# Patient Record
Sex: Female | Born: 1940 | Race: White | Hispanic: No | Smoking: Never smoker
Health system: Southern US, Community
[De-identification: ages and names within clinical notes are randomized; demographics above are authoritative.]

## PROBLEM LIST (undated history)

## (undated) DIAGNOSIS — I10 Essential (primary) hypertension: Secondary | ICD-10-CM

## (undated) DIAGNOSIS — G4730 Sleep apnea, unspecified: Secondary | ICD-10-CM

## (undated) DIAGNOSIS — F329 Major depressive disorder, single episode, unspecified: Secondary | ICD-10-CM

## (undated) DIAGNOSIS — E039 Hypothyroidism, unspecified: Secondary | ICD-10-CM

## (undated) DIAGNOSIS — I509 Heart failure, unspecified: Secondary | ICD-10-CM

## (undated) DIAGNOSIS — M1990 Unspecified osteoarthritis, unspecified site: Secondary | ICD-10-CM

## (undated) DIAGNOSIS — Z9981 Dependence on supplemental oxygen: Secondary | ICD-10-CM

## (undated) DIAGNOSIS — F419 Anxiety disorder, unspecified: Secondary | ICD-10-CM

## (undated) DIAGNOSIS — D649 Anemia, unspecified: Secondary | ICD-10-CM

## (undated) DIAGNOSIS — E119 Type 2 diabetes mellitus without complications: Secondary | ICD-10-CM

## (undated) DIAGNOSIS — M109 Gout, unspecified: Secondary | ICD-10-CM

## (undated) DIAGNOSIS — M3500 Sicca syndrome, unspecified: Secondary | ICD-10-CM

## (undated) DIAGNOSIS — G2581 Restless legs syndrome: Secondary | ICD-10-CM

## (undated) DIAGNOSIS — F32A Depression, unspecified: Secondary | ICD-10-CM

## (undated) DIAGNOSIS — G629 Polyneuropathy, unspecified: Secondary | ICD-10-CM

## (undated) DIAGNOSIS — N189 Chronic kidney disease, unspecified: Secondary | ICD-10-CM

## (undated) HISTORY — PX: CHOLECYSTECTOMY: SHX55

## (undated) HISTORY — PX: FOOT SURGERY: SHX648

## (undated) HISTORY — PX: BACK SURGERY: SHX140

---

## 2006-10-03 ENCOUNTER — Other Ambulatory Visit: Payer: Self-pay

## 2006-10-03 ENCOUNTER — Inpatient Hospital Stay: Payer: Self-pay | Admitting: Internal Medicine

## 2007-03-02 ENCOUNTER — Ambulatory Visit: Payer: Self-pay | Admitting: Oncology

## 2007-03-08 LAB — CBC WITH DIFFERENTIAL (CANCER CENTER ONLY)
BASO%: 0.3 % (ref 0.0–2.0)
LYMPH#: 1.1 10*3/uL (ref 0.9–3.3)
LYMPH%: 16.8 % (ref 14.0–48.0)
MONO#: 0.3 10*3/uL (ref 0.1–0.9)
Platelets: 353 10*3/uL (ref 145–400)
RDW: 13 % (ref 10.5–14.6)
WBC: 6.3 10*3/uL (ref 3.9–10.0)

## 2007-03-10 LAB — COMPREHENSIVE METABOLIC PANEL
ALT: 14 U/L (ref 0–35)
Albumin: 3.7 g/dL (ref 3.5–5.2)
CO2: 27 mEq/L (ref 19–32)
Calcium: 9.9 mg/dL (ref 8.4–10.5)
Chloride: 105 mEq/L (ref 96–112)
Creatinine, Ser: 1.58 mg/dL — ABNORMAL HIGH (ref 0.40–1.20)
Potassium: 5.1 mEq/L (ref 3.5–5.3)

## 2007-03-10 LAB — BETA 2 MICROGLOBULIN, SERUM: Beta-2 Microglobulin: 3.96 mg/L — ABNORMAL HIGH (ref 1.01–1.73)

## 2007-03-10 LAB — RETICULOCYTES (CHCC)
ABS Retic: 21.7 10*3/uL (ref 19.0–186.0)
RBC.: 3.62 MIL/uL — ABNORMAL LOW (ref 3.87–5.11)

## 2007-03-10 LAB — CEA: CEA: 3.4 ng/mL (ref 0.0–5.0)

## 2007-03-10 LAB — PROTEIN ELECTROPHORESIS, SERUM
Albumin ELP: 54.8 % — ABNORMAL LOW (ref 55.8–66.1)
Alpha-1-Globulin: 4 % (ref 2.9–4.9)
Gamma Globulin: 18 % (ref 11.1–18.8)

## 2007-03-10 LAB — IRON AND TIBC: %SAT: 14 % — ABNORMAL LOW (ref 20–55)

## 2007-03-10 LAB — FOLATE: Folate: >20 ng/mL

## 2007-03-10 LAB — LACTATE DEHYDROGENASE: LDH: 168 U/L (ref 94–250)

## 2007-03-10 LAB — SEDIMENTATION RATE: Sed Rate: 38 mm/hr — ABNORMAL HIGH (ref 0–22)

## 2007-03-10 LAB — FERRITIN: Ferritin: 58 ng/mL (ref 10–291)

## 2007-03-10 LAB — VITAMIN B12: Vitamin B-12: 332 pg/mL (ref 211–911)

## 2007-03-17 ENCOUNTER — Ambulatory Visit: Payer: Self-pay | Admitting: Oncology

## 2007-03-22 ENCOUNTER — Ambulatory Visit: Payer: Self-pay | Admitting: Pain Medicine

## 2007-03-24 ENCOUNTER — Ambulatory Visit: Payer: Self-pay | Admitting: Nephrology

## 2007-03-24 ENCOUNTER — Ambulatory Visit: Payer: Self-pay | Admitting: Ophthalmology

## 2007-03-29 ENCOUNTER — Encounter (HOSPITAL_COMMUNITY): Admission: RE | Admit: 2007-03-29 | Discharge: 2007-06-13 | Payer: Self-pay | Admitting: Oncology

## 2007-03-29 ENCOUNTER — Ambulatory Visit: Payer: Self-pay | Admitting: Pain Medicine

## 2007-03-29 LAB — CBC WITH DIFFERENTIAL (CANCER CENTER ONLY)
BASO#: 0 10*3/uL (ref 0.0–0.2)
EOS%: 2.7 % (ref 0.0–7.0)
HGB: 10.1 g/dL — ABNORMAL LOW (ref 11.6–15.9)
MCH: 29.4 pg (ref 26.0–34.0)
MCHC: 33.8 g/dL (ref 32.0–36.0)
MONO%: 6.6 % (ref 0.0–13.0)
NEUT#: 3.2 10*3/uL (ref 1.5–6.5)
Platelets: 426 10*3/uL — ABNORMAL HIGH (ref 145–400)

## 2007-03-29 LAB — BASIC METABOLIC PANEL - CANCER CENTER ONLY
CO2: 33 mEq/L (ref 18–33)
Calcium: 10.3 mg/dL (ref 8.0–10.3)
Creat: 1.9 mg/dl — ABNORMAL HIGH (ref 0.6–1.2)
Glucose, Bld: 91 mg/dL (ref 73–118)
Sodium: 138 mEq/L (ref 128–145)

## 2007-04-04 LAB — TYPE & CROSSMATCH - CHCC SATELLITE

## 2007-04-10 ENCOUNTER — Ambulatory Visit: Payer: Self-pay | Admitting: Pain Medicine

## 2007-04-18 ENCOUNTER — Ambulatory Visit: Payer: Self-pay | Admitting: Oncology

## 2007-04-19 LAB — CBC WITH DIFFERENTIAL (CANCER CENTER ONLY)
BASO%: 0.5 % (ref 0.0–2.0)
EOS%: 3.2 % (ref 0.0–7.0)
Eosinophils Absolute: 0.2 10*3/uL (ref 0.0–0.5)
MCH: 29 pg (ref 26.0–34.0)
MONO%: 6.3 % (ref 0.0–13.0)
NEUT#: 3.4 10*3/uL (ref 1.5–6.5)
Platelets: 341 10*3/uL (ref 145–400)
RBC: 4.24 10*6/uL (ref 3.70–5.32)
RDW: 13.3 % (ref 10.5–14.6)

## 2007-04-19 LAB — BASIC METABOLIC PANEL - CANCER CENTER ONLY
BUN, Bld: 21 mg/dL (ref 7–22)
Calcium: 9.8 mg/dL (ref 8.0–10.3)
Chloride: 102 mEq/L (ref 98–108)
Creat: 1.6 mg/dl — ABNORMAL HIGH (ref 0.6–1.2)

## 2007-04-27 LAB — CBC WITH DIFFERENTIAL (CANCER CENTER ONLY)
BASO%: 0.7 % (ref 0.0–2.0)
Eosinophils Absolute: 0.1 10*3/uL (ref 0.0–0.5)
LYMPH#: 2 10*3/uL (ref 0.9–3.3)
MCV: 88 fL (ref 81–101)
MONO#: 0.3 10*3/uL (ref 0.1–0.9)
NEUT#: 3.1 10*3/uL (ref 1.5–6.5)
Platelets: 393 10*3/uL (ref 145–400)
RBC: 4.41 10*6/uL (ref 3.70–5.32)
WBC: 5.5 10*3/uL (ref 3.9–10.0)

## 2007-05-01 ENCOUNTER — Other Ambulatory Visit: Payer: Self-pay

## 2007-05-01 ENCOUNTER — Ambulatory Visit: Payer: Self-pay | Admitting: Pain Medicine

## 2007-05-18 LAB — CBC WITH DIFFERENTIAL (CANCER CENTER ONLY)
Eosinophils Absolute: 0.2 10*3/uL (ref 0.0–0.5)
HCT: 32.3 % — ABNORMAL LOW (ref 34.8–46.6)
HGB: 10.7 g/dL — ABNORMAL LOW (ref 11.6–15.9)
LYMPH#: 2 10*3/uL (ref 0.9–3.3)
LYMPH%: 37.1 % (ref 14.0–48.0)
MCV: 87 fL (ref 81–101)
MONO#: 0.4 10*3/uL (ref 0.1–0.9)
NEUT%: 50.7 % (ref 39.6–80.0)
RBC: 3.7 10*6/uL (ref 3.70–5.32)
WBC: 5.5 10*3/uL (ref 3.9–10.0)

## 2007-05-25 ENCOUNTER — Ambulatory Visit: Payer: Self-pay | Admitting: Pain Medicine

## 2007-06-07 ENCOUNTER — Ambulatory Visit: Payer: Self-pay | Admitting: Oncology

## 2007-06-14 ENCOUNTER — Ambulatory Visit: Payer: Self-pay | Admitting: Physician Assistant

## 2007-06-29 ENCOUNTER — Ambulatory Visit: Payer: Self-pay | Admitting: Physician Assistant

## 2007-07-02 ENCOUNTER — Emergency Department: Payer: Self-pay | Admitting: Emergency Medicine

## 2007-07-12 LAB — COMPREHENSIVE METABOLIC PANEL
ALT: <8 U/L (ref 0–35)
AST: 10 U/L (ref 0–37)
Albumin: 4.1 g/dL (ref 3.5–5.2)
Alkaline Phosphatase: 64 U/L (ref 39–117)
Calcium: 9 mg/dL (ref 8.4–10.5)
Chloride: 102 mEq/L (ref 96–112)
Potassium: 4.4 mEq/L (ref 3.5–5.3)
Sodium: 140 mEq/L (ref 135–145)
Total Protein: 7 g/dL (ref 6.0–8.3)

## 2007-07-12 LAB — CBC WITH DIFFERENTIAL (CANCER CENTER ONLY)
BASO%: 0.3 % (ref 0.0–2.0)
HCT: 30.8 % — ABNORMAL LOW (ref 34.8–46.6)
HGB: 10.4 g/dL — ABNORMAL LOW (ref 11.6–15.9)
LYMPH#: 1.9 10*3/uL (ref 0.9–3.3)
MONO#: 0.4 10*3/uL (ref 0.1–0.9)
NEUT%: 69 % (ref 39.6–80.0)
RDW: 14.5 % (ref 10.5–14.6)
WBC: 8.1 10*3/uL (ref 3.9–10.0)

## 2007-07-26 ENCOUNTER — Ambulatory Visit: Payer: Self-pay | Admitting: Physician Assistant

## 2007-08-01 ENCOUNTER — Ambulatory Visit: Payer: Self-pay | Admitting: Oncology

## 2007-08-02 LAB — CBC WITH DIFFERENTIAL (CANCER CENTER ONLY)
BASO#: 0 10*3/uL (ref 0.0–0.2)
EOS%: 3.3 % (ref 0.0–7.0)
HCT: 34.4 % — ABNORMAL LOW (ref 34.8–46.6)
HGB: 11.2 g/dL — ABNORMAL LOW (ref 11.6–15.9)
MCH: 28.7 pg (ref 26.0–34.0)
MCHC: 32.5 g/dL (ref 32.0–36.0)
MONO%: 6.2 % (ref 0.0–13.0)
NEUT%: 46.6 % (ref 39.6–80.0)

## 2007-08-23 LAB — CBC WITH DIFFERENTIAL (CANCER CENTER ONLY)
BASO#: 0 10*3/uL (ref 0.0–0.2)
BASO%: 0.5 % (ref 0.0–2.0)
EOS%: 3.9 % (ref 0.0–7.0)
LYMPH#: 1.6 10*3/uL (ref 0.9–3.3)
MCH: 28.4 pg (ref 26.0–34.0)
MCHC: 32 g/dL (ref 32.0–36.0)
MONO%: 8 % (ref 0.0–13.0)
NEUT#: 2.8 10*3/uL (ref 1.5–6.5)
Platelets: 290 10*3/uL (ref 145–400)
RDW: 14.5 % (ref 10.5–14.6)

## 2007-08-28 ENCOUNTER — Ambulatory Visit: Payer: Self-pay | Admitting: Physician Assistant

## 2007-09-09 ENCOUNTER — Ambulatory Visit: Payer: Self-pay | Admitting: Pain Medicine

## 2007-09-13 LAB — CBC WITH DIFFERENTIAL (CANCER CENTER ONLY)
BASO#: 0 10*3/uL (ref 0.0–0.2)
Eosinophils Absolute: 0.1 10*3/uL (ref 0.0–0.5)
HGB: 11.5 g/dL — ABNORMAL LOW (ref 11.6–15.9)
LYMPH#: 2.3 10*3/uL (ref 0.9–3.3)
MCH: 28.8 pg (ref 26.0–34.0)
MONO%: 6.8 % (ref 0.0–13.0)
NEUT#: 3.9 10*3/uL (ref 1.5–6.5)
Platelets: 329 10*3/uL (ref 145–400)
RBC: 3.99 10*6/uL (ref 3.70–5.32)
WBC: 6.9 10*3/uL (ref 3.9–10.0)

## 2007-09-22 ENCOUNTER — Ambulatory Visit: Payer: Self-pay | Admitting: Physician Assistant

## 2007-10-03 ENCOUNTER — Ambulatory Visit: Payer: Self-pay | Admitting: Oncology

## 2007-10-04 LAB — CBC WITH DIFFERENTIAL (CANCER CENTER ONLY)
BASO#: 0 10*3/uL (ref 0.0–0.2)
Eosinophils Absolute: 0.1 10*3/uL (ref 0.0–0.5)
HGB: 12.2 g/dL (ref 11.6–15.9)
MCV: 88 fL (ref 81–101)
MONO#: 0.4 10*3/uL (ref 0.1–0.9)
NEUT#: 3.3 10*3/uL (ref 1.5–6.5)
Platelets: 280 10*3/uL (ref 145–400)
RBC: 4.23 10*6/uL (ref 3.70–5.32)
WBC: 5 10*3/uL (ref 3.9–10.0)

## 2007-10-09 ENCOUNTER — Encounter: Payer: Self-pay | Admitting: Physician Assistant

## 2007-10-09 ENCOUNTER — Ambulatory Visit: Payer: Self-pay | Admitting: Pain Medicine

## 2007-10-10 ENCOUNTER — Ambulatory Visit: Payer: Self-pay | Admitting: Pain Medicine

## 2007-10-24 LAB — CBC WITH DIFFERENTIAL (CANCER CENTER ONLY)
BASO#: 0 10*3/uL (ref 0.0–0.2)
Eosinophils Absolute: 0.1 10*3/uL (ref 0.0–0.5)
HCT: 35.9 % (ref 34.8–46.6)
LYMPH%: 29.7 % (ref 14.0–48.0)
MCH: 28.7 pg (ref 26.0–34.0)
MCV: 87 fL (ref 81–101)
MONO#: 0.2 10*3/uL (ref 0.1–0.9)
NEUT%: 65.2 % (ref 39.6–80.0)
RBC: 4.12 10*6/uL (ref 3.70–5.32)
WBC: 5.6 10*3/uL (ref 3.9–10.0)

## 2007-10-24 LAB — IRON AND TIBC: TIBC: 256 ug/dL (ref 250–470)

## 2007-10-25 ENCOUNTER — Ambulatory Visit: Payer: Self-pay | Admitting: Physician Assistant

## 2007-11-16 LAB — CBC WITH DIFFERENTIAL (CANCER CENTER ONLY)
BASO#: 0 10*3/uL (ref 0.0–0.2)
BASO%: 0.5 % (ref 0.0–2.0)
HCT: 34.5 % — ABNORMAL LOW (ref 34.8–46.6)
HGB: 11.4 g/dL — ABNORMAL LOW (ref 11.6–15.9)
LYMPH#: 1.9 10*3/uL (ref 0.9–3.3)
LYMPH%: 31.1 % (ref 14.0–48.0)
MCHC: 33.1 g/dL (ref 32.0–36.0)
MCV: 87 fL (ref 81–101)
MONO#: 0.5 10*3/uL (ref 0.1–0.9)
NEUT%: 59.3 % (ref 39.6–80.0)
RDW: 14.1 % (ref 10.5–14.6)

## 2007-11-21 ENCOUNTER — Ambulatory Visit: Payer: Self-pay | Admitting: Pain Medicine

## 2007-12-01 ENCOUNTER — Ambulatory Visit: Payer: Self-pay | Admitting: Oncology

## 2007-12-05 ENCOUNTER — Ambulatory Visit: Payer: Self-pay | Admitting: Pain Medicine

## 2007-12-06 LAB — CBC WITH DIFFERENTIAL (CANCER CENTER ONLY)
BASO#: 0 10*3/uL (ref 0.0–0.2)
BASO%: 0.2 % (ref 0.0–2.0)
EOS%: 0.7 % (ref 0.0–7.0)
HCT: 33.5 % — ABNORMAL LOW (ref 34.8–46.6)
HGB: 11 g/dL — ABNORMAL LOW (ref 11.6–15.9)
LYMPH#: 1 10*3/uL (ref 0.9–3.3)
LYMPH%: 14.5 % (ref 14.0–48.0)
MCH: 29.3 pg (ref 26.0–34.0)
MCHC: 32.9 g/dL (ref 32.0–36.0)
MONO%: 5.1 % (ref 0.0–13.0)
NEUT%: 79.5 % (ref 39.6–80.0)
RDW: 16.2 % — ABNORMAL HIGH (ref 10.5–14.6)

## 2007-12-19 ENCOUNTER — Ambulatory Visit: Payer: Self-pay | Admitting: Gastroenterology

## 2007-12-20 ENCOUNTER — Ambulatory Visit: Payer: Self-pay | Admitting: Physician Assistant

## 2007-12-28 LAB — CBC WITH DIFFERENTIAL (CANCER CENTER ONLY)
BASO%: 0.5 % (ref 0.0–2.0)
EOS%: 1 % (ref 0.0–7.0)
LYMPH%: 33.3 % (ref 14.0–48.0)
MCH: 30 pg (ref 26.0–34.0)
MCV: 90 fL (ref 81–101)
MONO%: 5.4 % (ref 0.0–13.0)
Platelets: 369 10*3/uL (ref 145–400)
RDW: 15 % — ABNORMAL HIGH (ref 10.5–14.6)

## 2008-01-12 ENCOUNTER — Ambulatory Visit: Payer: Self-pay | Admitting: Oncology

## 2008-01-17 LAB — CBC WITH DIFFERENTIAL (CANCER CENTER ONLY)
BASO#: 0 10*3/uL (ref 0.0–0.2)
Eosinophils Absolute: 0.1 10*3/uL (ref 0.0–0.5)
HCT: 30.4 % — ABNORMAL LOW (ref 34.8–46.6)
HGB: 10.1 g/dL — ABNORMAL LOW (ref 11.6–15.9)
MCH: 29.6 pg (ref 26.0–34.0)
MCV: 90 fL (ref 81–101)
MONO%: 6 % (ref 0.0–13.0)
NEUT#: 6.2 10*3/uL (ref 1.5–6.5)
NEUT%: 71.5 % (ref 39.6–80.0)
RBC: 3.39 10*6/uL — ABNORMAL LOW (ref 3.70–5.32)

## 2008-01-25 ENCOUNTER — Ambulatory Visit: Payer: Self-pay | Admitting: Physician Assistant

## 2008-02-01 ENCOUNTER — Ambulatory Visit: Payer: Self-pay | Admitting: Pain Medicine

## 2008-02-07 LAB — CBC WITH DIFFERENTIAL (CANCER CENTER ONLY)
BASO%: 0.5 % (ref 0.0–2.0)
EOS%: 1.3 % (ref 0.0–7.0)
HCT: 38 % (ref 34.8–46.6)
LYMPH#: 1.4 10*3/uL (ref 0.9–3.3)
LYMPH%: 14.9 % (ref 14.0–48.0)
MCHC: 33 g/dL (ref 32.0–36.0)
MCV: 90 fL (ref 81–101)
NEUT%: 79 % (ref 39.6–80.0)
Platelets: 340 10*3/uL (ref 145–400)
RDW: 14 % (ref 10.5–14.6)

## 2008-02-15 ENCOUNTER — Ambulatory Visit: Payer: Self-pay | Admitting: Physician Assistant

## 2008-02-26 ENCOUNTER — Ambulatory Visit: Payer: Self-pay | Admitting: Oncology

## 2008-02-27 LAB — CBC WITH DIFFERENTIAL (CANCER CENTER ONLY)
BASO#: 0.1 10*3/uL (ref 0.0–0.2)
Eosinophils Absolute: 0.1 10*3/uL (ref 0.0–0.5)
HGB: 10.6 g/dL — ABNORMAL LOW (ref 11.6–15.9)
LYMPH%: 9 % — ABNORMAL LOW (ref 14.0–48.0)
MCH: 29.3 pg (ref 26.0–34.0)
MCHC: 33.4 g/dL (ref 32.0–36.0)
MCV: 88 fL (ref 81–101)
MONO%: 3.5 % (ref 0.0–13.0)
Platelets: 292 10*3/uL (ref 145–400)
RBC: 3.6 10*6/uL — ABNORMAL LOW (ref 3.70–5.32)

## 2008-02-28 ENCOUNTER — Inpatient Hospital Stay: Payer: Self-pay | Admitting: Internal Medicine

## 2008-02-28 ENCOUNTER — Other Ambulatory Visit: Payer: Self-pay

## 2008-03-28 ENCOUNTER — Ambulatory Visit: Payer: Self-pay | Admitting: Pain Medicine

## 2008-04-02 ENCOUNTER — Ambulatory Visit: Payer: Self-pay | Admitting: Internal Medicine

## 2008-04-10 LAB — CBC WITH DIFFERENTIAL (CANCER CENTER ONLY)
BASO#: 0 10*3/uL (ref 0.0–0.2)
Eosinophils Absolute: 0.1 10*3/uL (ref 0.0–0.5)
HGB: 10.4 g/dL — ABNORMAL LOW (ref 11.6–15.9)
MCH: 29.1 pg (ref 26.0–34.0)
MONO%: 7 % (ref 0.0–13.0)
NEUT#: 4.4 10*3/uL (ref 1.5–6.5)
RBC: 3.58 10*6/uL — ABNORMAL LOW (ref 3.70–5.32)

## 2008-04-29 ENCOUNTER — Ambulatory Visit: Payer: Self-pay | Admitting: Oncology

## 2008-04-30 ENCOUNTER — Ambulatory Visit: Payer: Self-pay | Admitting: Physician Assistant

## 2008-05-01 LAB — BASIC METABOLIC PANEL
BUN: 32 mg/dL — ABNORMAL HIGH (ref 6–23)
Calcium: 9.3 mg/dL (ref 8.4–10.5)
Glucose, Bld: 58 mg/dL — ABNORMAL LOW (ref 70–99)
Potassium: 3.9 mEq/L (ref 3.5–5.3)

## 2008-05-01 LAB — CBC WITH DIFFERENTIAL (CANCER CENTER ONLY)
BASO#: 0 10*3/uL (ref 0.0–0.2)
EOS%: 1.4 % (ref 0.0–7.0)
Eosinophils Absolute: 0.1 10*3/uL (ref 0.0–0.5)
HGB: 10.8 g/dL — ABNORMAL LOW (ref 11.6–15.9)
LYMPH#: 0.9 10*3/uL (ref 0.9–3.3)
MCHC: 33.5 g/dL (ref 32.0–36.0)
NEUT#: 3.5 10*3/uL (ref 1.5–6.5)
RBC: 3.65 10*6/uL — ABNORMAL LOW (ref 3.70–5.32)
WBC: 4.8 10*3/uL (ref 3.9–10.0)

## 2008-05-01 LAB — IRON AND TIBC: Iron: 28 ug/dL — ABNORMAL LOW (ref 42–145)

## 2008-05-01 LAB — FERRITIN: Ferritin: 205 ng/mL (ref 10–291)

## 2008-05-22 LAB — CBC WITH DIFFERENTIAL (CANCER CENTER ONLY)
BASO#: 0 10*3/uL (ref 0.0–0.2)
BASO%: 0.6 % (ref 0.0–2.0)
Eosinophils Absolute: 0.1 10*3/uL (ref 0.0–0.5)
HCT: 34.3 % — ABNORMAL LOW (ref 34.8–46.6)
HGB: 11.1 g/dL — ABNORMAL LOW (ref 11.6–15.9)
LYMPH%: 33.2 % (ref 14.0–48.0)
MCH: 28.7 pg (ref 26.0–34.0)
MCV: 89 fL (ref 81–101)
MONO%: 10 % (ref 0.0–13.0)
NEUT%: 54.8 % (ref 39.6–80.0)
Platelets: 303 10*3/uL (ref 145–400)
RBC: 3.86 10*6/uL (ref 3.70–5.32)

## 2008-05-30 ENCOUNTER — Ambulatory Visit: Payer: Self-pay | Admitting: Physician Assistant

## 2008-06-04 ENCOUNTER — Ambulatory Visit: Payer: Self-pay | Admitting: Pain Medicine

## 2008-06-06 ENCOUNTER — Ambulatory Visit: Payer: Self-pay | Admitting: Pain Medicine

## 2008-06-07 ENCOUNTER — Ambulatory Visit: Payer: Self-pay | Admitting: Pain Medicine

## 2008-06-12 LAB — CBC WITH DIFFERENTIAL (CANCER CENTER ONLY)
BASO%: 0.4 % (ref 0.0–2.0)
Eosinophils Absolute: 0.1 10*3/uL (ref 0.0–0.5)
HCT: 35.9 % (ref 34.8–46.6)
HGB: 11.7 g/dL (ref 11.6–15.9)
LYMPH#: 1.8 10*3/uL (ref 0.9–3.3)
MONO#: 0.3 10*3/uL (ref 0.1–0.9)
NEUT%: 67.8 % (ref 39.6–80.0)
RBC: 4.09 10*6/uL (ref 3.70–5.32)
RDW: 14.4 % (ref 10.5–14.6)
WBC: 7.1 10*3/uL (ref 3.9–10.0)

## 2008-06-19 ENCOUNTER — Ambulatory Visit: Payer: Self-pay | Admitting: Physician Assistant

## 2008-07-02 ENCOUNTER — Ambulatory Visit: Payer: Self-pay | Admitting: Oncology

## 2008-07-04 LAB — CBC WITH DIFFERENTIAL (CANCER CENTER ONLY)
BASO#: 0 10*3/uL (ref 0.0–0.2)
EOS%: 1.6 % (ref 0.0–7.0)
Eosinophils Absolute: 0.1 10*3/uL (ref 0.0–0.5)
HCT: 34.8 % (ref 34.8–46.6)
HGB: 11.2 g/dL — ABNORMAL LOW (ref 11.6–15.9)
LYMPH#: 1.4 10*3/uL (ref 0.9–3.3)
MONO#: 0.3 10*3/uL (ref 0.1–0.9)
NEUT#: 3.4 10*3/uL (ref 1.5–6.5)
NEUT%: 65.4 % (ref 39.6–80.0)
RBC: 3.99 10*6/uL (ref 3.70–5.32)
WBC: 5.2 10*3/uL (ref 3.9–10.0)

## 2008-07-24 LAB — CBC WITH DIFFERENTIAL (CANCER CENTER ONLY)
BASO#: 0 10*3/uL (ref 0.0–0.2)
EOS%: 1.8 % (ref 0.0–7.0)
HCT: 35.5 % (ref 34.8–46.6)
HGB: 11.6 g/dL (ref 11.6–15.9)
LYMPH#: 1.6 10*3/uL (ref 0.9–3.3)
MCHC: 32.8 g/dL (ref 32.0–36.0)
MCV: 84 fL (ref 81–101)
MONO#: 0.4 10*3/uL (ref 0.1–0.9)
NEUT%: 63.8 % (ref 39.6–80.0)

## 2008-07-31 ENCOUNTER — Ambulatory Visit: Payer: Self-pay | Admitting: Physician Assistant

## 2008-08-14 LAB — CBC WITH DIFFERENTIAL/PLATELET
BASO%: 0.6 % (ref 0.0–2.0)
Basophils Absolute: 0.1 10*3/uL (ref 0.0–0.1)
EOS%: 0 % (ref 0.0–7.0)
Eosinophils Absolute: 0 10*3/uL (ref 0.0–0.5)
HCT: 37.2 % (ref 34.8–46.6)
HGB: 11.7 g/dL (ref 11.6–15.9)
LYMPH%: 26.2 % (ref 14.0–48.0)
MCH: 27.1 pg (ref 26.0–34.0)
MCHC: 31.5 g/dL — ABNORMAL LOW (ref 32.0–36.0)
MCV: 86.1 fL (ref 81.0–101.0)
MONO#: 0.8 10*3/uL (ref 0.1–0.9)
MONO%: 8.6 % (ref 0.0–13.0)
NEUT#: 6.1 10*3/uL (ref 1.5–6.5)
NEUT%: 64.6 % (ref 39.6–76.8)
Platelets: 320 10*3/uL (ref 145–400)
RBC: 4.32 10*6/uL (ref 3.70–5.32)
RDW: 18.4 % — ABNORMAL HIGH (ref 11.3–14.5)
WBC: 9.4 10*3/uL (ref 3.9–10.0)
lymph#: 2.5 10*3/uL (ref 0.9–3.3)

## 2008-09-03 ENCOUNTER — Ambulatory Visit: Payer: Self-pay | Admitting: Oncology

## 2008-09-04 LAB — CBC WITH DIFFERENTIAL (CANCER CENTER ONLY)
BASO%: 0.5 % (ref 0.0–2.0)
EOS%: 2.8 % (ref 0.0–7.0)
LYMPH#: 1.6 10*3/uL (ref 0.9–3.3)
MCHC: 32.6 g/dL (ref 32.0–36.0)
NEUT#: 2.9 10*3/uL (ref 1.5–6.5)
RDW: 15.4 % — ABNORMAL HIGH (ref 10.5–14.6)

## 2008-09-25 LAB — CBC WITH DIFFERENTIAL (CANCER CENTER ONLY)
BASO%: 0.6 % (ref 0.0–2.0)
Eosinophils Absolute: 0.1 10*3/uL (ref 0.0–0.5)
LYMPH#: 1.9 10*3/uL (ref 0.9–3.3)
LYMPH%: 27.4 % (ref 14.0–48.0)
MONO#: 0.5 10*3/uL (ref 0.1–0.9)
NEUT#: 4.4 10*3/uL (ref 1.5–6.5)
Platelets: 283 10*3/uL (ref 145–400)
RBC: 4.02 10*6/uL (ref 3.70–5.32)
RDW: 17.1 % — ABNORMAL HIGH (ref 10.5–14.6)
WBC: 6.9 10*3/uL (ref 3.9–10.0)

## 2008-10-01 ENCOUNTER — Ambulatory Visit: Payer: Self-pay | Admitting: Physician Assistant

## 2008-10-16 LAB — CBC WITH DIFFERENTIAL (CANCER CENTER ONLY)
BASO#: 0 10*3/uL (ref 0.0–0.2)
EOS%: 1.8 % (ref 0.0–7.0)
Eosinophils Absolute: 0.1 10*3/uL (ref 0.0–0.5)
LYMPH%: 31.1 % (ref 14.0–48.0)
MCH: 26.8 pg (ref 26.0–34.0)
MCHC: 32 g/dL (ref 32.0–36.0)
MCV: 84 fL (ref 81–101)
MONO%: 8.5 % (ref 0.0–13.0)
NEUT#: 3.2 10*3/uL (ref 1.5–6.5)
Platelets: 260 10*3/uL (ref 145–400)
RBC: 4.46 10*6/uL (ref 3.70–5.32)

## 2008-11-05 ENCOUNTER — Ambulatory Visit: Payer: Self-pay | Admitting: Oncology

## 2008-11-15 LAB — CBC WITH DIFFERENTIAL (CANCER CENTER ONLY)
BASO#: 0 10*3/uL (ref 0.0–0.2)
Eosinophils Absolute: 0.2 10*3/uL (ref 0.0–0.5)
HCT: 33.6 % — ABNORMAL LOW (ref 34.8–46.6)
HGB: 10.9 g/dL — ABNORMAL LOW (ref 11.6–15.9)
LYMPH#: 1.8 10*3/uL (ref 0.9–3.3)
MCHC: 32.4 g/dL (ref 32.0–36.0)
MONO#: 0.4 10*3/uL (ref 0.1–0.9)
NEUT#: 3.4 10*3/uL (ref 1.5–6.5)
NEUT%: 58.3 % (ref 39.6–80.0)
RBC: 4.03 10*6/uL (ref 3.70–5.32)
WBC: 5.9 10*3/uL (ref 3.9–10.0)

## 2008-11-15 LAB — BASIC METABOLIC PANEL - CANCER CENTER ONLY
CO2: 31 mEq/L (ref 18–33)
Calcium: 9.9 mg/dL (ref 8.0–10.3)
Creat: 2.1 mg/dl — ABNORMAL HIGH (ref 0.6–1.2)
Glucose, Bld: 97 mg/dL (ref 73–118)

## 2008-11-28 ENCOUNTER — Ambulatory Visit: Payer: Self-pay | Admitting: Gastroenterology

## 2008-12-05 LAB — CBC WITH DIFFERENTIAL (CANCER CENTER ONLY)
BASO%: 0.6 % (ref 0.0–2.0)
LYMPH%: 39.4 % (ref 14.0–48.0)
MCH: 26.6 pg (ref 26.0–34.0)
MCV: 84 fL (ref 81–101)
MONO%: 7.1 % (ref 0.0–13.0)
NEUT#: 2.3 10*3/uL (ref 1.5–6.5)
Platelets: 421 10*3/uL — ABNORMAL HIGH (ref 145–400)
RBC: 4.07 10*6/uL (ref 3.70–5.32)
RDW: 14.2 % (ref 10.5–14.6)
WBC: 4.6 10*3/uL (ref 3.9–10.0)

## 2008-12-10 ENCOUNTER — Ambulatory Visit: Payer: Self-pay | Admitting: Physician Assistant

## 2008-12-25 ENCOUNTER — Ambulatory Visit: Payer: Self-pay | Admitting: Oncology

## 2008-12-26 LAB — CBC WITH DIFFERENTIAL (CANCER CENTER ONLY)
BASO#: 0 10*3/uL (ref 0.0–0.2)
EOS%: 4.1 % (ref 0.0–7.0)
Eosinophils Absolute: 0.3 10*3/uL (ref 0.0–0.5)
HCT: 36.7 % (ref 34.8–46.6)
HGB: 11.8 g/dL (ref 11.6–15.9)
MCH: 26.7 pg (ref 26.0–34.0)
MCHC: 32.1 g/dL (ref 32.0–36.0)
MCV: 83 fL (ref 81–101)
MONO%: 6.2 % (ref 0.0–13.0)
NEUT%: 66 % (ref 39.6–80.0)
RBC: 4.4 10*6/uL (ref 3.70–5.32)

## 2009-01-16 LAB — CBC WITH DIFFERENTIAL (CANCER CENTER ONLY)
BASO%: 0.6 % (ref 0.0–2.0)
HCT: 37.2 % (ref 34.8–46.6)
HGB: 11.8 g/dL (ref 11.6–15.9)
LYMPH#: 2.1 10*3/uL (ref 0.9–3.3)
MONO#: 0.4 10*3/uL (ref 0.1–0.9)
NEUT#: 2.3 10*3/uL (ref 1.5–6.5)
NEUT%: 46.3 % (ref 39.6–80.0)
RDW: 14.2 % (ref 10.5–14.6)
WBC: 5 10*3/uL (ref 3.9–10.0)

## 2009-02-06 LAB — CBC WITH DIFFERENTIAL (CANCER CENTER ONLY)
BASO%: 0.6 % (ref 0.0–2.0)
LYMPH%: 29.9 % (ref 14.0–48.0)
MCH: 26 pg (ref 26.0–34.0)
MCHC: 32.5 g/dL (ref 32.0–36.0)
MCV: 80 fL — ABNORMAL LOW (ref 81–101)
MONO%: 6 % (ref 0.0–13.0)
Platelets: 248 10*3/uL (ref 145–400)
RDW: 15.5 % — ABNORMAL HIGH (ref 10.5–14.6)

## 2009-02-24 ENCOUNTER — Ambulatory Visit: Payer: Self-pay | Admitting: Oncology

## 2009-02-27 LAB — CBC WITH DIFFERENTIAL (CANCER CENTER ONLY)
BASO%: 0.3 % (ref 0.0–2.0)
EOS%: 1.1 % (ref 0.0–7.0)
LYMPH#: 1.4 10*3/uL (ref 0.9–3.3)
LYMPH%: 25.5 % (ref 14.0–48.0)
MONO#: 0.4 10*3/uL (ref 0.1–0.9)
Platelets: 316 10*3/uL (ref 145–400)
RDW: 17 % — ABNORMAL HIGH (ref 10.5–14.6)
WBC: 5.6 10*3/uL (ref 3.9–10.0)

## 2009-03-03 ENCOUNTER — Ambulatory Visit: Payer: Self-pay | Admitting: Physician Assistant

## 2009-03-24 LAB — CBC WITH DIFFERENTIAL (CANCER CENTER ONLY)
BASO#: 0.1 10*3/uL (ref 0.0–0.2)
Eosinophils Absolute: 0.2 10*3/uL (ref 0.0–0.5)
HGB: 11.8 g/dL (ref 11.6–15.9)
LYMPH%: 32.4 % (ref 14.0–48.0)
MCH: 26.5 pg (ref 26.0–34.0)
MCV: 81 fL (ref 81–101)
MONO%: 4.5 % (ref 0.0–13.0)
NEUT%: 60.2 % (ref 39.6–80.0)
Platelets: 280 10*3/uL (ref 145–400)
RBC: 4.46 10*6/uL (ref 3.70–5.32)

## 2009-03-24 LAB — BASIC METABOLIC PANEL - CANCER CENTER ONLY
BUN, Bld: 34 mg/dL — ABNORMAL HIGH (ref 7–22)
Creat: 1.5 mg/dl — ABNORMAL HIGH (ref 0.6–1.2)
Potassium: 4.2 mEq/L (ref 3.3–4.7)

## 2009-03-24 LAB — FERRITIN: Ferritin: 28 ng/mL (ref 10–291)

## 2009-03-24 LAB — IRON AND TIBC
Iron: 43 ug/dL (ref 42–145)
UIBC: 243 ug/dL

## 2009-04-03 ENCOUNTER — Ambulatory Visit: Payer: Self-pay | Admitting: Physician Assistant

## 2009-04-08 ENCOUNTER — Ambulatory Visit: Payer: Self-pay | Admitting: Oncology

## 2009-04-14 LAB — CBC WITH DIFFERENTIAL (CANCER CENTER ONLY)
BASO%: 0.5 % (ref 0.0–2.0)
EOS%: 3.6 % (ref 0.0–7.0)
HCT: 28.7 % — ABNORMAL LOW (ref 34.8–46.6)
LYMPH%: 39.7 % (ref 14.0–48.0)
MCHC: 33.5 g/dL (ref 32.0–36.0)
MCV: 80 fL — ABNORMAL LOW (ref 81–101)
MONO%: 10.6 % (ref 0.0–13.0)
NEUT%: 45.6 % (ref 39.6–80.0)
Platelets: 396 10*3/uL (ref 145–400)
RDW: 18.5 % — ABNORMAL HIGH (ref 10.5–14.6)

## 2009-05-07 ENCOUNTER — Ambulatory Visit: Payer: Self-pay | Admitting: Oncology

## 2009-05-13 LAB — CBC WITH DIFFERENTIAL (CANCER CENTER ONLY)
Eosinophils Absolute: 0.2 10*3/uL (ref 0.0–0.5)
HCT: 33.4 % — ABNORMAL LOW (ref 34.8–46.6)
LYMPH%: 33 % (ref 14.0–48.0)
MCH: 27.7 pg (ref 26.0–34.0)
MCV: 83 fL (ref 81–101)
MONO#: 0.4 10*3/uL (ref 0.1–0.9)
MONO%: 7.8 % (ref 0.0–13.0)
NEUT%: 54.2 % (ref 39.6–80.0)
RBC: 4.04 10*6/uL (ref 3.70–5.32)
RDW: 17.2 % — ABNORMAL HIGH (ref 10.5–14.6)
WBC: 4.8 10*3/uL (ref 3.9–10.0)

## 2009-06-05 ENCOUNTER — Ambulatory Visit: Payer: Self-pay | Admitting: Oncology

## 2009-06-09 LAB — CBC WITH DIFFERENTIAL (CANCER CENTER ONLY)
BASO#: 0 10*3/uL (ref 0.0–0.2)
Eosinophils Absolute: 0.2 10*3/uL (ref 0.0–0.5)
HGB: 12 g/dL (ref 11.6–15.9)
LYMPH%: 35.3 % (ref 14.0–48.0)
MCH: 27.5 pg (ref 26.0–34.0)
MCV: 83 fL (ref 81–101)
MONO#: 0.3 10*3/uL (ref 0.1–0.9)
MONO%: 4.7 % (ref 0.0–13.0)
NEUT%: 55.1 % (ref 39.6–80.0)
Platelets: 293 10*3/uL (ref 145–400)
RBC: 4.37 10*6/uL (ref 3.70–5.32)
RDW: 16.5 % — ABNORMAL HIGH (ref 10.5–14.6)
WBC: 5.3 10*3/uL (ref 3.9–10.0)

## 2009-07-01 ENCOUNTER — Ambulatory Visit: Payer: Self-pay | Admitting: Oncology

## 2009-07-09 ENCOUNTER — Ambulatory Visit: Payer: Self-pay | Admitting: Physician Assistant

## 2009-07-22 ENCOUNTER — Ambulatory Visit: Payer: Self-pay | Admitting: Pain Medicine

## 2009-07-24 ENCOUNTER — Ambulatory Visit: Payer: Self-pay | Admitting: Oncology

## 2009-08-28 ENCOUNTER — Ambulatory Visit: Payer: Self-pay | Admitting: Oncology

## 2009-09-24 ENCOUNTER — Ambulatory Visit: Payer: Self-pay | Admitting: Oncology

## 2009-11-21 ENCOUNTER — Ambulatory Visit: Payer: Self-pay | Admitting: Ophthalmology

## 2009-12-02 ENCOUNTER — Ambulatory Visit: Payer: Self-pay | Admitting: Ophthalmology

## 2010-02-17 ENCOUNTER — Ambulatory Visit: Payer: Self-pay | Admitting: Ophthalmology

## 2010-05-07 ENCOUNTER — Emergency Department: Payer: Self-pay | Admitting: Emergency Medicine

## 2010-05-19 ENCOUNTER — Ambulatory Visit: Payer: Self-pay

## 2010-11-25 ENCOUNTER — Ambulatory Visit: Payer: Self-pay | Admitting: Pain Medicine

## 2010-12-31 ENCOUNTER — Ambulatory Visit: Payer: Self-pay | Admitting: Pain Medicine

## 2011-01-18 ENCOUNTER — Ambulatory Visit: Payer: Self-pay | Admitting: Pain Medicine

## 2011-02-02 ENCOUNTER — Ambulatory Visit: Payer: Self-pay | Admitting: Pain Medicine

## 2011-02-15 ENCOUNTER — Ambulatory Visit: Payer: Self-pay | Admitting: Pain Medicine

## 2012-12-11 LAB — CBC WITH DIFFERENTIAL/PLATELET
Basophil #: 0 10*3/uL (ref 0.0–0.1)
Basophil %: 0.1 %
Eosinophil %: 0.1 %
HCT: 29.9 % — ABNORMAL LOW (ref 35.0–47.0)
HGB: 9.6 g/dL — ABNORMAL LOW (ref 12.0–16.0)
Lymphocyte %: 6.3 %
MCH: 30.1 pg (ref 26.0–34.0)
MCHC: 32 g/dL (ref 32.0–36.0)
MCV: 94 fL (ref 80–100)
Monocyte %: 7.8 %
Neutrophil #: 19.8 10*3/uL — ABNORMAL HIGH (ref 1.4–6.5)
Neutrophil %: 85.7 %
Platelet: 287 10*3/uL (ref 150–440)
RBC: 3.17 10*6/uL — ABNORMAL LOW (ref 3.80–5.20)
RDW: 14.4 % (ref 11.5–14.5)

## 2012-12-11 LAB — COMPREHENSIVE METABOLIC PANEL
Albumin: 3.4 g/dL (ref 3.4–5.0)
Alkaline Phosphatase: 117 U/L (ref 50–136)
Anion Gap: 10 (ref 7–16)
BUN: 53 mg/dL — ABNORMAL HIGH (ref 7–18)
Bilirubin,Total: 0.5 mg/dL (ref 0.2–1.0)
Chloride: 102 mmol/L (ref 98–107)
EGFR (African American): 19 — ABNORMAL LOW
Glucose: 111 mg/dL — ABNORMAL HIGH (ref 65–99)
Potassium: 4.4 mmol/L (ref 3.5–5.1)
SGOT(AST): 20 U/L (ref 15–37)

## 2012-12-12 ENCOUNTER — Inpatient Hospital Stay: Payer: Self-pay | Admitting: Specialist

## 2012-12-12 LAB — URINALYSIS, COMPLETE
Bilirubin,UR: NEGATIVE
Blood: NEGATIVE
Glucose,UR: NEGATIVE mg/dL (ref 0–75)
Ketone: NEGATIVE
Nitrite: NEGATIVE
Ph: 5 (ref 4.5–8.0)
Specific Gravity: 1.02 (ref 1.003–1.030)
Squamous Epithelial: <1

## 2012-12-12 LAB — FOLATE: Folic Acid: 14.3 ng/mL (ref 3.1–100.0)

## 2012-12-12 LAB — FERRITIN: Ferritin (ARMC): 1172 ng/mL — ABNORMAL HIGH (ref 8–388)

## 2012-12-12 LAB — IRON AND TIBC
Iron Bind.Cap.(Total): 127 ug/dL — ABNORMAL LOW (ref 250–450)
Unbound Iron-Bind.Cap.: 112 ug/dL

## 2012-12-12 LAB — RAPID INFLUENZA A&B ANTIGENS

## 2012-12-12 LAB — RETICULOCYTES: Reticulocyte: 0.9 % (ref 0.5–2.2)

## 2012-12-13 LAB — CBC WITH DIFFERENTIAL/PLATELET
Basophil %: 0.3 %
Eosinophil %: 0 %
HCT: 29.2 % — ABNORMAL LOW (ref 35.0–47.0)
HGB: 9.3 g/dL — ABNORMAL LOW (ref 12.0–16.0)
Lymphocyte #: 1.3 10*3/uL (ref 1.0–3.6)
Neutrophil #: 12.4 10*3/uL — ABNORMAL HIGH (ref 1.4–6.5)
Neutrophil %: 80.2 %
Platelet: 319 10*3/uL (ref 150–440)
RDW: 14.4 % (ref 11.5–14.5)
WBC: 15.5 10*3/uL — ABNORMAL HIGH (ref 3.6–11.0)

## 2012-12-13 LAB — URINE CULTURE

## 2012-12-13 LAB — BASIC METABOLIC PANEL
Anion Gap: 14 (ref 7–16)
BUN: 26 mg/dL — ABNORMAL HIGH (ref 7–18)
Calcium, Total: 9.2 mg/dL (ref 8.5–10.1)
Co2: 18 mmol/L — ABNORMAL LOW (ref 21–32)
Creatinine: 1.42 mg/dL — ABNORMAL HIGH (ref 0.60–1.30)
EGFR (African American): 43 — ABNORMAL LOW
EGFR (Non-African Amer.): 37 — ABNORMAL LOW
Glucose: 61 mg/dL — ABNORMAL LOW (ref 65–99)
Osmolality: 280 (ref 275–301)
Potassium: 3.8 mmol/L (ref 3.5–5.1)
Sodium: 139 mmol/L (ref 136–145)

## 2012-12-14 LAB — BASIC METABOLIC PANEL
Anion Gap: 10 (ref 7–16)
Creatinine: 1.18 mg/dL (ref 0.60–1.30)
EGFR (Non-African Amer.): 46 — ABNORMAL LOW
Glucose: 85 mg/dL (ref 65–99)
Osmolality: 278 (ref 275–301)
Sodium: 139 mmol/L (ref 136–145)

## 2012-12-14 LAB — CBC WITH DIFFERENTIAL/PLATELET
Basophil #: 0.1 10*3/uL (ref 0.0–0.1)
Eosinophil %: 0.3 %
HGB: 9 g/dL — ABNORMAL LOW (ref 12.0–16.0)
Lymphocyte #: 1.5 10*3/uL (ref 1.0–3.6)
Lymphocyte %: 10.6 %
MCH: 30.2 pg (ref 26.0–34.0)
Monocyte #: 1.7 x10 3/mm — ABNORMAL HIGH (ref 0.2–0.9)
Monocyte %: 12.6 %
RBC: 2.99 10*6/uL — ABNORMAL LOW (ref 3.80–5.20)
RDW: 14.1 % (ref 11.5–14.5)
WBC: 13.8 10*3/uL — ABNORMAL HIGH (ref 3.6–11.0)

## 2012-12-17 LAB — CULTURE, BLOOD (SINGLE)

## 2013-03-21 ENCOUNTER — Ambulatory Visit: Payer: Self-pay | Admitting: Pain Medicine

## 2013-03-29 ENCOUNTER — Ambulatory Visit: Payer: Self-pay | Admitting: Pain Medicine

## 2013-05-02 ENCOUNTER — Ambulatory Visit: Payer: Self-pay | Admitting: Pain Medicine

## 2014-05-07 ENCOUNTER — Ambulatory Visit: Payer: Self-pay | Admitting: Family

## 2015-02-05 ENCOUNTER — Ambulatory Visit: Payer: Self-pay | Admitting: Orthopedic Surgery

## 2015-02-28 NOTE — H&P (Signed)
PATIENT NAME:  Chloe Sanchez, KONTZ MR#:  [id number] DATE OF BIRTH:  Jul 19, 1941  DATE OF ADMISSION:  12/12/2012  REFERRING PHYSICIAN:    Rebecka Apley, MD  PRIMARY CARE PHYSICIAN:   She used to see Dr. Ellsworth Lennox, but over the last 2 years, she has been seeing Dr. Lavetta Nielsen.   CHIEF COMPLAINT:  Altered mental status and fever.   HISTORY OF PRESENT ILLNESS:  This is a 74 year old female who lives in assisted living facility with known past medical history of hypertension, hyperlipidemia, diabetes, thyroid disease, gout, arthritis, with chronic pain syndrome on methadone, who presents with altered mental status. The patient was confused.  When paramedics presented to see her roommate, who had an episode of syncope, they noticed her to be confused, so they brought her to ED as well.   In the ED, the patient was found to have fever of 100.5; as well, was found to have significant leukocytosis of 23,000. The patient's chest x-ray did not show any opacity or infiltrate; as well, her urinalysis was negative. The patient did not have any meningeal signs, and  her mentation improved after she was hydrated in the ED. The patient has been reporting cough with a productive sputum over the last 2 days. Reports it is green in color; as well, reports some mild shortness of breath. The patient was hypoxic upon presentation with saturation of 91 on room air in the ED. The patient was started on Levaquin for acute bronchitis; as well, the patient was found to have a creatinine of 2.7. Unfortunately, there is no old value to compare.  The only old record was from [facility] in 2008, where her creatinine was 1.5 in 2008. She does not recall any history of kidney disease, but she reports she has not been feeling well over the last 2 days. She does not have a good appetite. She has not been drinking or eating well. The patient had a bladder scan, which did show around 100cc only in her bladder prior to voiding.  Hospitalist service was requested to admit the patient for further management and workup.   PAST MEDICAL HISTORY: 1.  Hypertension.  2.  Hyperlipidemia.  3.  Diabetes.  4.  Gout.  5.  Arthritis.  6.  Thyroid disease.  7.  Chronic pain syndrome.   PAST SURGICAL HISTORY:  1.  Leg surgery.  2.  Foot surgery.  3.  Back surgery.  4.  Shoulder surgery, bilateral.  5.  Gallbladder removed.   ALLERGIES: No known drug allergies.   SOCIAL HISTORY:  The patient is widowed. No history of tobacco or alcohol use or illicit drug use.   FAMILY HISTORY: Significant for diabetes in her mother.   HOME MEDICATIONS: The patient does not recall her home medication. We tried to obtain by pharmacy technician earlier during the day with no success. Unfortunately, so far, she does not recall any of her medicines.  She knows she is on methadone. She does not recall the dose.   REVIEW OF SYSTEMS: GENERAL:  Complains of fever, chills, fatigue, weakness.  EYES:  Denies blurry vision, double vision, pain, inflammation or glaucoma.  ENT:  Denies tinnitus, ear pain, hearing loss, epistaxis or discharge.  RESPIRATORY:  Complains of cough, productive sputum, green in color. Denies any wheezing, hemoptysis or COPD.  CARDIOVASCULAR:  Denies chest pain. No edema. No arrhythmia. No palpitations. No syncope.  GASTROINTESTINAL: Denies nausea, vomiting, diarrhea, abdominal pain, hematemesis, rectal bleed, hemorrhoids, change in bowel habits.  GENITOURINARY:  Denies dysuria, hematuria, renal colic.  ENDOCRINE:  Denies polyuria or polydipsia, heat or cold intolerance.  HEMATOLOGY:  Denies anemia, easy bruising, bleeding diathesis.  INTEGUMENTARY:  Denies acne, rash or skin lesions.  MUSCULOSKELETAL:  Complains of lower back pain, chronic. Denies any cramps or gout or swelling.  NEUROLOGIC:  Denies CVA, TIA, seizures, memory loss, headache, vertigo.  PSYCHIATRIC:  Denies anxiety, insomnia, substance or alcohol abuse or  schizophrenia.   PHYSICAL EXAMINATION: VITAL SIGNS: Temperature 98.9, T-max 100.5, pulse 86, respiratory rate 18, blood pressure 110/60, saturating 97% on 2 liters nasal cannula.  GENERAL:  Frail, elderly female, looks comfortable in bed, in no apparent distress.  HEENT: Head atraumatic, normocephalic. Pupils equal, reactive to light. Pink conjunctivae. Anicteric sclerae. Moist oral mucosa.  NECK:  Supple. No thyromegaly. No JVD.  CHEST:  Good air entry bilaterally with mild rales at the bases.  CARDIOVASCULAR:  S1, S2 heard. No rubs, murmur or gallops.  ABDOMEN:  Soft, nontender, nondistended. Bowel sounds present.  EXTREMITIES:  No edema, no clubbing, no cyanosis. PSYCHIATRIC:  Awake, alert, oriented x 2 to 3, mildly confused. Initially thought the president was Danae OrleansBush, but when asked again and given options, she chose Obama.  SKIN:  Normal skin turgor. Warm and dry. No rash.  NEUROLOGIC:  Cranial nerves grossly intact. Motor 5/5. No focal deficits.   PERTINENT LABORATORY DATA:  Glucose 111, BUN 53, creatinine 2.73, sodium 133, potassium 4.4, chloride 102, CO2 of 21. White blood cells 23.1, hemoglobin 9.6, hematocrit 29.9, platelets 287. Urinalysis showed negative nitrite, leukocyte esterase +1, and 1 white blood cell with trace bacteria. Chest x-ray does not show any significant opacity or infiltrate.     ASSESSMENT AND PLAN: 1.  Altered mental status. This is currently improved. The patient is back to baseline. This is most likely related to dehydration, renal failure and sepsis.  2.  Sepsis. The patient presents with fever of 100.5 with significant leukocytosis. This is most likely related to acute bronchitis. We will follow on the rest of her septic workup.  3.  Acute bronchitis. Continue with Levaquin.  4.  Diabetes mellitus. We will continue with insulin sliding scale.  5.  Renal failure, unclear if old or new.  We will check renal ultrasound.  6.  Anemia. We will check anemia panel.  Unclear what is her baseline.  7.  Hypertension, diabetes, gout, thyroid disease, chronic pain syndrome. We will have morning team check the patient's home medications, if they can get it from her PCP or from her pharmacy, and we will have them resume her back on her home medication. Given the fact that the patient is on methadone, unclear of the dose, we will have her on 5 mg 3 times a day until her dose is known to avoid any withdrawals. The  last dose was notated at 20 mg 3 times a day by pain management consult.  8.  CODE STATUS: FULL CODE.  9.  Deep vein thrombosis prophylaxis. Subcutaneous heparin.  10.  Gastrointestinal prophylaxis on Protonix.   TOTAL TIME SPENT ON ADMISSION AND PATIENT CARE: 60 minutes.    ____________________________ Starleen Armsawood S. Manny Vitolo, MD dse:dm D: 12/12/2012 03:06:00 ET T: 12/12/2012 10:43:27 ET JOB#: [id number]  cc: Starleen Armsawood S. Breyson Kelm, MD, <Dictator> Jamareon Shimel Teena IraniS Rishikesh Khachatryan MD ELECTRONICALLY SIGNED 12/15/2012 5:12

## 2015-02-28 NOTE — Discharge Summary (Signed)
PATIENT NAME:  Chloe Sanchez, Chloe Sanchez MR#:  [id number] DATE OF BIRTH:  05-Oct-1941  DATE OF ADMISSION:  12/12/2012 DATE OF DISCHARGE:  12/14/2012  For a detailed note, please take a look at the history and physical done on admission by Dr. Randol KernElgergawy.   DIAGNOSES AT DISCHARGE: 1.  Altered mental status, likely secondary to fever and bronchitis, much improved.  2.  Acute bronchitis, now much improved.  3.  Chronic pain syndrome. 4.  Acute on chronic renal failure. 5.  Hypertension. 6.  Restless leg syndrome.  7.  Hyperlipidemia.   DIET:  The patient was discharged on a low sodium, low fat diet.   ACTIVITY:  As tolerated.   FOLLOW-UP:  Is [facility]   DISCHARGE MEDICATIONS:  Colace 100 mg 3 caps daily, colchicine 0.6 mg daily, Tylenol Arthritis 2 tabs twice daily, Procrit every two weeks depending on the hemoglobin, iron sulfate 325 mg daily, methadone 30 mg in the morning, 20 mg at noon, 40 mg in the evening, Neurontin 600 mg twice daily, allopurinol 100 mg daily, Synthroid 88 mcg daily, Caduet 5/10 1 tab at bedtime, Requip 1 mg at bedtime, Nexium 40 mg daily, amitriptyline 25 mg at bedtime, Singulair 10 mg daily, Flonase one spray to each nostril twice daily, Allegra 60 mg at bedtime, aspirin 81 mg daily, fish oil 1000 mg daily, Drisdol 50,000 international units monthly, Benadryl 25 mg 2 caps q. 6 hours as needed, Vistaril 25 mg q. 8 hours, clobetasol 0.05% topically to be applied twice daily as needed, hydroxyzine 25 mg q. 6 hours as needed, Robitussin q. 4 hours as needed, Levaquin 250 mg x 5 days.   PERTINENT STUDIES DONE DURING THE HOSPITAL COURSE:  Are chest x-ray done on admission showing mild bibasilar reticular opacities secondary to atelectasis, left greater than the right.  An ultrasound of the kidneys showing no hydronephrosis.   Blood cultures and urine cultures essentially showing no growth.   BRIEF HOSPITAL COURSE:  This is a 74 year old female who  presented to the hospital with fever and altered mental status.   1.  Altered mental status/fever.  The source of the fever is unclear, but likely suspected to be secondary to acute bronchitis.  The patient's urinalysis was negative on admission.  Chest x-ray was negative on admission.  The patient had no further high fevers while in the hospital.  She was empirically treated with Levaquin for a possible bronchitis and her fever has since then resolved.  Her white cell count was 20,000 when she came in and it has come down to 13 at discharge and her mental status is now back down to baseline.  2.  Acute on chronic renal failure.  The patient's baseline creatinine is around 1.5 to 1.8.  After IV fluid hydration her creatinine has now come down to baseline.  This can further be followed as an outpatient.  3.  Chronic pain syndrome.  The patient is on methadone.  She will continue that.  4.  Diabetes.  The patient was maintained on a sliding scale insulin.  She will resume her home meds upon discharge.  5.  Hyperlipidemia.  The patient was maintained on atorvastatin.  She will resume that.  6.  Restless leg syndrome.  She will continue her Requip.  7.  Hypertension.  She remained hemodynamically stable.  She will continue her Norvasc.  8.  Hypothyroidism.  The patient was maintained on some Synthroid.  She will resume that.  9.  GERD.  The patient was maintained on her Nexium and she will also resume that upon discharge.   CODE STATUS:  PATIENT IS A FULL CODE.   TIME SPENT ON DISCHARGE:  Is 40 minutes.     ____________________________ Rolly Pancake. Cherlynn Kaiser, MD vjs:ea D: 12/14/2012 15:55:26 ET T: 12/14/2012 23:17:55 ET JOB#: [id number]  cc: Rolly Pancake. Cherlynn Kaiser, MD, <Dictator> Kristopher Oppenheim. Shawn Stall, MD Houston Siren MD ELECTRONICALLY SIGNED 12/20/2012 13:27

## 2015-06-09 ENCOUNTER — Other Ambulatory Visit: Payer: Self-pay | Admitting: Orthopedic Surgery

## 2015-06-09 DIAGNOSIS — M1711 Unilateral primary osteoarthritis, right knee: Secondary | ICD-10-CM

## 2015-06-12 ENCOUNTER — Ambulatory Visit
Admission: RE | Admit: 2015-06-12 | Discharge: 2015-06-12 | Disposition: A | Discharge status: Home / Self Care | Payer: Medicare (Managed Care) | Source: Ambulatory Visit | Attending: Orthopedic Surgery | Admitting: Orthopedic Surgery

## 2015-06-12 DIAGNOSIS — M1711 Unilateral primary osteoarthritis, right knee: Secondary | ICD-10-CM | POA: Diagnosis not present

## 2015-07-09 ENCOUNTER — Encounter
Admission: RE | Admit: 2015-07-09 | Discharge: 2015-07-09 | Disposition: A | Discharge status: Home / Self Care | Payer: Medicare (Managed Care) | Source: Ambulatory Visit | Attending: Orthopedic Surgery | Admitting: Orthopedic Surgery

## 2015-07-09 DIAGNOSIS — Z01812 Encounter for preprocedural laboratory examination: Secondary | ICD-10-CM | POA: Insufficient documentation

## 2015-07-09 HISTORY — DX: Depression, unspecified: F32.A

## 2015-07-09 HISTORY — DX: Dependence on supplemental oxygen: Z99.81

## 2015-07-09 HISTORY — DX: Sleep apnea, unspecified: G47.30

## 2015-07-09 HISTORY — DX: Chronic kidney disease, unspecified: N18.9

## 2015-07-09 HISTORY — DX: Major depressive disorder, single episode, unspecified: F32.9

## 2015-07-09 HISTORY — DX: Restless legs syndrome: G25.81

## 2015-07-09 HISTORY — DX: Unspecified osteoarthritis, unspecified site: M19.90

## 2015-07-09 HISTORY — DX: Hypothyroidism, unspecified: E03.9

## 2015-07-09 HISTORY — DX: Anemia, unspecified: D64.9

## 2015-07-09 HISTORY — DX: Polyneuropathy, unspecified: G62.9

## 2015-07-09 HISTORY — DX: Anxiety disorder, unspecified: F41.9

## 2015-07-09 HISTORY — DX: Type 2 diabetes mellitus without complications: E11.9

## 2015-07-09 HISTORY — DX: Essential (primary) hypertension: I10

## 2015-07-09 HISTORY — DX: Gout, unspecified: M10.9

## 2015-07-09 HISTORY — DX: Heart failure, unspecified: I50.9

## 2015-07-09 HISTORY — DX: Sjogren syndrome, unspecified: M35.00

## 2015-07-09 LAB — URINALYSIS COMPLETE WITH MICROSCOPIC (ARMC ONLY)
BILIRUBIN URINE: NEGATIVE
Glucose, UA: NEGATIVE mg/dL
HGB URINE DIPSTICK: NEGATIVE
Ketones, ur: NEGATIVE mg/dL
NITRITE: NEGATIVE
PH: 5 (ref 5.0–8.0)
PROTEIN: 100 mg/dL — AB
Specific Gravity, Urine: 1.016 (ref 1.005–1.030)

## 2015-07-09 LAB — BASIC METABOLIC PANEL
Anion gap: 6 (ref 5–15)
BUN: 30 mg/dL — AB (ref 6–20)
CALCIUM: 9.4 mg/dL (ref 8.9–10.3)
CHLORIDE: 108 mmol/L (ref 101–111)
CO2: 27 mmol/L (ref 22–32)
CREATININE: 1.69 mg/dL — AB (ref 0.44–1.00)
GFR, EST AFRICAN AMERICAN: 34 mL/min — AB (ref >60)
GFR, EST NON AFRICAN AMERICAN: 29 mL/min — AB (ref >60)
Glucose, Bld: 93 mg/dL (ref 65–99)
Potassium: 4.4 mmol/L (ref 3.5–5.1)
SODIUM: 141 mmol/L (ref 135–145)

## 2015-07-09 LAB — CBC
HCT: 30.6 % — ABNORMAL LOW (ref 35.0–47.0)
HEMOGLOBIN: 9.9 g/dL — AB (ref 12.0–16.0)
MCH: 29.4 pg (ref 26.0–34.0)
MCHC: 32.4 g/dL (ref 32.0–36.0)
MCV: 90.6 fL (ref 80.0–100.0)
PLATELETS: 292 10*3/uL (ref 150–440)
RBC: 3.38 MIL/uL — ABNORMAL LOW (ref 3.80–5.20)
RDW: 15.8 % — ABNORMAL HIGH (ref 11.5–14.5)
WBC: 6.5 10*3/uL (ref 3.6–11.0)

## 2015-07-09 LAB — PROTIME-INR
INR: 1.02
PROTHROMBIN TIME: 13.6 s (ref 11.4–15.0)

## 2015-07-09 LAB — SURGICAL PCR SCREEN
MRSA, PCR: POSITIVE — AB
Staphylococcus aureus: POSITIVE — AB

## 2015-07-09 LAB — APTT: aPTT: 31 seconds (ref 24–36)

## 2015-07-09 LAB — TYPE AND SCREEN
ABO/RH(D): A POS
ANTIBODY SCREEN: NEGATIVE

## 2015-07-09 LAB — SEDIMENTATION RATE: Sed Rate: 67 mm/hr — ABNORMAL HIGH (ref 0–30)

## 2015-07-09 NOTE — OR Nursing (Signed)
Notified Hope Foster at Dr. Rosita Kea office regarding pcr screen MRSA +, STAPH +

## 2015-07-09 NOTE — Patient Instructions (Signed)
  Your procedure is scheduled on: 07/18/15 Report to Day Surgery.  [facility] SECOND FLOOR To find out your arrival time please call [phone] between 1PM - 3PM on 07/17/15 Remember: Instructions that are not followed completely may result in serious medical risk, up to and including death, or upon the discretion of your surgeon and anesthesiologist your surgery may need to be rescheduled.    ___X_ 1. Do not eat food or drink liquids after midnight. No gum chewing or hard candies.     ___X_ 2. No Alcohol for 24 hours before or after surgery.   ____ 3. Bring all medications with you on the day of surgery if instructed.    X___ 4. Notify your doctor if there is any change in your medical condition     (cold, fever, infections).     Do not wear jewelry, make-up, hairpins, clips or nail polish.  Do not wear lotions, powders, or perfumes. You may wear deodorant.  Do not shave 48 hours prior to surgery. Men may shave face and neck.  Do not bring valuables to the hospital.    [facility] is not responsible for any belongings or valuables.               Contacts, dentures or bridgework may not be worn into surgery.  Leave your suitcase in the car. After surgery it may be brought to your room.  For patients admitted to the hospital, discharge time is determined by your                treatment team.   Patients discharged the day of surgery will not be allowed to drive home.   Please read over the following fact sheets that you were given:   Surgical Site Infection Prevention   _X___ Take these medicines the morning of surgery with A SIP OF WATER:    1. METHADONE  2. GABAPENTIN  3. LEVOTHYROXINE  4.AMLODIPINE  5.NEXIUM  6.LYRICA  ____ Fleet Enema (as directed)   _X___ Use CHG Soap as directed  _X___ Use inhalers on the day of surgery  ____ Stop metformin 2 days prior to surgery    ____ Take 1/2 of usual insulin dose the night before surgery and none on the morning of surgery.    _X___ Stop Coumadin/Plavix/aspirin on  STOP ASPIRIN 1 WEEK BEFORE SURGERY  _X__ Stop Anti-inflammatories on STOP MELOXICAM 1 WEEK BEFORE SURGERY   ____ Stop supplements until after surgery.    ____ Bring C-Pap to the hospital.

## 2015-07-10 ENCOUNTER — Encounter: Payer: Self-pay | Admitting: *Deleted

## 2015-07-10 LAB — ABO/RH: ABO/RH(D): A POS

## 2015-07-11 LAB — URINE CULTURE: Special Requests: NORMAL

## 2015-07-11 NOTE — OR Nursing (Signed)
Faxed results of urine culture to Dr Rosita Kea office

## 2015-07-17 ENCOUNTER — Inpatient Hospital Stay: Payer: Medicare (Managed Care) | Admitting: Anesthesiology

## 2015-07-17 ENCOUNTER — Encounter: Payer: Self-pay | Admitting: *Deleted

## 2015-07-17 ENCOUNTER — Inpatient Hospital Stay
Admission: AD | Admit: 2015-07-17 | Discharge: 2015-07-21 | DRG: 470 | Disposition: A | Discharge status: Skilled Nursing Facility | Payer: Medicare (Managed Care) | Source: Ambulatory Visit | Attending: Orthopedic Surgery | Admitting: Orthopedic Surgery

## 2015-07-17 ENCOUNTER — Encounter
Admission: AD | Disposition: A | Discharge status: Skilled Nursing Facility | Payer: Self-pay | Source: Ambulatory Visit | Attending: Orthopedic Surgery

## 2015-07-17 ENCOUNTER — Inpatient Hospital Stay: Payer: Medicare (Managed Care)

## 2015-07-17 DIAGNOSIS — I129 Hypertensive chronic kidney disease with stage 1 through stage 4 chronic kidney disease, or unspecified chronic kidney disease: Secondary | ICD-10-CM | POA: Diagnosis present

## 2015-07-17 DIAGNOSIS — E039 Hypothyroidism, unspecified: Secondary | ICD-10-CM | POA: Diagnosis present

## 2015-07-17 DIAGNOSIS — F329 Major depressive disorder, single episode, unspecified: Secondary | ICD-10-CM | POA: Diagnosis present

## 2015-07-17 DIAGNOSIS — E1122 Type 2 diabetes mellitus with diabetic chronic kidney disease: Secondary | ICD-10-CM | POA: Diagnosis present

## 2015-07-17 DIAGNOSIS — M1711 Unilateral primary osteoarthritis, right knee: Principal | ICD-10-CM | POA: Diagnosis present

## 2015-07-17 DIAGNOSIS — Z791 Long term (current) use of non-steroidal anti-inflammatories (NSAID): Secondary | ICD-10-CM

## 2015-07-17 DIAGNOSIS — G4730 Sleep apnea, unspecified: Secondary | ICD-10-CM | POA: Diagnosis present

## 2015-07-17 DIAGNOSIS — M109 Gout, unspecified: Secondary | ICD-10-CM | POA: Diagnosis present

## 2015-07-17 DIAGNOSIS — M3500 Sicca syndrome, unspecified: Secondary | ICD-10-CM | POA: Diagnosis present

## 2015-07-17 DIAGNOSIS — Z79899 Other long term (current) drug therapy: Secondary | ICD-10-CM | POA: Diagnosis not present

## 2015-07-17 DIAGNOSIS — I509 Heart failure, unspecified: Secondary | ICD-10-CM | POA: Diagnosis present

## 2015-07-17 DIAGNOSIS — Z7982 Long term (current) use of aspirin: Secondary | ICD-10-CM

## 2015-07-17 DIAGNOSIS — Z9981 Dependence on supplemental oxygen: Secondary | ICD-10-CM

## 2015-07-17 DIAGNOSIS — M1992 Post-traumatic osteoarthritis, unspecified site: Secondary | ICD-10-CM | POA: Diagnosis present

## 2015-07-17 DIAGNOSIS — Z79891 Long term (current) use of opiate analgesic: Secondary | ICD-10-CM | POA: Diagnosis not present

## 2015-07-17 DIAGNOSIS — E1142 Type 2 diabetes mellitus with diabetic polyneuropathy: Secondary | ICD-10-CM | POA: Diagnosis present

## 2015-07-17 DIAGNOSIS — N189 Chronic kidney disease, unspecified: Secondary | ICD-10-CM | POA: Diagnosis present

## 2015-07-17 DIAGNOSIS — G629 Polyneuropathy, unspecified: Secondary | ICD-10-CM | POA: Diagnosis present

## 2015-07-17 DIAGNOSIS — D649 Anemia, unspecified: Secondary | ICD-10-CM | POA: Diagnosis present

## 2015-07-17 DIAGNOSIS — M1710 Unilateral primary osteoarthritis, unspecified knee: Secondary | ICD-10-CM | POA: Diagnosis present

## 2015-07-17 DIAGNOSIS — G2581 Restless legs syndrome: Secondary | ICD-10-CM | POA: Diagnosis present

## 2015-07-17 DIAGNOSIS — G8918 Other acute postprocedural pain: Secondary | ICD-10-CM

## 2015-07-17 HISTORY — PX: TOTAL KNEE ARTHROPLASTY: SHX125

## 2015-07-17 LAB — CBC
HEMATOCRIT: 29.6 % — AB (ref 35.0–47.0)
HEMOGLOBIN: 9.7 g/dL — AB (ref 12.0–16.0)
MCH: 29.8 pg (ref 26.0–34.0)
MCHC: 32.9 g/dL (ref 32.0–36.0)
MCV: 90.6 fL (ref 80.0–100.0)
Platelets: 301 10*3/uL (ref 150–440)
RBC: 3.26 MIL/uL — ABNORMAL LOW (ref 3.80–5.20)
RDW: 15.8 % — ABNORMAL HIGH (ref 11.5–14.5)
WBC: 6.7 10*3/uL (ref 3.6–11.0)

## 2015-07-17 LAB — GLUCOSE, CAPILLARY
GLUCOSE-CAPILLARY: 96 mg/dL (ref 65–99)
GLUCOSE-CAPILLARY: 91 mg/dL (ref 65–99)

## 2015-07-17 LAB — CREATININE, SERUM
Creatinine, Ser: 1.19 mg/dL — ABNORMAL HIGH (ref 0.44–1.00)
GFR calc Af Amer: 51 mL/min — ABNORMAL LOW (ref >60)
GFR, EST NON AFRICAN AMERICAN: 44 mL/min — AB (ref >60)

## 2015-07-17 SURGERY — ARTHROPLASTY, KNEE, TOTAL
Anesthesia: Monitor Anesthesia Care | Site: Knee | Laterality: Right | Wound class: Clean

## 2015-07-17 MED ORDER — INFLUENZA VAC SPLIT QUAD 0.5 ML IM SUSY
0.5000 mL | PREFILLED_SYRINGE | INTRAMUSCULAR | Status: AC
  Administered 2015-07-18: 0.5 mL via INTRAMUSCULAR
  Filled 2015-07-17: qty 0.5

## 2015-07-17 MED ORDER — CEFAZOLIN SODIUM-DEXTROSE 2-3 GM-% IV SOLR
INTRAVENOUS | Status: AC
  Filled 2015-07-17: qty 50

## 2015-07-17 MED ORDER — SODIUM CHLORIDE 0.9 % IJ SOLN
INTRAMUSCULAR | Status: AC
  Filled 2015-07-17: qty 50

## 2015-07-17 MED ORDER — METHADONE HCL 10 MG PO TABS
40.0000 mg | ORAL_TABLET | Freq: Every day | ORAL | Status: DC
  Administered 2015-07-17: 40 mg via ORAL
  Filled 2015-07-17: qty 4

## 2015-07-17 MED ORDER — FLUTICASONE PROPIONATE 50 MCG/ACT NA SUSP
1.0000 | Freq: Every day | NASAL | Status: DC
  Administered 2015-07-17 – 2015-07-21 (×5): 1 via NASAL
  Filled 2015-07-17: qty 16

## 2015-07-17 MED ORDER — DOCUSATE SODIUM 100 MG PO CAPS
100.0000 mg | ORAL_CAPSULE | Freq: Two times a day (BID) | ORAL | Status: DC
  Administered 2015-07-17 – 2015-07-21 (×8): 100 mg via ORAL
  Filled 2015-07-17 (×8): qty 1

## 2015-07-17 MED ORDER — NEOMYCIN-POLYMYXIN B GU 40-200000 IR SOLN
Status: AC
  Filled 2015-07-17: qty 20

## 2015-07-17 MED ORDER — KETAMINE HCL 100 MG/ML IJ SOLN
250.0000 mg | INTRAMUSCULAR | Status: DC | PRN
  Administered 2015-07-17: 3.5 ug/kg/min via INTRAVENOUS

## 2015-07-17 MED ORDER — METHOCARBAMOL 500 MG PO TABS
500.0000 mg | ORAL_TABLET | Freq: Four times a day (QID) | ORAL | Status: DC | PRN
  Administered 2015-07-17: 500 mg via ORAL
  Filled 2015-07-17: qty 1

## 2015-07-17 MED ORDER — ALLOPURINOL 100 MG PO TABS
100.0000 mg | ORAL_TABLET | Freq: Every day | ORAL | Status: DC
  Administered 2015-07-17 – 2015-07-21 (×5): 100 mg via ORAL
  Filled 2015-07-17 (×5): qty 1

## 2015-07-17 MED ORDER — MONTELUKAST SODIUM 10 MG PO TABS
10.0000 mg | ORAL_TABLET | Freq: Every day | ORAL | Status: DC
  Administered 2015-07-17 – 2015-07-20 (×4): 10 mg via ORAL
  Filled 2015-07-17 (×4): qty 1

## 2015-07-17 MED ORDER — ACETAMINOPHEN 325 MG PO TABS: 650.0000 mg | ORAL_TABLET | Freq: Four times a day (QID) | ORAL | Status: DC | PRN

## 2015-07-17 MED ORDER — ENOXAPARIN SODIUM 30 MG/0.3ML ~~LOC~~ SOLN
30.0000 mg | Freq: Two times a day (BID) | SUBCUTANEOUS | Status: DC
  Administered 2015-07-18 – 2015-07-21 (×7): 30 mg via SUBCUTANEOUS
  Filled 2015-07-17 (×7): qty 0.3

## 2015-07-17 MED ORDER — CHLORHEXIDINE GLUCONATE 4 % EX LIQD: 60.0000 mL | Freq: Once | CUTANEOUS | Status: DC

## 2015-07-17 MED ORDER — CEFAZOLIN SODIUM-DEXTROSE 2-3 GM-% IV SOLR
2.0000 g | Freq: Four times a day (QID) | INTRAVENOUS | Status: AC
  Administered 2015-07-17 – 2015-07-18 (×3): 2 g via INTRAVENOUS
  Filled 2015-07-17 (×3): qty 50

## 2015-07-17 MED ORDER — METHADONE HCL 10 MG PO TABS: 30.0000 mg | ORAL_TABLET | Freq: Every day | ORAL | Status: DC

## 2015-07-17 MED ORDER — METOCLOPRAMIDE HCL 5 MG/ML IJ SOLN: 5.0000 mg | Freq: Three times a day (TID) | INTRAMUSCULAR | Status: DC | PRN

## 2015-07-17 MED ORDER — MORPHINE SULFATE (PF) 10 MG/ML IV SOLN
INTRAVENOUS | Status: AC
Start: 2015-07-17 — End: 2015-07-17
  Filled 2015-07-17: qty 1

## 2015-07-17 MED ORDER — CHLORHEXIDINE GLUCONATE CLOTH 2 % EX PADS
6.0000 | MEDICATED_PAD | Freq: Every day | CUTANEOUS | Status: DC
  Administered 2015-07-18 – 2015-07-20 (×3): 6 via TOPICAL

## 2015-07-17 MED ORDER — ALBUTEROL SULFATE (2.5 MG/3ML) 0.083% IN NEBU: 2.5000 mg | INHALATION_SOLUTION | RESPIRATORY_TRACT | Status: DC | PRN

## 2015-07-17 MED ORDER — CEFAZOLIN SODIUM-DEXTROSE 2-3 GM-% IV SOLR
2.0000 g | INTRAVENOUS | Status: AC
  Administered 2015-07-17: 2 g via INTRAVENOUS

## 2015-07-17 MED ORDER — TRANEXAMIC ACID 1000 MG/10ML IV SOLN
1000.0000 mg | INTRAVENOUS | Status: DC | PRN
  Administered 2015-07-17: 1000 mg via INTRAVENOUS

## 2015-07-17 MED ORDER — OXYCODONE HCL 5 MG PO TABS
5.0000 mg | ORAL_TABLET | Freq: Once | ORAL | Status: AC | PRN
  Administered 2015-07-17: 5 mg via ORAL

## 2015-07-17 MED ORDER — PREGABALIN 75 MG PO CAPS
150.0000 mg | ORAL_CAPSULE | Freq: Two times a day (BID) | ORAL | Status: DC
  Administered 2015-07-17 – 2015-07-19 (×5): 150 mg via ORAL
  Filled 2015-07-17 (×6): qty 2

## 2015-07-17 MED ORDER — METHOCARBAMOL 1000 MG/10ML IJ SOLN: 500.0000 mg | Freq: Four times a day (QID) | INTRAVENOUS | Status: DC | PRN

## 2015-07-17 MED ORDER — DOXYCYCLINE HYCLATE 50 MG PO CAPS
50.0000 mg | ORAL_CAPSULE | Freq: Every day | ORAL | Status: DC
  Filled 2015-07-17: qty 1

## 2015-07-17 MED ORDER — PNEUMOCOCCAL VAC POLYVALENT 25 MCG/0.5ML IJ INJ
0.5000 mL | INJECTION | INTRAMUSCULAR | Status: AC
  Administered 2015-07-20: 0.5 mL via INTRAMUSCULAR
  Filled 2015-07-17: qty 0.5

## 2015-07-17 MED ORDER — DIPHENHYDRAMINE HCL 12.5 MG/5ML PO ELIX: 12.5000 mg | ORAL_SOLUTION | ORAL | Status: DC | PRN

## 2015-07-17 MED ORDER — TIOTROPIUM BROMIDE MONOHYDRATE 18 MCG IN CAPS
18.0000 ug | ORAL_CAPSULE | Freq: Every day | RESPIRATORY_TRACT | Status: DC
  Administered 2015-07-18 – 2015-07-21 (×4): 18 ug via RESPIRATORY_TRACT
  Filled 2015-07-17 (×2): qty 5

## 2015-07-17 MED ORDER — PROPOFOL INFUSION 10 MG/ML OPTIME
INTRAVENOUS | Status: DC | PRN
  Administered 2015-07-17: 35 ug/kg/min via INTRAVENOUS

## 2015-07-17 MED ORDER — MAGNESIUM CITRATE PO SOLN: 1.0000 | Freq: Once | ORAL | Status: DC | PRN

## 2015-07-17 MED ORDER — FERROUS SULFATE 325 (65 FE) MG PO TABS
325.0000 mg | ORAL_TABLET | Freq: Every day | ORAL | Status: DC
  Administered 2015-07-18 – 2015-07-21 (×4): 325 mg via ORAL
  Filled 2015-07-17 (×4): qty 1

## 2015-07-17 MED ORDER — GABAPENTIN 600 MG PO TABS
600.0000 mg | ORAL_TABLET | Freq: Three times a day (TID) | ORAL | Status: DC
  Administered 2015-07-17 – 2015-07-21 (×10): 600 mg via ORAL
  Filled 2015-07-17 (×11): qty 1

## 2015-07-17 MED ORDER — MORPHINE (PF) INJECTION FOR INHALATION 10 MG/ML
RESPIRATORY_TRACT | Status: DC | PRN
  Administered 2015-07-17: 10 mg via RESPIRATORY_TRACT

## 2015-07-17 MED ORDER — FENTANYL CITRATE (PF) 100 MCG/2ML IJ SOLN
INTRAMUSCULAR | Status: DC | PRN
  Administered 2015-07-17: 50 ug via INTRAVENOUS

## 2015-07-17 MED ORDER — HYDROMORPHONE HCL 1 MG/ML IJ SOLN
INTRAMUSCULAR | Status: AC
  Administered 2015-07-17: 0.5 mg via INTRAVENOUS
  Filled 2015-07-17: qty 1

## 2015-07-17 MED ORDER — LEVOTHYROXINE SODIUM 88 MCG PO TABS
88.0000 ug | ORAL_TABLET | Freq: Every day | ORAL | Status: DC
  Administered 2015-07-18 – 2015-07-21 (×4): 88 ug via ORAL
  Filled 2015-07-17 (×4): qty 1

## 2015-07-17 MED ORDER — MAGNESIUM HYDROXIDE 400 MG/5ML PO SUSP
30.0000 mL | Freq: Every day | ORAL | Status: DC | PRN
  Administered 2015-07-18 – 2015-07-20 (×2): 30 mL via ORAL
  Filled 2015-07-17 (×2): qty 30

## 2015-07-17 MED ORDER — MUPIROCIN 2 % EX OINT
1.0000 "application " | TOPICAL_OINTMENT | Freq: Two times a day (BID) | CUTANEOUS | Status: DC
  Administered 2015-07-17 – 2015-07-21 (×9): 1 via NASAL
  Filled 2015-07-17: qty 22

## 2015-07-17 MED ORDER — DOXYCYCLINE HYCLATE 100 MG PO TABS
50.0000 mg | ORAL_TABLET | Freq: Every day | ORAL | Status: DC
  Administered 2015-07-17 – 2015-07-21 (×5): 50 mg via ORAL
  Filled 2015-07-17 (×2): qty 1
  Filled 2015-07-17: qty 2
  Filled 2015-07-17 (×4): qty 1

## 2015-07-17 MED ORDER — BUPIVACAINE-EPINEPHRINE (PF) 0.25% -1:200000 IJ SOLN
INTRAMUSCULAR | Status: AC
  Filled 2015-07-17: qty 30

## 2015-07-17 MED ORDER — OXYCODONE HCL 5 MG PO TABS
ORAL_TABLET | ORAL | Status: AC
  Filled 2015-07-17: qty 1

## 2015-07-17 MED ORDER — PANTOPRAZOLE SODIUM 40 MG PO TBEC
40.0000 mg | DELAYED_RELEASE_TABLET | Freq: Every day | ORAL | Status: DC
  Administered 2015-07-18 – 2015-07-21 (×4): 40 mg via ORAL
  Filled 2015-07-17 (×5): qty 1

## 2015-07-17 MED ORDER — SODIUM CHLORIDE 0.9 % IV SOLN
INTRAVENOUS | Status: DC
  Administered 2015-07-17 (×2): via INTRAVENOUS

## 2015-07-17 MED ORDER — MIDAZOLAM HCL 5 MG/5ML IJ SOLN
INTRAMUSCULAR | Status: DC | PRN
  Administered 2015-07-17: 1 mg via INTRAVENOUS

## 2015-07-17 MED ORDER — PHENOL 1.4 % MT LIQD: 1.0000 | OROMUCOSAL | Status: DC | PRN

## 2015-07-17 MED ORDER — HYDROMORPHONE HCL 1 MG/ML IJ SOLN
0.5000 mg | INTRAMUSCULAR | Status: AC | PRN
  Administered 2015-07-17 (×4): 0.5 mg via INTRAVENOUS

## 2015-07-17 MED ORDER — BISACODYL 10 MG RE SUPP
10.0000 mg | Freq: Every day | RECTAL | Status: DC | PRN
  Administered 2015-07-20: 10 mg via RECTAL
  Filled 2015-07-17: qty 1

## 2015-07-17 MED ORDER — ROPINIROLE HCL 1 MG PO TABS
1.0000 mg | ORAL_TABLET | Freq: Three times a day (TID) | ORAL | Status: DC
  Administered 2015-07-17 – 2015-07-21 (×12): 1 mg via ORAL
  Filled 2015-07-17 (×13): qty 1

## 2015-07-17 MED ORDER — ASPIRIN 81 MG PO CHEW
81.0000 mg | CHEWABLE_TABLET | Freq: Every day | ORAL | Status: DC
  Administered 2015-07-17 – 2015-07-21 (×5): 81 mg via ORAL
  Filled 2015-07-17 (×6): qty 1

## 2015-07-17 MED ORDER — OXYCODONE HCL 5 MG PO TABS
5.0000 mg | ORAL_TABLET | ORAL | Status: DC | PRN
  Administered 2015-07-17 (×2): 10 mg via ORAL
  Administered 2015-07-17 – 2015-07-18 (×2): 5 mg via ORAL
  Administered 2015-07-18 – 2015-07-19 (×4): 10 mg via ORAL
  Administered 2015-07-20: 5 mg via ORAL
  Filled 2015-07-17: qty 1
  Filled 2015-07-17 (×6): qty 2
  Filled 2015-07-17: qty 1
  Filled 2015-07-17 (×2): qty 2

## 2015-07-17 MED ORDER — ONDANSETRON HCL 4 MG/2ML IJ SOLN: 4.0000 mg | Freq: Four times a day (QID) | INTRAMUSCULAR | Status: DC | PRN

## 2015-07-17 MED ORDER — MENTHOL 3 MG MT LOZG: 1.0000 | LOZENGE | OROMUCOSAL | Status: DC | PRN

## 2015-07-17 MED ORDER — ACETAMINOPHEN 650 MG RE SUPP: 650.0000 mg | Freq: Four times a day (QID) | RECTAL | Status: DC | PRN

## 2015-07-17 MED ORDER — POLYVINYL ALCOHOL 1.4 % OP SOLN
1.0000 [drp] | Freq: Three times a day (TID) | OPHTHALMIC | Status: DC | PRN
  Administered 2015-07-18: 1 [drp] via OPHTHALMIC
  Filled 2015-07-17: qty 15

## 2015-07-17 MED ORDER — LORATADINE 10 MG PO TABS
10.0000 mg | ORAL_TABLET | Freq: Every day | ORAL | Status: DC
  Administered 2015-07-17 – 2015-07-21 (×5): 10 mg via ORAL
  Filled 2015-07-17 (×6): qty 1

## 2015-07-17 MED ORDER — METOCLOPRAMIDE HCL 5 MG PO TABS: 5.0000 mg | ORAL_TABLET | Freq: Three times a day (TID) | ORAL | Status: DC | PRN

## 2015-07-17 MED ORDER — GUAIFENESIN-DM 100-10 MG/5ML PO SYRP: 5.0000 mL | ORAL_SOLUTION | ORAL | Status: DC | PRN

## 2015-07-17 MED ORDER — ZOLPIDEM TARTRATE 5 MG PO TABS: 5.0000 mg | ORAL_TABLET | Freq: Every evening | ORAL | Status: DC | PRN

## 2015-07-17 MED ORDER — VITAMIN D (ERGOCALCIFEROL) 1.25 MG (50000 UNIT) PO CAPS: 50000.0000 [IU] | ORAL_CAPSULE | ORAL | Status: DC

## 2015-07-17 MED ORDER — PROPOFOL 10 MG/ML IV BOLUS
INTRAVENOUS | Status: DC | PRN
  Administered 2015-07-17: 10 mg via INTRAVENOUS

## 2015-07-17 MED ORDER — METHADONE HCL 10 MG PO TABS
10.0000 mg | ORAL_TABLET | Freq: Every day | ORAL | Status: DC
  Administered 2015-07-17 – 2015-07-18 (×2): 10 mg via ORAL
  Filled 2015-07-17 (×2): qty 1

## 2015-07-17 MED ORDER — SODIUM CHLORIDE 0.9 % IV SOLN
10000.0000 ug | INTRAVENOUS | Status: DC | PRN
Start: 2015-07-17 — End: 2015-07-17
  Administered 2015-07-17: 20 ug/min via INTRAVENOUS

## 2015-07-17 MED ORDER — POLYETHYLENE GLYCOL 3350 17 G PO PACK
17.0000 g | PACK | Freq: Every day | ORAL | Status: DC
  Administered 2015-07-18 – 2015-07-21 (×4): 17 g via ORAL
  Filled 2015-07-17 (×5): qty 1

## 2015-07-17 MED ORDER — AMLODIPINE-ATORVASTATIN 5-10 MG PO TABS: 1.0000 | ORAL_TABLET | Freq: Every day | ORAL | Status: DC

## 2015-07-17 MED ORDER — NEOMYCIN-POLYMYXIN B GU 40-200000 IR SOLN
Status: DC | PRN
  Administered 2015-07-17: 16 mL

## 2015-07-17 MED ORDER — FENTANYL CITRATE (PF) 100 MCG/2ML IJ SOLN
25.0000 ug | INTRAMUSCULAR | Status: DC | PRN
  Administered 2015-07-17: 50 ug via INTRAVENOUS
  Administered 2015-07-17 (×2): 25 ug via INTRAVENOUS

## 2015-07-17 MED ORDER — ALBUTEROL SULFATE HFA 108 (90 BASE) MCG/ACT IN AERS: 2.0000 | INHALATION_SPRAY | RESPIRATORY_TRACT | Status: DC | PRN

## 2015-07-17 MED ORDER — OXYCODONE HCL 5 MG/5ML PO SOLN: 5.0000 mg | Freq: Once | ORAL | Status: AC | PRN

## 2015-07-17 MED ORDER — TRANEXAMIC ACID 1000 MG/10ML IV SOLN
INTRAVENOUS | Status: AC
  Filled 2015-07-17: qty 10

## 2015-07-17 MED ORDER — LIDOCAINE HCL (PF) 1 % IJ SOLN
INTRAMUSCULAR | Status: DC | PRN
  Administered 2015-07-17: 1 mL via INTRADERMAL

## 2015-07-17 MED ORDER — BUPIVACAINE LIPOSOME 1.3 % IJ SUSP
INTRAMUSCULAR | Status: AC
  Filled 2015-07-17: qty 20

## 2015-07-17 MED ORDER — AMLODIPINE BESYLATE 5 MG PO TABS
5.0000 mg | ORAL_TABLET | Freq: Every day | ORAL | Status: DC
  Administered 2015-07-17 – 2015-07-21 (×5): 5 mg via ORAL
  Filled 2015-07-17 (×5): qty 1

## 2015-07-17 MED ORDER — BUPIVACAINE HCL (PF) 0.5 % IJ SOLN
INTRAMUSCULAR | Status: DC | PRN
  Administered 2015-07-17: 2 mL via INTRATHECAL

## 2015-07-17 MED ORDER — ATORVASTATIN CALCIUM 10 MG PO TABS
10.0000 mg | ORAL_TABLET | Freq: Every day | ORAL | Status: DC
  Administered 2015-07-17 – 2015-07-20 (×4): 10 mg via ORAL
  Filled 2015-07-17 (×4): qty 1

## 2015-07-17 MED ORDER — ONDANSETRON HCL 4 MG PO TABS: 4.0000 mg | ORAL_TABLET | Freq: Four times a day (QID) | ORAL | Status: DC | PRN

## 2015-07-17 MED ORDER — SODIUM CHLORIDE 0.9 % IV SOLN
INTRAVENOUS | Status: DC
  Administered 2015-07-17 – 2015-07-19 (×2): via INTRAVENOUS

## 2015-07-17 MED ORDER — BUPIVACAINE-EPINEPHRINE (PF) 0.25% -1:200000 IJ SOLN
INTRAMUSCULAR | Status: DC | PRN
  Administered 2015-07-17: 30 mL via PERINEURAL

## 2015-07-17 MED ORDER — MORPHINE SULFATE (PF) 2 MG/ML IV SOLN
2.0000 mg | INTRAVENOUS | Status: DC | PRN
  Administered 2015-07-17 (×3): 2 mg via INTRAVENOUS
  Filled 2015-07-17 (×3): qty 1

## 2015-07-17 MED ORDER — BUPIVACAINE LIPOSOME 1.3 % IJ SUSP
INTRAMUSCULAR | Status: DC | PRN
  Administered 2015-07-17: 60 mL

## 2015-07-17 MED ORDER — FENTANYL CITRATE (PF) 100 MCG/2ML IJ SOLN
INTRAMUSCULAR | Status: AC
  Administered 2015-07-17: 25 ug via INTRAVENOUS
  Filled 2015-07-17: qty 2

## 2015-07-17 MED ORDER — KETAMINE HCL 50 MG/ML IJ SOLN
INTRAMUSCULAR | Status: DC | PRN
  Administered 2015-07-17: 1 mg via INTRAMUSCULAR

## 2015-07-17 MED ORDER — AMITRIPTYLINE HCL 25 MG PO TABS
50.0000 mg | ORAL_TABLET | Freq: Every day | ORAL | Status: DC
  Administered 2015-07-17 – 2015-07-20 (×4): 50 mg via ORAL
  Filled 2015-07-17 (×4): qty 2

## 2015-07-17 SURGICAL SUPPLY — 51 items
BANDAGE ELASTIC 6 CLIP ST LF (GAUZE/BANDAGES/DRESSINGS) ×3 IMPLANT
BLADE SAW 1 (BLADE) ×3 IMPLANT
BLOCK CUTTING FEMUR 3 RT MED (MISCELLANEOUS) IMPLANT
BLOCK CUTTING TIBIAL 3 RT (MISCELLANEOUS) IMPLANT
BONE CEMENT GENTAMICIN (Cement) ×6 IMPLANT
CANISTER SUCT 1200ML W/VALVE (MISCELLANEOUS) ×3 IMPLANT
CANISTER SUCT 3000ML (MISCELLANEOUS) ×6 IMPLANT
CAPT KNEE TOTAL 3 ×3 IMPLANT
CATH FOL LEG HOLDER (MISCELLANEOUS) ×3 IMPLANT
CATH TRAY METER 16FR LF (MISCELLANEOUS) ×3 IMPLANT
CEMENT BONE GENTAMICIN 40 (Cement) ×2 IMPLANT
CHLORAPREP W/TINT 26ML (MISCELLANEOUS) ×6 IMPLANT
COOLER POLAR GLACIER W/PUMP (MISCELLANEOUS) ×3 IMPLANT
DRAPE INCISE IOBAN 66X45 STRL (DRAPES) ×6 IMPLANT
DRAPE SHEET LG 3/4 BI-LAMINATE (DRAPES) ×6 IMPLANT
ELECT CAUTERY BLADE 6.4 (BLADE) ×3 IMPLANT
GAUZE PETRO XEROFOAM 1X8 (MISCELLANEOUS) ×3 IMPLANT
GAUZE SPONGE 4X4 12PLY STRL (GAUZE/BANDAGES/DRESSINGS) ×3 IMPLANT
GLOVE BIOGEL PI IND STRL 9 (GLOVE) ×1 IMPLANT
GLOVE BIOGEL PI INDICATOR 9 (GLOVE) ×2
GLOVE SURG ORTHO 9.0 STRL STRW (GLOVE) ×3 IMPLANT
GOWN SPECIALTY ULTRA XL (MISCELLANEOUS) ×3 IMPLANT
GOWN STRL REUS W/ TWL LRG LVL3 (GOWN DISPOSABLE) ×2 IMPLANT
GOWN STRL REUS W/TWL LRG LVL3 (GOWN DISPOSABLE) ×4
HANDPIECE SUCTION TUBG SURGILV (MISCELLANEOUS) ×6 IMPLANT
HOOD PEEL AWAY FACE SHEILD DIS (HOOD) ×6 IMPLANT
IMMBOLIZER KNEE 19 BLUE UNIV (SOFTGOODS) ×3 IMPLANT
IV SET EXTENSION 6 LL TADAPT (SET/KITS/TRAYS/PACK) ×3 IMPLANT
KNEE MEDACTA TIBIAL/FEMORAL BL (Knees) ×3 IMPLANT
KNIFE SCULPS 14X20 (INSTRUMENTS) ×3 IMPLANT
NDL SAFETY 18GX1.5 (NEEDLE) ×3 IMPLANT
NEEDLE SPNL 18GX3.5 QUINCKE PK (NEEDLE) ×3 IMPLANT
NEEDLE SPNL 20GX3.5 QUINCKE YW (NEEDLE) ×3 IMPLANT
NS IRRIG 1000ML POUR BTL (IV SOLUTION) ×3 IMPLANT
PACK TOTAL KNEE (MISCELLANEOUS) ×3 IMPLANT
PAD GROUND ADULT SPLIT (MISCELLANEOUS) ×3 IMPLANT
PAD WRAPON POLAR KNEE (MISCELLANEOUS) ×1 IMPLANT
SOL .9 NS 3000ML IRR  AL (IV SOLUTION) ×2
SOL .9 NS 3000ML IRR UROMATIC (IV SOLUTION) ×1 IMPLANT
STAPLER SKIN PROX 35W (STAPLE) ×3 IMPLANT
STRAP SAFETY BODY (MISCELLANEOUS) ×3 IMPLANT
SUCTION FRAZIER TIP 10 FR DISP (SUCTIONS) ×3 IMPLANT
SUT DVC 2 QUILL PDO  T11 36X36 (SUTURE) ×2
SUT DVC 2 QUILL PDO T11 36X36 (SUTURE) ×1 IMPLANT
SUT DVC QUILL MONODERM 30X30 (SUTURE) ×3 IMPLANT
SUT ETHIBOND NAB CT1 #1 30IN (SUTURE) ×3 IMPLANT
SYR 20CC LL (SYRINGE) ×3 IMPLANT
SYR 50ML LL SCALE MARK (SYRINGE) ×3 IMPLANT
TOWER CARTRIDGE SMART MIX (DISPOSABLE) ×3 IMPLANT
WATER STERILE IRR 1000ML POUR (IV SOLUTION) IMPLANT
WRAPON POLAR PAD KNEE (MISCELLANEOUS) ×3

## 2015-07-17 NOTE — H&P (Signed)
Reviewed paper H+P, will be scanned into chart. No changes noted.  

## 2015-07-17 NOTE — Anesthesia Procedure Notes (Signed)
Spinal Patient location during procedure: OR Start time: 07/17/2015 8:38 AM End time: 07/17/2015 8:42 AM Staffing Anesthesiologist: Katy Fitch K Performed by: anesthesiologist  Preanesthetic Checklist Completed: patient identified, site marked, surgical consent, pre-op evaluation, timeout performed, IV checked, risks and benefits discussed and monitors and equipment checked Spinal Block Patient position: sitting Prep: Betadine Patient monitoring: heart rate, continuous pulse ox, blood pressure and cardiac monitor Approach: midline Location: L4-5 Injection technique: single-shot Needle Needle type: Whitacre and Introducer  Needle gauge: 24 G Needle length: 9 cm Assessment Sensory level: L1 Additional Notes Negative paresthesia. Negative blood return. Positive free-flowing CSF. Expiration date of kit checked and confirmed. Patient tolerated procedure well, without complications.

## 2015-07-17 NOTE — Anesthesia Postprocedure Evaluation (Signed)
  Anesthesia Post-op Note  Patient: Chloe Sanchez  Procedure(s) Performed: Procedure(s): TOTAL KNEE ARTHROPLASTY (Right)  Anesthesia type:General, MAC, Spinal  Patient location: PACU  Post pain: Pain level controlled  Post assessment: Post-op Vital signs reviewed, [name] Cardiovascular Status Stable, Respiratory Function Stable, Patent Airway and No signs of Nausea or vomiting  Post vital signs: Reviewed and stable  Last Vitals:  Filed Vitals:   07/17/15 1345  BP: 145/65  Pulse: 89  Temp: 35.7 C  Resp: 16    Level of consciousness: awake, alert  and patient cooperative  Complications: No apparent anesthesia complications

## 2015-07-17 NOTE — Op Note (Signed)
07/17/2015  10:52 AM  PATIENT:  Chloe Sanchez  74 y.o. female  PRE-OPERATIVE DIAGNOSIS:  OSTEOARTHRITIS  POST-OPERATIVE DIAGNOSIS:  OSTEOARTHRITIS  PROCEDURE:  Procedure(s): TOTAL KNEE ARTHROPLASTY (Right)  SURGEON: Leitha Schuller, MD  ASSISTANTS: None  ANESTHESIA:   spinal  EBL:  Total I/O In: 1500 [I.V.:1500] Out: 550 [Urine:100; Blood:450]  BLOOD ADMINISTERED:none  DRAINS: none   LOCAL MEDICATIONS USED:  MARCAINE    Exparel and morphine  SPECIMEN:  Source of Specimen:  Cut ends of bone  DISPOSITION OF SPECIMEN:  PATHOLOGY  COUNTS:  YES  TOURNIQUET:   43 minutes at 300 mmHg  IMPLANTS: Medacta 3+ GMK sphere, 3 tibia with 17 mm insert and 1 patella all components cemented  DICTATION: .Dragon Dictation patient brought the operating room and after adequate anesthesia was obtained the right leg was prepped and draped in sterile fashion. After patient identification and timeout procedures were completed a midline skin incision was made in flexion followed by medial parapatellar arthrotomy action revealed eburnated bone in the lateral compartment and patellofemoral joint, medial compartment relatively spared. Anterior cruciate ligament PCL and fat pad were excised. The proximal tibia was exposed medially for application of the Medacta cutting block this is applied and proximal tibia cut carried out at the appropriate level. This bone was removed and the femur was approached a similar fashion with a distal femoral cut carried out followed by the 4-in-1 cut of the distal femur with no notching. The trials were placed after excision of the menisci with drilling of the proximal tibia and keel punch. A 14 mm insert gave good stability. The distal femoral drill holes were made followed by the trochlear groove cut THESE trials were all removed and the patella cut using the patellar cutting guide 3 drill holes made the sized to a size 1 this point the tourniquet was raised and the knee  infiltrated with accommodation of Exparel diluted with saline morphine and Sensorcaine with epinephrine the bony surfaces were then thoroughly irrigated and dried and the components were cemented into place with a 14 mm trial. The lateral side was still tight and after loosening this by elevating the deep lateral collateral and high crusting the IT band the knee was somewhat looser and a 17 mm insert was required. This was placed and screwed into position with the set screw with a torque limiting screwdriver. Patellar button was clamped into place prior to placing the polyethylene all components having been cemented excess cement was removed after cement was set the patella tracked well with no touch technique. The knee was irrigated with a dilute Betadine solution followed by pulsatile lavage arthrotomy was repaired using a heavy Quill and some Ethibond along the medial patellar retinaculum. 2-0 Quill subcutaneously followed by skin staples. Xeroform 4 x 4's ABDs and web roll Ace wrap applied with normal mobilizer secondary to her valgus deformity.   PLAN OF CARE: Admit to inpatient   PATIENT DISPOSITION:  PACU - hemodynamically stable.

## 2015-07-17 NOTE — Transfer of Care (Signed)
Immediate Anesthesia Transfer of Care Note  Patient: Chloe Sanchez  Procedure(s) Performed: Procedure(s): TOTAL KNEE ARTHROPLASTY (Right)  Patient Location: PACU  Anesthesia Type:Spinal  Level of Consciousness: sedated  Airway & Oxygen Therapy: Patient Spontanous Breathing and Patient connected to nasal cannula oxygen  Post-op Assessment: Report given to RN and Post -op Vital signs reviewed and stable  Post vital signs: Reviewed and stable  Last Vitals:  Filed Vitals:   07/17/15 1053  BP: 115/66  Pulse:   Temp: 37 C  Resp: 10    Complications: No apparent anesthesia complications

## 2015-07-17 NOTE — Progress Notes (Signed)
MODERATE TO SEVERE PAIN WITH RELIEF WITH MORPHINE,ROBAXIN AND OXYCODONE. NEURO CHECKS WNL. OOB TO CHAIR FOR 2 HOURS. TOLERATED WELL. BONE FOAM PILLOW IN PLACE

## 2015-07-17 NOTE — Anesthesia Preprocedure Evaluation (Addendum)
Anesthesia Evaluation  Patient identified by MRN, date of birth, ID band Patient awake    Reviewed: Allergy & Precautions, H&P , NPO status , [name] Chart, lab work & pertinent test results  History of Anesthesia Complications Negative for: history of anesthetic complications  Airway Mallampati: III  TM Distance: >3 FB Neck ROM: limited    Dental no notable dental hx. (+) Upper Dentures, Lower Dentures, Poor Dentition, Missing   Pulmonary sleep apnea ,    Pulmonary exam normal breath sounds clear to auscultation       Cardiovascular Exercise Tolerance: Good hypertension, +CHF  Normal cardiovascular exam Rhythm:regular Rate:Normal     Neuro/Psych PSYCHIATRIC DISORDERS Anxiety Depression negative neurological ROS  negative psych ROS   GI/Hepatic negative GI ROS, Neg liver ROS,   Endo/Other  diabetes, Type 2Hypothyroidism   Renal/GU Renal disease  negative genitourinary   Musculoskeletal  (+) Arthritis ,   Abdominal   Peds  Hematology negative hematology ROS (+)   Anesthesia Other Findings Past Medical History:   RLS (restless legs syndrome)                                 Anxiety                                                      Arthritis                                                    Diabetes mellitus without complication                       Anemia                                                       Sleep apnea                                                  CHF (congestive heart failure)                               Hypothyroidism                                               Depression                                                   Gout  Sjogren's syndrome                                           Polyneuropathy                                               Hypertension                                                 On supplemental  oxygen therapy                                 Comment:AT NIGHT   Chronic kidney disease                                         Comment:CKD   Reproductive/Obstetrics negative OB ROS                            Anesthesia Physical Anesthesia Plan  ASA: III  Anesthesia Plan: General, MAC and Spinal   Post-op Pain Management:    Induction:   Airway Management Planned:   Additional Equipment:   Intra-op Plan:   Post-operative Plan:   Informed Consent: I have reviewed the patients History and Physical, chart, labs and discussed the procedure including the risks, benefits and alternatives for the proposed anesthesia with the patient or authorized representative who has indicated his/her understanding and acceptance.   Dental Advisory Given  Plan Discussed with: Anesthesiologist, CRNA and Surgeon  Anesthesia Plan Comments: (History of lumbar back surgery.  Plan to attempt spinal.  GA if unsuccessful )       Anesthesia Quick Evaluation

## 2015-07-17 NOTE — Progress Notes (Signed)
Xray of left knee complete, polar care applied to left knee.

## 2015-07-17 NOTE — Evaluation (Signed)
Physical Therapy Evaluation Patient Details Name: Chloe Sanchez MRN: [id number] DOB: February 13, 1941 Today's Date: 07/17/2015   History of Present Illness  Pt is a 74 yo female who was admitted to the hospital s/p R TKA on 07/17/15  Clinical Impression  Pt presents with hx of DM, CHF, depression, HTN, and CKD. Examination reveals that pt performs bed mobility at min assist, transfers at mod assist, and ambulation at min assist. Pt is very pleasant to work with and motivated to participate in therapy. She has acute pain, ROM, strength, and gait deficits that will benefit from skilled PT in order for her to eventually return home safely. Pt able to perform 10 AROM SLRs successfully, therefore no KI was donned with mobility.    Follow Up Recommendations SNF    Equipment Recommendations  Rolling walker with 5" wheels    Recommendations for Other Services       Precautions / Restrictions Precautions Precautions: Fall Restrictions Weight Bearing Restrictions: Yes Other Position/Activity Restrictions: WBAT      Mobility  Bed Mobility Overal bed mobility: Needs Assistance Bed Mobility: Supine to Sit     Supine to sit: Min assist     General bed mobility comments: Pt requires assist for LE management but shows good UE strength in managing her trunk through use of the side rails   Transfers Overall transfer level: Needs assistance Equipment used: Rolling walker (2 wheeled) Transfers: Sit to/from Stand Sit to Stand: Mod assist         General transfer comment: Pt needs cues on hand and foot placement prior to transfer. She also needs assist to get fully into standing and a cue to stand up tall in her walker  Ambulation/Gait Ambulation/Gait assistance: Min assist Ambulation Distance (Feet): 2 Feet Assistive device: Rolling walker (2 wheeled) Gait Pattern/deviations: Step-to pattern;Decreased step length - right;Decreased step length - left;Decreased stride length;Shuffle Gait  velocity: decreased  Gait velocity interpretation: <1.8 ft/sec, indicative of risk for recurrent falls General Gait Details: Pt requires assist for advancement/turning of RW as well as cues for sequencing of walker with gait. No buckling or LOB noted. Due to pt being able to perform 10 AROM SLRs succesfully, no KI was donned during Arts administrator Rankin (Stroke Patients Only)       Balance Overall balance assessment: No apparent balance deficits (not formally assessed)                                           Pertinent Vitals/Pain Pain Assessment: 0-10 Pain Score: 4  Pain Location: R knee Pain Intervention(s): Limited activity within patient's tolerance;Monitored during session;Ice applied    Home Living Family/patient expects to be discharged to:: Skilled nursing facility Living Arrangements: Children               Additional Comments: Pt lives with child, but child cannot provide 24 hr assistance, and pt knows nobody who can    Prior Function Level of Independence: Independent with assistive device(s)         Comments: Pt was driving, ambulating community distances, and performing ADLs independently (with a cane for ambulation)     Hand Dominance        Extremity/Trunk Assessment   Upper Extremity Assessment: Overall WFL for tasks assessed  Lower Extremity Assessment: RLE deficits/detail RLE Deficits / Details: Gross MMT RLE at least 3/5       Communication   Communication: No difficulties  Cognition Arousal/Alertness: Awake/alert Behavior During Therapy: WFL for tasks assessed/performed Overall Cognitive Status: Within Functional Limits for tasks assessed                      General Comments      Exercises Total Joint Exercises Goniometric ROM: R knee AAROM: 12 - 81 degrees  Other Exercises Other Exercises: Pt performed R LE therex x 10 reps at  supervision for technique. Exercises included: ankle pumps, quad sets, glute sets, SLR, and hip abd      Assessment/Plan    PT Assessment Patient needs continued PT services  PT Diagnosis Difficulty walking;Abnormality of gait;Acute pain   PT Problem List Decreased strength;Decreased range of motion;Decreased activity tolerance;Decreased balance;Decreased coordination;Decreased knowledge of use of DME;Pain  PT Treatment Interventions DME instruction;Gait training;Stair training;Functional mobility training;Therapeutic activities;Therapeutic exercise;Balance training;Neuromuscular re-education;Patient/family education;Wheelchair mobility training;Manual techniques;Modalities   PT Goals (Current goals can be found in the Care Plan section) Acute Rehab PT Goals Patient Stated Goal: to get back to PLOF PT Goal Formulation: With patient Time For Goal Achievement: 07/31/15 Potential to Achieve Goals: Good    Frequency BID   Barriers to discharge Decreased caregiver support Only partial assistance available     Co-evaluation               End of Session Equipment Utilized During Treatment: Gait belt Activity Tolerance: Patient tolerated treatment well Patient left: in chair;with call bell/phone within reach;with chair alarm set;with SCD's reapplied Nurse Communication: Mobility status         Time: 1540-1605 PT Time Calculation (min) (ACUTE ONLY): 25 min   Charges:         PT G CodesBenna Dunks 08-15-2015, 5:03 PM Benna Dunks, SPT. [phone]

## 2015-07-18 ENCOUNTER — Encounter: Payer: Self-pay | Admitting: Orthopedic Surgery

## 2015-07-18 DIAGNOSIS — M1711 Unilateral primary osteoarthritis, right knee: Secondary | ICD-10-CM | POA: Diagnosis not present

## 2015-07-18 LAB — BASIC METABOLIC PANEL
ANION GAP: 8 (ref 5–15)
BUN: 15 mg/dL (ref 6–20)
CALCIUM: 8.7 mg/dL — AB (ref 8.9–10.3)
CO2: 28 mmol/L (ref 22–32)
CREATININE: 1.13 mg/dL — AB (ref 0.44–1.00)
Chloride: 101 mmol/L (ref 101–111)
GFR calc Af Amer: 54 mL/min — ABNORMAL LOW (ref >60)
GFR, EST NON AFRICAN AMERICAN: 47 mL/min — AB (ref >60)
GLUCOSE: 161 mg/dL — AB (ref 65–99)
Potassium: 3.5 mmol/L (ref 3.5–5.1)
Sodium: 137 mmol/L (ref 135–145)

## 2015-07-18 LAB — CBC
HCT: 30.9 % — ABNORMAL LOW (ref 35.0–47.0)
Hemoglobin: 10.1 g/dL — ABNORMAL LOW (ref 12.0–16.0)
MCH: 29.2 pg (ref 26.0–34.0)
MCHC: 32.5 g/dL (ref 32.0–36.0)
MCV: 89.6 fL (ref 80.0–100.0)
PLATELETS: 313 10*3/uL (ref 150–440)
RBC: 3.45 MIL/uL — ABNORMAL LOW (ref 3.80–5.20)
RDW: 16 % — AB (ref 11.5–14.5)
WBC: 10.9 10*3/uL (ref 3.6–11.0)

## 2015-07-18 MED ORDER — METHADONE HCL 10 MG PO TABS
30.0000 mg | ORAL_TABLET | Freq: Every day | ORAL | Status: DC
  Administered 2015-07-19: 30 mg via ORAL
  Filled 2015-07-18 (×2): qty 3

## 2015-07-18 MED ORDER — METHADONE HCL 10 MG PO TABS
10.0000 mg | ORAL_TABLET | Freq: Every day | ORAL | Status: DC
  Administered 2015-07-19 – 2015-07-21 (×2): 10 mg via ORAL
  Filled 2015-07-18 (×2): qty 1

## 2015-07-18 MED ORDER — METHADONE HCL 10 MG PO TABS
40.0000 mg | ORAL_TABLET | Freq: Every day | ORAL | Status: DC
  Administered 2015-07-19: 40 mg via ORAL
  Filled 2015-07-18: qty 4

## 2015-07-18 NOTE — Clinical Social Work Note (Signed)
Clinical Social Work Assessment  Patient Details  Name: Chloe Sanchez MRN: [id number] Date of Birth: Oct 07, 1941  Date of referral:  07/18/15               Reason for consult:  Facility Placement                Permission sought to share information with:  Family Supports Permission granted to share information::  Yes, Verbal Permission Granted  Name::      ([name]/ [phone])  Agency::   ([facility])  Relationship::   ([name])  Solicitor Information:     Housing/Transportation Living arrangements for the past 2 months:  Single Family Home Source of Information:  Adult Children Patient Interpreter Needed:  None Criminal Activity/Legal Involvement Pertinent to Current Situation/Hospitalization:  No - Comment as needed Significant Relationships:  Adult Children, Other(Comment) ([facility] team) Lives with:  Adult Children Do you feel safe going back to the place where you live?  No Need for family participation in patient care:  Yes (Comment)  Care giving concerns:  Need for SNF   Social Worker assessment / plan:  CSW spoke with [facility] care team rep, Apolonio Schneiders, who reports patient is a [facility] patient and has been referred to [facility] for rehab. [facility]ACE team reports they have completed the FL2 and PASARR and secured the bed at SNF. CSW spoke with son by phone who agrees to plans for SNF at dc.   Employment status:  Retired Health and safety inspector:  Medicaid In [place] PT Recommendations:  Skilled Nursing Facility Information / Referral to community resources:  Skilled Nursing Facility  Patient/Family's Response to care:  Son reports he will be here (driving truck from [place]) but will be here tomorrow to visit patient.   Patient/Family's Understanding of and Emotional Response to Diagnosis, Current Treatment, and Prognosis:  Son understands the plans and agrees with treatment.   Emotional Assessment Appearance:  Appears older than stated age, Other (Comment  Required Attitude/Demeanor/Rapport:  Unable to Assess Affect (typically observed):  Unable to Assess Orientation:  Fluctuating Orientation (Suspected and/or reported Sundowners) Alcohol / Substance use:  Not Applicable Psych involvement (Current and /or in the community):  No (Comment)  Discharge Needs  Concerns to be addressed:  Discharge Planning Concerns Readmission within the last 30 days:  No Current discharge risk:  Physical Impairment Barriers to Discharge:  No Barriers Identified   [name], LCSW 07/18/2015, 2:54 PM

## 2015-07-18 NOTE — Progress Notes (Signed)
Physical Therapy Treatment Patient Details Name: Chloe Sanchez MRN: [id number] DOB: 1941-01-05 Today's Date: 07/18/2015    History of Present Illness Pt is a 74 yo female who was admitted to the hospital s/p R TKA on 07/17/15    PT Comments    Pt limited by pain this afternoon, with decrease in the amount of mobility she can tolerate. Nurse notified. PT made it clear to her that tomorrow she would need to ambulate a considerable distance and she agreed. Pt is, however, progressing in her therex. Due to her pain, ROM, and mobility deficits, she would benefit from skilled PT in order for her to eventually return home safely.   Follow Up Recommendations  SNF     Equipment Recommendations  Rolling walker with 5" wheels    Recommendations for Other Services       Precautions / Restrictions Precautions Precautions: Fall Restrictions Weight Bearing Restrictions: Yes Other Position/Activity Restrictions: WBAT    Mobility  Bed Mobility Overal bed mobility: Needs Assistance Bed Mobility: Supine to Sit     Supine to sit: Mod assist     General bed mobility comments: Pt requiring more assist for trunk and LE management today. Greater c/o pain  Transfers Overall transfer level: Needs assistance Equipment used: Rolling walker (2 wheeled) Transfers: Sit to/from Stand Sit to Stand: Max assist         General transfer comment: Pt in great deal of pain with transfer. Needs heavy assist and cannot get her hips underneath her to come fully into standing. She states this is because of the pain. Due to inabliity to fully stand, ambulation not appropriate  Ambulation/Gait Ambulation/Gait assistance:  (Unsafe/not appropriate)               Stairs            Wheelchair Mobility    Modified Rankin (Stroke Patients Only)       Balance Overall balance assessment: No apparent balance deficits (not formally assessed)                                   Cognition Arousal/Alertness: Lethargic;Suspect due to medications Behavior During Therapy: WFL for tasks assessed/performed Overall Cognitive Status: Within Functional Limits for tasks assessed                      Exercises Total Joint Exercises Goniometric ROM: R knee AAROM: 10 - 85 degrees  Other Exercises Other Exercises: Pt performed bilateral therex x 12 reps at supervision for technique. Exercises included: ankle pumps, quad sets, glute sets, SLR, and hip abd    General Comments        Pertinent Vitals/Pain Pain Assessment: 0-10 Pain Score: 8  Pain Location: R knee Pain Intervention(s): Limited activity within patient's tolerance;Monitored during session;Premedicated before session;Utilized relaxation techniques;Ice applied    Home Living                      Prior Function            PT Goals (current goals can now be found in the care plan section) Acute Rehab PT Goals Patient Stated Goal: to do therapy PT Goal Formulation: With patient Time For Goal Achievement: 07/31/15 Potential to Achieve Goals: Good Progress towards PT goals: PT to reassess next treatment    Frequency  BID    PT Plan Current plan remains appropriate  Co-evaluation             End of Session Equipment Utilized During Treatment: Gait belt Activity Tolerance: Patient tolerated treatment well Patient left: in bed;with call bell/phone within reach;with bed alarm set;with SCD's reapplied     Time: 1610-9604 PT Time Calculation (min) (ACUTE ONLY): 24 min  Charges:                       G CodesBenna Dunks 07-21-2015, 3:40 PM  Benna Dunks, SPT. [phone]

## 2015-07-18 NOTE — Progress Notes (Signed)
PT Cancellation Note  Patient Details Name: Chloe Sanchez MRN: [id number] DOB: 12/17/1940   Cancelled Treatment:    Reason Eval/Treat Not Completed: Other (comment) (See PT note for further details) PT attempted treatment today, however pt very lethargic and minimally responsive. O2 saturation: 90%, HR: 105. Pt did not state distress. Nurse notified and attributed pt state to recent medication. Will attempt at later time/date when appropriate.    Benna Dunks 07/18/2015, 9:52 AM  Benna Dunks, SPT. [phone]

## 2015-07-18 NOTE — Progress Notes (Signed)
Patient drowsy this am. Held methadone - md changed orders. Patient more alert this afternoon. Voiding post foley removed. Patient states pain remains at 10- chronic back pain.

## 2015-07-18 NOTE — Plan of Care (Signed)
Problem: Phase II Progression Outcomes Goal: Tolerating diet Outcome: Not Progressing Poor po intake

## 2015-07-18 NOTE — Addendum Note (Signed)
Addendum  created 07/18/15 1015 by Irving Burton, CRNA   Modules edited: Notes Section   Notes Section:  File: [id number]

## 2015-07-18 NOTE — Progress Notes (Signed)
  Subjective: 1 Day Post-Op Procedure(s) (LRB): TOTAL KNEE ARTHROPLASTY (Right) Patient reports pain as mild, recent pain medications received. Patient is well, and has had no acute complaints or problems Plan is to go Skilled nursing facility after hospital stay. Negative for chest pain and shortness of breath Fever: no Gastrointestinal:Negative for nausea and vomiting  Objective: Vital signs in last 24 hours: Temp:  [95.6 F (35.3 C)-98.7 F (37.1 C)] 98.7 F (37.1 C) (09/09 0520) Pulse Rate:  [60-107] 105 (09/09 0520) Resp:  [9-19] 19 (09/09 0520) BP: (115-163)/(52-89) 127/66 mmHg (09/09 0520) SpO2:  [93 %-100 %] 96 % (09/09 0520) Weight:  [66.679 kg (147 lb)] 66.679 kg (147 lb) (09/08 0758)  Intake/Output from previous day:  Intake/Output Summary (Last 24 hours) at 07/18/15 0753 Last data filed at 07/18/15 0600  Gross per 24 hour  Intake 4273.25 ml  Output   5010 ml  Net -736.75 ml    Intake/Output this shift:    Labs:  Recent Labs  07/17/15 1229 07/18/15 0557  HGB 9.7* 10.1*    Recent Labs  07/17/15 1229 07/18/15 0557  WBC 6.7 10.9  RBC 3.26* 3.45*  HCT 29.6* 30.9*  PLT 301 313    Recent Labs  07/17/15 1229 07/18/15 0557  NA  --  137  K  --  3.5  CL  --  101  CO2  --  28  BUN  --  15  CREATININE 1.19* 1.13*  GLUCOSE  --  161*  CALCIUM  --  8.7*   No results for input(s): LABPT, INR in the last 72 hours.   EXAM General - Patient is Appropriate.  Pt is drowsy but arrousable.  Able to answer questions pertaining to health. Extremity - ABD soft Sensation intact distally Dorsiflexion/Plantar flexion intact Incision: dressing C/D/I Dressing/Incision - clean, dry, no drainage Motor Function - intact, moving foot and toes well on exam.   Past Medical History  Diagnosis Date  . RLS (restless legs syndrome)   . Anxiety   . Arthritis   . Diabetes mellitus without complication   . Anemia   . Sleep apnea   . CHF (congestive heart failure)    . Hypothyroidism   . Depression   . Gout   . Sjogren's syndrome   . Polyneuropathy   . Hypertension   . On supplemental oxygen therapy     AT NIGHT  . Chronic kidney disease     CKD    Assessment/Plan: 1 Day Post-Op Procedure(s) (LRB): TOTAL KNEE ARTHROPLASTY (Right) Active Problems:   Primary osteoarthritis of knee  Estimated body mass index is 26.88 kg/(m^2) as calculated from the following:   Height as of this encounter:  (1.575 m).   Weight as of this encounter: 66.679 kg (147 lb). Advance diet Up with therapy   Foley has been removed. Pt has not had a BM yet, pt is POD1. PT recommending SNF, Care management will assist with discharge.  DVT Prophylaxis - Lovenox, Foot Pumps and TED hose Weight-Bearing as tolerated to right leg  J. Horris Latino, PA-C [facility] 07/18/2015, 7:53 AM

## 2015-07-18 NOTE — Evaluation (Signed)
Occupational Therapy Evaluation Patient Details Name: Chloe Sanchez MRN: [id number] DOB: 1941/08/03 Today's Date: 07/18/2015    History of Present Illness Pt is a 74 yo female who was admitted to the hospital s/p R TKA on 07/17/15   Clinical Impression   This patient is a 74 year old female who came to [facility] for a R total knee replacement.  Patient lives in a  home with a grown child that can not give 24/7 care.   She had been independent with ADL and functional mobility using assistive devices. She now requires  assistance and would benefit from Occupational Therapy for ADL/functioal mobility training.      Follow Up Recommendations  SNF    Equipment Recommendations       Recommendations for Other Services       Precautions / Restrictions Precautions Precautions: Fall Restrictions Weight Bearing Restrictions: Yes Other Position/Activity Restrictions: WBAT      Mobility Bed Mobility  Transfers    Balance                                          ADL                                         General ADL Comments: Patient had been independent with ADL before surgery with assistive devices. She now practiced techniques for lower body dressing using hip kit. hand over hand assist was used as patient very distracted by pain and lethargy at the same time. Patient did try to participate more but had difficulty. Nursing informed.     Vision     Perception     Praxis      Pertinent Vitals/Pain Pain Assessment: 0-10 Pain Score: 9  Pain Location: R knee Pain Intervention(s): Patient requesting pain meds-RN notified     Hand Dominance     Extremity/Trunk Assessment Upper Extremity Assessment Upper Extremity Assessment: Overall WFL for tasks assessed   Lower Extremity Assessment Lower Extremity Assessment: Defer to PT evaluation       Communication Communication Communication:  (No difficulties but  patient very distracted by pain and fatigue. Nursing informed.)   Cognition Arousal/Alertness:  (Combination of distraction from pain and falling asleep. Patient did try to participate better .) Behavior During Therapy: Restless Overall Cognitive Status:  (distracted)                     General Comments       Exercises   Shoulder Instructions      Home Living Family/patient expects to be discharged to:: Skilled nursing facility Living Arrangements: Children                               Additional Comments: Pt lives with child, but child cannot provide 24 hr assistance, and pt knows nobody who can      Prior Functioning/Environment Level of Independence: Independent with assistive device(s)        Comments: Pt was driving, ambulating community distances, and performing ADLs independently (with a cane for ambulation)    OT Diagnosis: Acute pain   OT Problem List:     OT Treatment/Interventions: Self-care/ADL training    OT Goals(Current goals  can be found in the care plan section) Acute Rehab OT Goals Patient Stated Goal: to do therapy OT Goal Formulation: With patient Time For Goal Achievement: 08/01/15 Potential to Achieve Goals: Good  OT Frequency: Min 1X/week   Barriers to D/C:            Co-evaluation              End of Session Equipment Utilized During Treatment:  (Hip kit)  Activity Tolerance: Patient limited by fatigue;Patient limited by pain Patient left: in bed;with call bell/phone within reach;with bed alarm set   Time: 8937-3428 OT Time Calculation (min): 15 min Charges:  OT General Charges $OT Visit: 1 Procedure OT Evaluation $Initial OT Evaluation Tier I: 1 Procedure G-Codes:    Myrene Galas, MS/OTR/L  07/18/2015, 3:59 PM

## 2015-07-18 NOTE — Anesthesia Postprocedure Evaluation (Signed)
  Anesthesia Post-op Note  Patient: Chloe Sanchez  Procedure(s) Performed: Procedure(s): TOTAL KNEE ARTHROPLASTY (Right)  Anesthesia type:General, MAC, Spinal  Patient location: 152  Post pain: Pain level controlled  Post assessment: Post-op Vital signs reviewed, [name] Cardiovascular Status Stable, Respiratory Function Stable, Patent Airway and No signs of Nausea or vomiting  Post vital signs: Reviewed and stable  Last Vitals:  Filed Vitals:   07/18/15 0855  BP: 119/68  Pulse: 108  Temp: 36.8 C  Resp: 18    Level of consciousness: awake, alert  and patient cooperative  Complications: No apparent anesthesia complications

## 2015-07-18 NOTE — Clinical Social Work Note (Signed)
Clinical Social Work Assessment  Patient Details  Name: Chloe Sanchez MRN: [id number] Date of Birth: 02/12/1941  Date of referral:  07/18/15               Reason for consult:  Facility Placement                Permission sought to share information with:  Family Supports Permission granted to share information::  Yes, Verbal Permission Granted  Name::      (sonRomeo Apple Sanchez/ [phone])  Agency::   ([facility])  Relationship::   (son- Chloe Sanchez)  Solicitor Information:     Housing/Transportation Living arrangements for the past 2 months:  Single Family Home Source of Information:  Adult Children Patient Interpreter Needed:  None Criminal Activity/Legal Involvement Pertinent to Current Situation/Hospitalization:  No - Comment as needed Significant Relationships:  Adult Children, Other(Comment) ([facility] team) Lives with:  Adult Children Do you feel safe going back to the place where you live?  No Need for family participation in patient care:  Yes (Comment)  Care giving concerns:  SNF recommended by MD and PT.   Social Worker assessment / plan:  CSW spoke with [facility] team who has completed FL2 and PASARR for SNF bed [facility]  [facility] and can accept over weekend if stable.  Employment status:  Retired Health and safety inspector:  Medicaid In [place] PT Recommendations:  Skilled Nursing Facility Information / Referral to community resources:  Skilled Nursing Facility  Patient/Family's Response to care:  Son agrees with plans. He is appreciative to the care.  Patient/Family's Understanding of and Emotional Response to Diagnosis, Current Treatment, and Prognosis:  Patient unable to respond (unarousable) but son understands and agrees.   Emotional Assessment Appearance:  Appears older than stated age, Other (Comment Required Attitude/Demeanor/Rapport:  Unable to Assess Affect (typically observed):  Unable to Assess Orientation:  Fluctuating Orientation  (Suspected and/or reported Sundowners) Alcohol / Substance use:  Not Applicable Psych involvement (Current and /or in the community):  No (Comment)  Discharge Needs  Concerns to be addressed:  Discharge Planning Concerns Readmission within the last 30 days:  No Current discharge risk:  Physical Impairment Barriers to Discharge:  No Barriers Identified   Chloe Cline, LCSW 07/18/2015, 3:01 PM

## 2015-07-18 NOTE — Care Management Note (Signed)
Case Management Note  Patient Details  Name: Chloe Sanchez MRN: [id number] Date of Birth: 12-14-1940  Subjective/Objective:   Patient for SNF. CSW aware and following                 Action/Plan:   Expected Discharge Date:  07/20/15               Expected Discharge Plan:  Skilled Nursing Facility  In-House Referral:  Clinical Social Work  Discharge planning Services     Post Acute Care Choice:    Choice offered to:     DME Arranged:    DME Agency:     HH Arranged:    HH Agency:     Status of Service:  In process, will continue to follow  Medicare Important Message Given:    Date Medicare IM Given:    Medicare IM give by:    Date Additional Medicare IM Given:    Additional Medicare Important Message give by:     If discussed at Long Length of Stay Meetings, dates discussed:    Additional Comments:  Marily Memos, RN 07/18/2015, 9:57 AM

## 2015-07-19 NOTE — Progress Notes (Signed)
Physical Therapy Treatment Patient Details Name: Chloe Sanchez MRN: [id number] DOB: 15-Jan-1941 Today's Date: 07/19/2015    History of Present Illness Pt is a 74 yo female who was admitted to the hospital s/p R TKA on 07/17/15    PT Comments    Patient reports increased right knee pain during treatment session. She was more guarded during exercise, limiting RLE knee ROM due to pain as compared to previous sessions. Patient required increased cues with bed mobility/transfers, but required less physical assistance. She is supervision for bed mobility, mod A +1 for sit<>stand transfer from bed and min A for gait tasks. Patient demonstrates increased antalgic gait today with short stance time on right and decreased LLE step length. Patient would benefit from additional skilled PT Intervention to improve LE strength, ROM and mobility.   Follow Up Recommendations  SNF     Equipment Recommendations  Rolling walker with 5" wheels    Recommendations for Other Services       Precautions / Restrictions Precautions Precautions: Fall Restrictions Weight Bearing Restrictions: Yes Other Position/Activity Restrictions: WBAT    Mobility  Bed Mobility Overal bed mobility: Needs Assistance Bed Mobility: Supine to Sit;Sit to Supine     Supine to sit: Supervision Sit to supine: Supervision   General bed mobility comments: Patient requires min VCs for hand placement and positioning,but is able to initiate RLE movement independently; patient very slow with bed mobility;   Transfers Overall transfer level: Needs assistance Equipment used: Rolling walker (2 wheeled) Transfers: Sit to/from Stand Sit to Stand: Mod assist         General transfer comment: Patient is mod A +1 for sit<>stand transfer from bed with RW with min VCs for hand placement and LE positioning;   Ambulation/Gait Ambulation/Gait assistance: Min assist Ambulation Distance (Feet): 20 Feet Assistive device: Rolling walker (2  wheeled) Gait Pattern/deviations: Step-to pattern;Step-through pattern;Decreased step length - right;Decreased step length - left;Decreased dorsiflexion - right;Decreased dorsiflexion - left Gait velocity: decreased   General Gait Details: Demonstrates decreased step length bilaterally; able to progress to step through pattern with increased gait tasks with less stiffness. patient demonstrates antalgic gait pattern with short stance time on right and decreased step length on left;    Stairs            Wheelchair Mobility    Modified Rankin (Stroke Patients Only)       Balance                                    Cognition Arousal/Alertness: Awake/alert Behavior During Therapy: WFL for tasks assessed/performed Overall Cognitive Status: Within Functional Limits for tasks assessed                      Exercises Total Joint Exercises Goniometric ROM: R knee AAROM: 10-65 degrees in supine General Exercises - Lower Extremity Short Arc Quad: AROM;Right;10 reps;Supine Heel Slides: AROM;Both;10 reps;Supine Hip ABduction/ADduction: AROM;Both;10 reps;Supine Straight Leg Raises: AROM;Both;10 reps;Supine Other Exercises Other Exercises: Patient required min VCs for correct exercise technique; she reports increased pain in right knee with heel slides requiring AAROM; patient more guarded today with RLE knee flexion due to pain.     General Comments        Pertinent Vitals/Pain Pain Assessment: 0-10 Pain Score: 9  Pain Location: right knee Pain Descriptors / Indicators: Aching;Sore Pain Intervention(s): Limited activity within patient's tolerance;Monitored during session;Repositioned;Ice  applied    Home Living                      Prior Function            PT Goals (current goals can now be found in the care plan section) Acute Rehab PT Goals Patient Stated Goal: to do therapy PT Goal Formulation: With patient Time For Goal Achievement:  07/31/15 Potential to Achieve Goals: Good Progress towards PT goals: Progressing toward goals    Frequency  BID    PT Plan Current plan remains appropriate    Co-evaluation PT/OT/SLP Co-Evaluation/Treatment: Yes Reason for Co-Treatment: For patient/therapist safety   OT goals addressed during session: ADL's and self-care;Proper use of Adaptive equipment and DME     End of Session Equipment Utilized During Treatment: Gait belt Activity Tolerance: Patient tolerated treatment well Patient left: in bed;with call bell/phone within reach;with bed alarm set;with SCD's reapplied     Time: 1450-1520 PT Time Calculation (min) (ACUTE ONLY): 30 min  Charges:  $Gait Training: 8-22 mins $Therapeutic Exercise: 8-22 mins                    G Codes:      Hopkins,Ana Liaw  PT, DPT  07/19/2015, 3:26 PM

## 2015-07-19 NOTE — Plan of Care (Signed)
Problem: Phase I Progression Outcomes Goal: CMS/Neurovascular status WDL Outcome: Progressing Neuro checks wdl Goal: Pain controlled with appropriate interventions Outcome: Progressing Oral pain medication for pain control Goal: Dangle or out of bed evening of surgery Outcome: Progressing Able to sit on the side of the bed with assistance

## 2015-07-19 NOTE — Progress Notes (Signed)
Occupational Therapy Treatment Patient Details Name: Chloe Sanchez MRN: [id number] DOB: [birth year] Today's Date: 07/19/2015    History of present illness Pt is a 74 yo female who was admitted to the hospital s/p R TKA on 07/17/15   OT comments  Pt limited by pain and fatigue - show progress in functional mobility compare to last time - LB dressing still limited and in need for AE trng - sounds daughter assisted her prior to injury  Pt can benefit from cont OT services to increase independence in ADL's and IADL's  Follow Up Recommendations       Equipment Recommendations       Recommendations for Other Services      Precautions / Restrictions Precautions Precautions: Fall Restrictions Weight Bearing Restrictions: Yes Other Position/Activity Restrictions: WBAT       Mobility Bed Mobility Overal bed mobility: Needs Assistance Bed Mobility: Supine to Sit     Supine to sit: Mod assist     General bed mobility comments: Patient required min VCs for hand placement; required assistance for mobility LE over side of bed;   Transfers Overall transfer level: Needs assistance Equipment used: Rolling walker (2 wheeled) Transfers: Sit to/from Stand Sit to Stand: Mod assist;+2 physical assistance         General transfer comment: Patient transferred from toilet, sit<>Stand with RW, min A +1 demonstrating better safety with elevated toilet.    Balance                                   ADL                                         General ADL Comments: Toiletting to bathroom using BSC- Min A ; LB dressing using AE for bathing and dressing - socks mod A - did not had pants   USed Sock aid and Forensic psychologist   Behavior During Therapy: WFL for tasks assessed/performed Overall Cognitive Status: Within Functional Limits for tasks assessed                        Extremity/Trunk Assessment               Exercises Other Exercises Other Exercises: Patient was instructed in seated knee extension LAQ x10 RLE x2 sets with min VCs to increase ROM for better strengthening;   Shoulder Instructions       General Comments      Pertinent Vitals/ Pain       Pain Assessment: 0-10 Pain Score: 6  Pain Location: right knee Pain Descriptors / Indicators: Aching;Burning;Tightness Pain Intervention(s): Limited activity within patient's tolerance;Monitored during session;Repositioned  Home Living Family/patient expects to be discharged to:: Skilled nursing facility Living Arrangements: Children                               Additional Comments: Pt lives with child, but child cannot provide 24 hr assistance, and pt knows nobody who can      Prior Functioning/Environment Level of  Independence: Independent with assistive device(s)        Comments: Pt was driving, ambulating community distances, and performing ADLs independently (with a cane for ambulation)   Frequency       Progress Toward Goals  OT Goals(current goals can now be found in the care plan section)     Acute Rehab OT Goals Patient Stated Goal: to do therapy  Plan      Co-evaluation    PT/OT/SLP Co-Evaluation/Treatment: Yes Reason for Co-Treatment: For patient/therapist safety PT goals addressed during session: Mobility/safety with mobility;Balance;Strengthening/ROM;Proper use of DME OT goals addressed during session: ADL's and self-care;Proper use of Adaptive equipment and DME      End of Session Equipment Utilized During Treatment: Rolling walker   Activity Tolerance Patient limited by fatigue;Patient limited by pain   Patient Left in bed;with call bell/phone within reach;with bed alarm set   Nurse Communication          Time: 1005-1040 OT Time Calculation (min): 35 min  Charges: OT General Charges $OT Visit: 1 Procedure OT Treatments $Self  Care/Home Management : 23-37 mins  Lillard Bailon OTR/L,CLT 07/19/2015, 1:43 PM

## 2015-07-19 NOTE — Progress Notes (Signed)
   Subjective: 2 Days Post-Op Procedure(s) (LRB): TOTAL KNEE ARTHROPLASTY (Right) Patient reports pain as 6 on 0-10 scale.   Patient is well, and has had no acute complaints or problems Continue with physical therapy today.  Plan is to go Rehab after hospital stay. no nausea and no vomiting Patient denies any chest pains or shortness of breath. Objective: Vital signs in last 24 hours: Temp:  [98.8 F (37.1 C)-99.8 F (37.7 C)] 99.8 F (37.7 C) (09/10 0825) Pulse Rate:  [99-124] 116 (09/10 0825) Resp:  [18-19] 18 (09/10 0825) BP: (134-170)/(59-94) 156/82 mmHg (09/10 0825) SpO2:  [93 %-99 %] 98 % (09/10 0825) well approximated incision Heels are non tender and elevated off the bed using rolled towels Intake/Output from previous day: 09/09 0701 - 09/10 0700 In: 1650 [I.V.:1650] Out: 1500 [Urine:1500] Intake/Output this shift: Total I/O In: -  Out: 650 [Urine:650]   Recent Labs  07/17/15 1229 07/18/15 0557  HGB 9.7* 10.1*    Recent Labs  07/17/15 1229 07/18/15 0557  WBC 6.7 10.9  RBC 3.26* 3.45*  HCT 29.6* 30.9*  PLT 301 313    Recent Labs  07/17/15 1229 07/18/15 0557  NA  --  137  K  --  3.5  CL  --  101  CO2  --  28  BUN  --  15  CREATININE 1.19* 1.13*  GLUCOSE  --  161*  CALCIUM  --  8.7*   No results for input(s): LABPT, INR in the last 72 hours.  EXAM General - Patient is Alert, Appropriate and Oriented Extremity - Neurologically intact Neurovascular intact Sensation intact distally Intact pulses distally Dorsiflexion/Plantar flexion intact Dressing - dressing C/D/I Motor Function - intact, moving foot and toes well on exam.    Past Medical History  Diagnosis Date  . RLS (restless legs syndrome)   . Anxiety   . Arthritis   . Diabetes mellitus without complication   . Anemia   . Sleep apnea   . CHF (congestive heart failure)   . Hypothyroidism   . Depression   . Gout   . Sjogren's syndrome   . Polyneuropathy   . Hypertension   .  On supplemental oxygen therapy     AT NIGHT  . Chronic kidney disease     CKD    Assessment/Plan: 2 Days Post-Op Procedure(s) (LRB): TOTAL KNEE ARTHROPLASTY (Right) Active Problems:   Primary osteoarthritis of knee  Estimated body mass index is 26.88 kg/(m^2) as calculated from the following:   Height as of this encounter:  (1.575 m).   Weight as of this encounter: 66.679 kg (147 lb). Up with therapy Discharge to SNF Sunday or Monday  Labs: Were reviewed DVT Prophylaxis - Lovenox, Foot Pumps and TED hose Weight-Bearing as tolerated to right leg Dressing changes on today's visit Patient needs to have a bowel movement today  Kalianne Fetting R. [facility] PA [facility] 07/19/2015, 9:54 AM

## 2015-07-19 NOTE — Progress Notes (Signed)
Physical Therapy Treatment Patient Details Name: Chloe Sanchez MRN: [id number] DOB: Apr 05, 1941 Today's Date: 07/19/2015    History of Present Illness Pt is a 74 yo female who was admitted to the hospital s/p R TKA on 07/17/15    PT Comments    Patient very slow with start of therapy due to knee soreness and fatigue. She was able to walk to bathroom and demonstrate good toilet transfer with less assistance from elevated commode (minA) as compared to lower bed height (mod A +2). Patient also demonstrates better gait ability progressing to reciprocal gait pattern with RW as RLE knee loosens up. She would benefit from additional skilled PT intervention to improve balance/gait safety and reduce knee discomfort.  Follow Up Recommendations  SNF     Equipment Recommendations  Rolling walker with 5" wheels    Recommendations for Other Services       Precautions / Restrictions Precautions Precautions: Fall Restrictions Weight Bearing Restrictions: Yes Other Position/Activity Restrictions: WBAT    Mobility  Bed Mobility Overal bed mobility: Needs Assistance Bed Mobility: Supine to Sit     Supine to sit: Mod assist     General bed mobility comments: Patient required min VCs for hand placement; required assistance for mobility LE over side of bed;   Transfers Overall transfer level: Needs assistance Equipment used: Rolling walker (2 wheeled) Transfers: Sit to/from Stand Sit to Stand: Mod assist;+2 physical assistance         General transfer comment: Patient transferred from low bed, mod A +2 required frequent cues for hand placement and to advance RLE forward prior to pushing through LLE; Patient transferred from toilet, sit<>Stand with RW, min A +1 demonstrating better safety with elevated toilet.  Ambulation/Gait Ambulation/Gait assistance: Mod assist Ambulation Distance (Feet): 25 Feet Assistive device: Rolling walker (2 wheeled) Gait Pattern/deviations: Step-to  pattern;Step-through pattern;Decreased step length - right;Decreased step length - left Gait velocity: decreased   General Gait Details: Patient initiated gait with step to pattern, very slow gait with forward flexed posture x10 feet; then progressed to step through gait pattern, improved posture and better step length x15 feet;    Stairs            Wheelchair Mobility    Modified Rankin (Stroke Patients Only)       Balance                                    Cognition Arousal/Alertness: Awake/alert Behavior During Therapy: WFL for tasks assessed/performed Overall Cognitive Status: Within Functional Limits for tasks assessed                      Exercises Other Exercises Other Exercises: Patient was instructed in seated knee extension LAQ x10 RLE x2 sets with min VCs to increase ROM for better strengthening;    General Comments        Pertinent Vitals/Pain Pain Assessment: 0-10 Pain Score: 6  Pain Location: right knee Pain Descriptors / Indicators: Aching;Burning;Tightness Pain Intervention(s): Limited activity within patient's tolerance;Monitored during session;Repositioned    Home Living Family/patient expects to be discharged to:: Skilled nursing facility Living Arrangements: Children             Additional Comments: Pt lives with child, but child cannot provide 24 hr assistance, and pt knows nobody who can    Prior Function Level of Independence: Independent with assistive device(s)  Comments: Pt was driving, ambulating community distances, and performing ADLs independently (with a cane for ambulation)   PT Goals (current goals can now be found in the care plan section) Acute Rehab PT Goals Patient Stated Goal: to do therapy PT Goal Formulation: With patient Time For Goal Achievement: 07/31/15 Potential to Achieve Goals: Good Progress towards PT goals: Progressing toward goals    Frequency  BID    PT Plan Current  plan remains appropriate    Co-evaluation PT/OT/SLP Co-Evaluation/Treatment: Yes Reason for Co-Treatment: For patient/therapist safety PT goals addressed during session: Mobility/safety with mobility;Balance;Strengthening/ROM;Proper use of DME OT goals addressed during session: ADL's and self-care;Proper use of Adaptive equipment and DME     End of Session Equipment Utilized During Treatment: Gait belt Activity Tolerance: Patient tolerated treatment well Patient left: with call bell/phone within reach;in chair;with chair alarm set     Time: 1010-1050 PT Time Calculation (min) (ACUTE ONLY): 40 min  Charges:  $Gait Training: 8-22 mins $Therapeutic Exercise: 8-22 mins                    G Codes:      Hopkins,Margaret PT, DPT 07/19/2015, 12:55 PM

## 2015-07-20 DIAGNOSIS — M1711 Unilateral primary osteoarthritis, right knee: Secondary | ICD-10-CM | POA: Diagnosis not present

## 2015-07-20 NOTE — Plan of Care (Signed)
Problem: Discharge Progression Outcomes Goal: Other Discharge Outcomes/Goals Outcome: Progressing For discharge to [facility]  Pt very sleepy today not able to work with physical therapy  Lyrica, gabapentin and methadone held today because pt very sleepy

## 2015-07-20 NOTE — Progress Notes (Signed)
Pt assisted to turn for back to side , pt incontinent kof bladder , suppository given for constipation, pt still hard to arouse ,meds held as ordered for this shift

## 2015-07-20 NOTE — Progress Notes (Signed)
   Subjective: 3 Days Post-Op Procedure(s) (LRB): TOTAL KNEE ARTHROPLASTY (Right)  Unable to rate pain. Patient was unable to be aroused today.She did respond to pain stimuli..   Patient is well, and has had no acute complaints or problems Continue with physical therapy today.  Plan is to go Rehab after hospital stay. no nausea and no vomiting Patient denies any chest pains or shortness of breath. Objective: Vital signs in last 24 hours: Temp:  [97.9 F (36.6 C)-99.8 F (37.7 C)] 99.6 F (37.6 C) (09/11 0449) Pulse Rate:  [108-117] 108 (09/11 0449) Resp:  [16-18] 16 (09/11 0449) BP: (115-156)/(60-82) 115/60 mmHg (09/11 0449) SpO2:  [92 %-98 %] 96 % (09/11 0449) well approximated incision Heels are non tender and elevated off the bed using rolled towels Intake/Output from previous day: 09/10 0701 - 09/11 0700 In: 1013.8 [P.O.:240; I.V.:773.8] Out: 850 [Urine:850] Intake/Output this shift:     Recent Labs  07/17/15 1229 07/18/15 0557  HGB 9.7* 10.1*    Recent Labs  07/17/15 1229 07/18/15 0557  WBC 6.7 10.9  RBC 3.26* 3.45*  HCT 29.6* 30.9*  PLT 301 313    Recent Labs  07/17/15 1229 07/18/15 0557  NA  --  137  K  --  3.5  CL  --  101  CO2  --  28  BUN  --  15  CREATININE 1.19* 1.13*  GLUCOSE  --  161*  CALCIUM  --  8.7*   No results for input(s): LABPT, INR in the last 72 hours.  EXAM General - Patient is Resting very well. Would not awaken to verbal command but would respond to stimuli      Extremity - Knee flexed approximately 20. Knee portion of the bed was flexed. Mild swelling the lower extremity no appreciable edema. Dressing - dressing C/D/I Motor Function - intact, moving foot and toes well on exam.   Past Medical History  Diagnosis Date  . RLS (restless legs syndrome)   . Anxiety   . Arthritis   . Diabetes mellitus without complication   . Anemia   . Sleep apnea   . CHF (congestive heart failure)   . Hypothyroidism   . Depression    . Gout   . Sjogren's syndrome   . Polyneuropathy   . Hypertension   . On supplemental oxygen therapy     AT NIGHT  . Chronic kidney disease     CKD    Assessment/Plan: 3 Days Post-Op Procedure(s) (LRB): TOTAL KNEE ARTHROPLASTY (Right) Active Problems:   Primary osteoarthritis of knee  Estimated body mass index is 26.88 kg/(m^2) as calculated from the following:   Height as of this encounter:  (1.575 m).   Weight as of this encounter: 66.679 kg (147 lb). Up with therapy Discharge to SNF when medically cleared  Labs: Were reviewed DVT Prophylaxis - Lovenox, Foot Pumps and TED hose Weight-Bearing as tolerated to right leg Patient needs to have a bowel movement today.   Lynnda Shields. [facility] PA [facility] 07/20/2015, 7:20 AM

## 2015-07-20 NOTE — Progress Notes (Signed)
Pt woke up for dinner visited with family,  Now sleeping , no complaints, polar care to rt knee , dressing rt knee honeycomb no drainage

## 2015-07-20 NOTE — Progress Notes (Signed)
Pt sleeping arouses to voice methadone held till pt more awake

## 2015-07-20 NOTE — Progress Notes (Signed)
Pt unable to stay awake to eat

## 2015-07-20 NOTE — Progress Notes (Signed)
Physical Therapy Treatment Patient Details Name: Chloe Sanchez MRN: [id number] DOB: 1941/03/26 Today's Date: 07/20/2015    History of Present Illness Pt is a 74 yo female who was admitted to the hospital s/p R TKA on 07/17/15    PT Comments    Patient very lethargic today. She had difficulty waking up and difficulty participating in PT treatment. Patient required frequent cues to wake up and participate with exercise. Patient had difficulty following single step commands. Instructed patient in bed mobility and attempted standing transfer. Patient unable to scoot forward to edge of bed. RN came in and requested that patient go back to bed as she was going to give her a suppository. Patient left in room in bed with bed alarm on. Patient would benefit from additional skilled PT intervention to improve LE strength, balance/gait safety and reduce knee discomfort.  Follow Up Recommendations  SNF     Equipment Recommendations  Rolling walker with 5" wheels    Recommendations for Other Services       Precautions / Restrictions Precautions Precautions: Fall Restrictions Weight Bearing Restrictions: Yes Other Position/Activity Restrictions: WBAT    Mobility  Bed Mobility Overal bed mobility: Needs Assistance Bed Mobility: Supine to Sit;Sit to Supine     Supine to sit: Mod assist Sit to supine: Mod assist   General bed mobility comments: Patient required frequent verbal cues and mod A +1 for transitioning supine to sit. Patient encouraged to sit edge of bed but required mod A for scooting and was very lethargic. She had difficulty scooting forward and would sit on bed upright holding RLE knee.   Transfers                 General transfer comment: Pt unable to perform transfer at this time due to fatigue and difficulty following commands  Ambulation/Gait                 Stairs            Wheelchair Mobility    Modified Rankin (Stroke Patients Only)        Balance Overall balance assessment: Needs assistance Sitting-balance support: Bilateral upper extremity supported Sitting balance-Leahy Scale: Fair Sitting balance - Comments: Patient able to sit up in bed with feet off floor, hand supported on bed without assistance demonstrating erect posture;                            Cognition Arousal/Alertness: Lethargic   Overall Cognitive Status: Impaired/Different from baseline (patient lethargic and had difficulty following commands)                      Exercises Other Exercises Other Exercises: attempted BLE strengthening exercise, but patient unable to follow commands due to fatigue    General Comments        Pertinent Vitals/Pain Pain Location: Patient unable to rate pain at this time    Home Living                      Prior Function            PT Goals (current goals can now be found in the care plan section) Acute Rehab PT Goals Patient Stated Goal: to do therapy PT Goal Formulation: With patient Time For Goal Achievement: 07/31/15 Potential to Achieve Goals: Good Progress towards PT goals: Not progressing toward goals - comment (Patient lethargic today; RN  aware and has held pain meds per MD order)    Frequency  BID    PT Plan Current plan remains appropriate    Co-evaluation             End of Session   Activity Tolerance: Patient limited by lethargy;Patient limited by fatigue Patient left: in bed;with call bell/phone within reach;with bed alarm set;with SCD's reapplied     Time: 1610-9604 PT Time Calculation (min) (ACUTE ONLY): 22 min  Charges:  $Therapeutic Activity: 8-22 mins                    G Codes:      Hopkins,Tarena Gockley PT, DPT 07/20/2015, 10:25 AM

## 2015-07-20 NOTE — Clinical Social Work Note (Signed)
CSW noted that patient was more lethargic this morning and thus was not for discharge by the MD. CSW will follow up with [facility] and [facility] tomorrow. York Spaniel MSW,LCSW [phone]

## 2015-07-20 NOTE — Progress Notes (Signed)
Pt continues to be very sleepy able to arouse to voice and shaking, Dr Ernest Pine notified orders received to hold lyrica, gabapentin and methadone until pt becomes more awake for today doses

## 2015-07-21 LAB — SURGICAL PATHOLOGY

## 2015-07-21 MED ORDER — OXYCODONE HCL 5 MG PO TABS: 5.0000 mg | ORAL_TABLET | ORAL | Status: DC | PRN

## 2015-07-21 MED ORDER — METHADONE HCL 10 MG PO TABS: 30.0000 mg | ORAL_TABLET | Freq: Every day | ORAL | Status: AC

## 2015-07-21 MED ORDER — METHADONE HCL 10 MG PO TABS: 40.0000 mg | ORAL_TABLET | Freq: Every day | ORAL | Status: DC

## 2015-07-21 MED ORDER — PREGABALIN 150 MG PO CAPS: 150.0000 mg | ORAL_CAPSULE | Freq: Two times a day (BID) | ORAL | Status: AC

## 2015-07-21 MED ORDER — ENOXAPARIN SODIUM 40 MG/0.4ML ~~LOC~~ SOLN: 40.0000 mg | SUBCUTANEOUS | Status: DC

## 2015-07-21 MED ORDER — METHADONE HCL 10 MG PO TABS: 10.0000 mg | ORAL_TABLET | Freq: Every day | ORAL | Status: AC

## 2015-07-21 NOTE — Discharge Instructions (Signed)

## 2015-07-21 NOTE — Progress Notes (Addendum)
   Subjective: 4 Days Post-Op Procedure(s) (LRB): TOTAL KNEE ARTHROPLASTY (Right) Patient reports pain as 8 on 0-10 scale.   Patient is well, and has had no acute complaints or problems. No CP/SOB/Abd pain We will continue with physical therapy today.  Plan is to go Rehab after hospital stay.  Objective: Vital signs in last 24 hours: Temp:  [98.6 F (37 C)-99.5 F (37.5 C)] 99.5 F (37.5 C) (09/12 0422) Pulse Rate:  [100-115] 115 (09/12 0422) Resp:  [16-18] 18 (09/12 0422) BP: (117-139)/(61-72) 117/61 mmHg (09/12 0422) SpO2:  [90 %-95 %] 90 % (09/12 0422)  Intake/Output from previous day: 09/11 0701 - 09/12 0700 In: 480 [P.O.:480] Out: 0  Intake/Output this shift:    No results for input(s): HGB in the last 72 hours. No results for input(s): WBC, RBC, HCT, PLT in the last 72 hours. No results for input(s): NA, K, CL, CO2, BUN, CREATININE, GLUCOSE, CALCIUM in the last 72 hours. No results for input(s): LABPT, INR in the last 72 hours.  EXAM General - Patient is Alert, Appropriate and Oriented. Sleeping, easily awakened. No signs of confusion. Extremity - Neurovascular intact Sensation intact distally Intact pulses distally Dressing - dressing C/D/I and no drainage Motor Function - intact, right ankle fusion, limited ankle PF/DF  Past Medical History  Diagnosis Date  . RLS (restless legs syndrome)   . Anxiety   . Arthritis   . Diabetes mellitus without complication   . Anemia   . Sleep apnea   . CHF (congestive heart failure)   . Hypothyroidism   . Depression   . Gout   . Sjogren's syndrome   . Polyneuropathy   . Hypertension   . On supplemental oxygen therapy     AT NIGHT  . Chronic kidney disease     CKD    Assessment/Plan:   4 Days Post-Op Procedure(s) (LRB): TOTAL KNEE ARTHROPLASTY (Right) Active Problems:   Primary osteoarthritis of knee  Estimated body mass index is 26.88 kg/(m^2) as calculated from the following:   Height as of this encounter:   (1.575 m).   Weight as of this encounter: 66.679 kg (147 lb). Advance diet Up with therapy  Needs BM Discharge to rehab today pending BM  DVT Prophylaxis - Lovenox, Foot Pumps and TED hose Weight-Bearing as tolerated to right leg D/C O2 and Pulse OX and try on Room Air  T. Cranston Neighbor, PA-C [facility] 07/21/2015, 7:59 AM

## 2015-07-21 NOTE — Progress Notes (Signed)
Physical Therapy Treatment Patient Details Name: Chloe Sanchez MRN: [id number] DOB: [birth year] Today's Date: 07/21/2015    History of Present Illness Pt is a 74 yo female who was admitted to the hospital s/p R TKA on 07/17/15    PT Comments    Pt ready for transfer to skilled nursing facility. Pt does not wish out of bed, but agreeable to exercises/stretching. Pt complains of pain throughout session both in R knee and back of head. Pt contacted nursing for pain medication. Pt range limited and pt does not like stretching. Assist provided for several exercises to encourage range/strength.   Follow Up Recommendations  SNF     Equipment Recommendations  Rolling walker with 5" wheels    Recommendations for Other Services       Precautions / Restrictions Restrictions Weight Bearing Restrictions: Yes Other Position/Activity Restrictions: WBAT    Mobility  Bed Mobility                  Transfers                 General transfer comment: Pt does not wish up; ready for transfer  Ambulation/Gait                 Stairs            Wheelchair Mobility    Modified Rankin (Stroke Patients Only)       Balance                                    Cognition Arousal/Alertness: Awake/alert Behavior During Therapy: WFL for tasks assessed/performed Overall Cognitive Status: Difficult to assess                      Exercises Total Joint Exercises Ankle Circles/Pumps: AROM;Both;20 reps;Supine Quad Sets: Strengthening;Both;20 reps;Supine Short Arc Quad: AAROM;Right;20 reps;Supine (AROM on L) Heel Slides: AAROM;Right;20 reps;Supine (AROM on L) Hip ABduction/ADduction: AROM;Both;20 reps;Supine Straight Leg Raises: AAROM;Both;20 reps;Supine Knee Flexion: AAROM;Right;10 reps;Supine (3 position each rep with hold into stretch) Goniometric ROM: -7 to 75    General Comments        Pertinent Vitals/Pain Pain Assessment: 0-10 Pain  Score: 7  Pain Location: R knee    Home Living                      Prior Function            PT Goals (current goals can now be found in the care plan section) Progress towards PT goals: Progressing toward goals (slow)    Frequency  BID    PT Plan Current plan remains appropriate    Co-evaluation             End of Session   Activity Tolerance: Patient limited by pain Patient left: in bed;with call bell/phone within reach;with bed alarm set;with SCD's reapplied (cold pack in place)     Time: 4098-1191 PT Time Calculation (min) (ACUTE ONLY): 23 min  Charges:  $Therapeutic Exercise: 23-37 mins                    G Codes:      Kristeen Miss 07/21/2015, 11:35 AM

## 2015-07-21 NOTE — Discharge Summary (Signed)
Physician Discharge Summary  Patient ID: Chloe FELLINGRN: [id number] DOB/AGE: 74/01/1941 74 y.o.  Admit date: 07/17/2015 Discharge date: 07/21/2015  Admission Diagnoses:  OSTEOARTHRITIS   Discharge Diagnoses: Patient Active Problem List   Diagnosis Date Noted  . Primary osteoarthritis of knee 07/17/2015    Past Medical History  Diagnosis Date  . RLS (restless legs syndrome)   . Anxiety   . Arthritis   . Diabetes mellitus without complication   . Anemia   . Sleep apnea   . CHF (congestive heart failure)   . Hypothyroidism   . Depression   . Gout   . Sjogren's syndrome   . Polyneuropathy   . Hypertension   . On supplemental oxygen therapy     AT NIGHT  . Chronic kidney disease     CKD     Transfusion: none   Consultants (if any):    Discharged Condition: Improved  Hospital Course: Chloe Sanchez is an 74 y.o. female who was admitted 07/17/2015 with a diagnosis of right knee OA and went to the operating room on 07/17/2015 and underwent the above named procedures.    Surgeries: Procedure(s): TOTAL KNEE ARTHROPLASTY on 07/17/2015 Patient tolerated the surgery well. Taken to PACU where she was stabilized and then transferred to the orthopedic floor.  Started on Lovenox 30 q 12 hrs. Foot pumps applied bilaterally at 80 mm. Heels elevated on bed with rolled towels. No evidence of DVT. Negative Homan. Physical therapy started on day #1 for gait training and transfer. OT started day #1 for ADL and assisted devices.  [name] foley was d/c on day #1. [name] IV and hemovac was d/c on day #2. On post op day 3, patient was very sedated. Sedating medications were held, on post op day 4 patient was alert and oriented x 3 and at her baseline. Vital signs were stable.  On post op day #4, after bowel movement, patient was stable and ready for discharge to rehab facility.  Implants: Medacta 3+ GMK sphere, 3 tibia with 17 mm insert and 1 patella all components cemented  She was  given perioperative antibiotics:  Anti-infectives    Start     Dose/Rate Route Frequency Ordered Stop   07/17/15 1315  doxycycline (VIBRA-TABS) tablet 50 mg     50 mg Oral Daily 07/17/15 1309     07/17/15 1215  doxycycline (VIBRAMYCIN) 50 MG capsule 50 mg  Status:  Discontinued     50 mg Oral Daily 07/17/15 1213 07/17/15 1309   07/17/15 1215  ceFAZolin (ANCEF) IVPB 2 g/50 mL premix     2 g 100 mL/hr over 30 Minutes Intravenous Every 6 hours 07/17/15 1213 07/18/15 0056   07/17/15 0726  ceFAZolin (ANCEF) IVPB 2 g/50 mL premix     2 g 100 mL/hr over 30 Minutes Intravenous On call to O.R. 07/17/15 0726 07/17/15 0916   07/17/15 0715  ceFAZolin (ANCEF) 2-3 GM-% IVPB SOLR    Comments:  LEWIS, CINDY: cabinet override      07/17/15 0715 07/17/15 1914    .  She was given sequential compression devices, early ambulation, and lovenox for DVT prophylaxis.  She benefited maximally from the hospital stay and there were no complications.    Recent vital signs:  Filed Vitals:   07/21/15 0422  BP: 117/61  Pulse: 115  Temp: 99.5 F (37.5 C)  Resp: 18    Recent laboratory studies:  Lab Results  Component Value Date   HGB 10.1* 07/18/2015  HGB 9.7* 07/17/2015   HGB 9.9* 07/09/2015   Lab Results  Component Value Date   WBC 10.9 07/18/2015   PLT 313 07/18/2015   Lab Results  Component Value Date   INR 1.02 07/09/2015   Lab Results  Component Value Date   NA 137 07/18/2015   K 3.5 07/18/2015   CL 101 07/18/2015   CO2 28 07/18/2015   BUN 15 07/18/2015   CREATININE 1.13* 07/18/2015   GLUCOSE 161* 07/18/2015    Discharge Medications:     Medication List    TAKE these medications        albuterol 108 (90 BASE) MCG/ACT inhaler  Commonly known as:  PROVENTIL HFA;VENTOLIN HFA  Inhale 2 puffs into the lungs every 4 (four) hours as needed for wheezing or shortness of breath.     allopurinol 100 MG tablet  Commonly known as:  ZYLOPRIM  Take 100 mg by mouth daily.      amitriptyline 50 MG tablet  Commonly known as:  ELAVIL  Take 50 mg by mouth at bedtime.     amLODipine-atorvastatin 5-10 MG per tablet  Commonly known as:  CADUET  Take 1 tablet by mouth daily.     aspirin 81 MG tablet  Take 81 mg by mouth daily.     colchicine 0.6 MG tablet  Take 0.6 mg by mouth daily. 2 BY MOUTH FIRST SIGN OF GOUT THEN 1 DAILY AS NEEDED     doxycycline 50 MG capsule  Commonly known as:  VIBRAMYCIN  Take 50 mg by mouth daily.     enoxaparin 40 MG/0.4ML injection  Commonly known as:  LOVENOX  Inject 0.4 mLs (40 mg total) into the skin daily.     epoetin alfa 40981 UNIT/ML injection  Commonly known as:  EPOGEN,PROCRIT  20,000 Units every 14 (fourteen) days. AS NEEDED     ergocalciferol 50000 UNITS capsule  Commonly known as:  VITAMIN D2  Take 50,000 Units by mouth every 30 (thirty) days.     esomeprazole 40 MG capsule  Commonly known as:  NEXIUM  Take 40 mg by mouth daily at 12 noon. AM     ferrous sulfate 325 (65 FE) MG tablet  Take 325 mg by mouth daily with breakfast.     fexofenadine 180 MG tablet  Commonly known as:  ALLEGRA  Take 180 mg by mouth daily.     gabapentin 600 MG tablet  Commonly known as:  NEURONTIN  Take 600 mg by mouth 3 (three) times daily.     guaiFENesin-dextromethorphan 100-10 MG/5ML syrup  Commonly known as:  ROBITUSSIN DM  Take 5 mLs by mouth every 4 (four) hours as needed for cough.     levothyroxine 88 MCG tablet  Commonly known as:  SYNTHROID, LEVOTHROID  Take 88 mcg by mouth daily before breakfast.     meloxicam 7.5 MG tablet  Commonly known as:  MOBIC  Take 7.5 mg by mouth daily.     methadone 10 MG tablet  Commonly known as:  DOLOPHINE  Take 10 mg by mouth every 6 (six) hours as needed. 3 TABS AM, 1 TAB NOON, 4 TABS PM     methadone 10 MG tablet  Commonly known as:  DOLOPHINE  Take 4 tablets (40 mg total) by mouth at bedtime.     methadone 10 MG tablet  Commonly known as:  DOLOPHINE  Take 1 tablet (10  mg total) by mouth daily with lunch.     methadone 10 MG  tablet  Commonly known as:  DOLOPHINE  Take 3 tablets (30 mg total) by mouth daily with breakfast.     mometasone 50 MCG/ACT nasal spray  Commonly known as:  NASONEX  Place 2 sprays into the nose daily.     montelukast 10 MG tablet  Commonly known as:  SINGULAIR  Take 10 mg by mouth at bedtime.     OPTIVE 0.5-0.9 % Soln  Generic drug:  Carboxymethylcellul-Glycerin  Apply 1-2 drops to eye 3 (three) times daily as needed.     oxyCODONE 5 MG immediate release tablet  Commonly known as:  Oxy IR/ROXICODONE  Take 1-2 tablets (5-10 mg total) by mouth every 3 (three) hours as needed for breakthrough pain.     polyethylene glycol packet  Commonly known as:  MIRALAX / GLYCOLAX  Take 17 g by mouth daily.     pregabalin 150 MG capsule  Commonly known as:  LYRICA  Take 150 mg by mouth 2 (two) times daily.     pregabalin 150 MG capsule  Commonly known as:  LYRICA  Take 1 capsule (150 mg total) by mouth 2 (two) times daily.     rOPINIRole 1 MG tablet  Commonly known as:  REQUIP  Take 1 mg by mouth 3 (three) times daily.     tiotropium 18 MCG inhalation capsule  Commonly known as:  SPIRIVA  Place 18 mcg into inhaler and inhale daily.     trolamine salicylate 10 % cream  Commonly known as:  ASPERCREME  Apply 1 application topically 3 (three) times daily as needed for muscle pain.        Diagnostic Studies: Dg Knee 1-2 Views Right  07/17/2015   CLINICAL DATA:  74 year old female status post right knee arthroplasty. Initial encounter.  EXAM: RIGHT KNEE - 1-2 VIEW  COMPARISON:  Right knee CT 02/05/2015.  FINDINGS: AP and cross-table lateral views of the right knee demonstrating total knee arthroplasty changes. Hardware appears intact. Surrounding postoperative changes including joint space and subcutaneous gas and overlying anterior skin staples.  IMPRESSION: Sequelae of right knee arthroplasty.   Electronically Signed   By: Odessa Fleming M.D.   On: 07/17/2015 11:51    Disposition: SNF, stable condition        Follow-up Information    Follow up with MENZ,MICHAEL, MD In 2 weeks.   Specialty:  Orthopedic Surgery   Why:  For staple removal and skin check   Contact information:   [address] [place] [address] [phone]        Signed: Amador Cunas [facility] 07/21/2015, 8:07 AM

## 2015-07-21 NOTE — Progress Notes (Signed)
Patient for d/c today to SNF bed at [facility]. Plans confirmed with [facility] rep, SNF and sonRomeo Apple Sanchez/ [phone]. Plan transfer via [facility] arrangements per RN. RN to call report to SNF, Reece Levy, MSW, [employer] [phone]

## 2015-11-12 ENCOUNTER — Other Ambulatory Visit: Payer: Self-pay | Admitting: Family

## 2015-11-12 ENCOUNTER — Ambulatory Visit
Admission: RE | Admit: 2015-11-12 | Discharge: 2015-11-12 | Disposition: A | Discharge status: Home / Self Care | Payer: Medicare (Managed Care) | Source: Ambulatory Visit | Attending: Family | Admitting: Family

## 2015-11-12 DIAGNOSIS — M79672 Pain in left foot: Secondary | ICD-10-CM | POA: Diagnosis present

## 2015-11-12 DIAGNOSIS — M25561 Pain in right knee: Secondary | ICD-10-CM

## 2015-11-12 DIAGNOSIS — Z96651 Presence of right artificial knee joint: Secondary | ICD-10-CM | POA: Insufficient documentation

## 2015-11-12 DIAGNOSIS — M8580 Other specified disorders of bone density and structure, unspecified site: Secondary | ICD-10-CM | POA: Insufficient documentation

## 2015-11-12 DIAGNOSIS — M25572 Pain in left ankle and joints of left foot: Secondary | ICD-10-CM

## 2015-11-12 DIAGNOSIS — Z9889 Other specified postprocedural states: Secondary | ICD-10-CM | POA: Diagnosis not present

## 2015-11-13 ENCOUNTER — Other Ambulatory Visit: Payer: Self-pay | Admitting: Family Medicine

## 2015-11-13 DIAGNOSIS — M25561 Pain in right knee: Secondary | ICD-10-CM

## 2015-12-29 ENCOUNTER — Ambulatory Visit
Admission: RE | Admit: 2015-12-29 | Discharge: 2015-12-29 | Disposition: A | Discharge status: Home / Self Care | Payer: Medicare (Managed Care) | Source: Ambulatory Visit | Attending: Family | Admitting: Family

## 2015-12-29 ENCOUNTER — Other Ambulatory Visit: Payer: Self-pay | Admitting: Family

## 2015-12-29 DIAGNOSIS — R05 Cough: Secondary | ICD-10-CM

## 2015-12-29 DIAGNOSIS — R059 Cough, unspecified: Secondary | ICD-10-CM

## 2015-12-29 DIAGNOSIS — R918 Other nonspecific abnormal finding of lung field: Secondary | ICD-10-CM | POA: Insufficient documentation

## 2016-11-17 ENCOUNTER — Encounter: Payer: Self-pay | Admitting: Emergency Medicine

## 2016-11-17 ENCOUNTER — Emergency Department: Payer: Medicare HMO

## 2016-11-17 ENCOUNTER — Emergency Department
Admission: EM | Admit: 2016-11-17 | Discharge: 2016-11-17 | Disposition: A | Discharge status: Home / Self Care | Payer: Medicare HMO | Attending: Emergency Medicine | Admitting: Emergency Medicine

## 2016-11-17 DIAGNOSIS — Z7982 Long term (current) use of aspirin: Secondary | ICD-10-CM | POA: Insufficient documentation

## 2016-11-17 DIAGNOSIS — Z7901 Long term (current) use of anticoagulants: Secondary | ICD-10-CM | POA: Diagnosis not present

## 2016-11-17 DIAGNOSIS — N189 Chronic kidney disease, unspecified: Secondary | ICD-10-CM | POA: Insufficient documentation

## 2016-11-17 DIAGNOSIS — R509 Fever, unspecified: Secondary | ICD-10-CM | POA: Diagnosis not present

## 2016-11-17 DIAGNOSIS — I130 Hypertensive heart and chronic kidney disease with heart failure and stage 1 through stage 4 chronic kidney disease, or unspecified chronic kidney disease: Secondary | ICD-10-CM | POA: Insufficient documentation

## 2016-11-17 DIAGNOSIS — Z87891 Personal history of nicotine dependence: Secondary | ICD-10-CM | POA: Insufficient documentation

## 2016-11-17 DIAGNOSIS — R600 Localized edema: Secondary | ICD-10-CM | POA: Diagnosis not present

## 2016-11-17 DIAGNOSIS — R52 Pain, unspecified: Secondary | ICD-10-CM

## 2016-11-17 DIAGNOSIS — Z791 Long term (current) use of non-steroidal anti-inflammatories (NSAID): Secondary | ICD-10-CM | POA: Diagnosis not present

## 2016-11-17 DIAGNOSIS — M7989 Other specified soft tissue disorders: Secondary | ICD-10-CM | POA: Diagnosis present

## 2016-11-17 DIAGNOSIS — I509 Heart failure, unspecified: Secondary | ICD-10-CM | POA: Diagnosis not present

## 2016-11-17 DIAGNOSIS — Z79899 Other long term (current) drug therapy: Secondary | ICD-10-CM | POA: Insufficient documentation

## 2016-11-17 DIAGNOSIS — E039 Hypothyroidism, unspecified: Secondary | ICD-10-CM | POA: Insufficient documentation

## 2016-11-17 DIAGNOSIS — R609 Edema, unspecified: Secondary | ICD-10-CM

## 2016-11-17 LAB — CBC WITH DIFFERENTIAL/PLATELET
Basophils Absolute: 0.1 10*3/uL (ref 0–0.1)
Basophils Relative: 1 %
EOS ABS: 0 10*3/uL (ref 0–0.7)
Eosinophils Relative: 1 %
HCT: 31.4 % — ABNORMAL LOW (ref 35.0–47.0)
Hemoglobin: 10.6 g/dL — ABNORMAL LOW (ref 12.0–16.0)
LYMPHS ABS: 1.6 10*3/uL (ref 1.0–3.6)
Lymphocytes Relative: 16 %
MCH: 30 pg (ref 26.0–34.0)
MCHC: 33.8 g/dL (ref 32.0–36.0)
MCV: 88.7 fL (ref 80.0–100.0)
MONO ABS: 1.2 10*3/uL — AB (ref 0.2–0.9)
MONOS PCT: 12 %
Neutro Abs: 7.1 10*3/uL — ABNORMAL HIGH (ref 1.4–6.5)
Neutrophils Relative %: 70 %
Platelets: 347 10*3/uL (ref 150–440)
RBC: 3.54 MIL/uL — ABNORMAL LOW (ref 3.80–5.20)
RDW: 14.9 % — ABNORMAL HIGH (ref 11.5–14.5)
WBC: 10.1 10*3/uL (ref 3.6–11.0)

## 2016-11-17 LAB — COMPREHENSIVE METABOLIC PANEL
ALT: 10 U/L — ABNORMAL LOW (ref 14–54)
ANION GAP: 8 (ref 5–15)
AST: 18 U/L (ref 15–41)
Albumin: 4 g/dL (ref 3.5–5.0)
Alkaline Phosphatase: 102 U/L (ref 38–126)
BUN: 31 mg/dL — ABNORMAL HIGH (ref 6–20)
CALCIUM: 9.6 mg/dL (ref 8.9–10.3)
CHLORIDE: 105 mmol/L (ref 101–111)
CO2: 25 mmol/L (ref 22–32)
Creatinine, Ser: 1.72 mg/dL — ABNORMAL HIGH (ref 0.44–1.00)
GFR calc non Af Amer: 28 mL/min — ABNORMAL LOW (ref >60)
GFR, EST AFRICAN AMERICAN: 32 mL/min — AB (ref >60)
Glucose, Bld: 124 mg/dL — ABNORMAL HIGH (ref 65–99)
Potassium: 3.9 mmol/L (ref 3.5–5.1)
SODIUM: 138 mmol/L (ref 135–145)
Total Bilirubin: 1.2 mg/dL (ref 0.3–1.2)
Total Protein: 8.7 g/dL — ABNORMAL HIGH (ref 6.5–8.1)

## 2016-11-17 LAB — URIC ACID: Uric Acid, Serum: 5.5 mg/dL (ref 2.3–6.6)

## 2016-11-17 LAB — SEDIMENTATION RATE: Sed Rate: 82 mm/hr — ABNORMAL HIGH (ref 0–30)

## 2016-11-17 MED ORDER — LIDOCAINE HCL (PF) 1 % IJ SOLN
5.0000 mL | Freq: Once | INTRAMUSCULAR | Status: AC
  Administered 2016-11-17: 5 mL
  Filled 2016-11-17: qty 5

## 2016-11-17 MED ORDER — TRAMADOL HCL 50 MG PO TABS
50.0000 mg | ORAL_TABLET | Freq: Once | ORAL | Status: AC
  Administered 2016-11-17: 50 mg via ORAL
  Filled 2016-11-17: qty 1

## 2016-11-17 MED ORDER — OXYCODONE-ACETAMINOPHEN 5-325 MG PO TABS: 2.0000 | ORAL_TABLET | Freq: Four times a day (QID) | ORAL | 0 refills | Status: DC | PRN

## 2016-11-17 NOTE — ED Provider Notes (Signed)
Patient was discussed with Dr. Joice LoftsPoggi from orthopedics. Clinical picture is unclear at this time. She'll be placed in a posterior splint with crutches and given pain medicine. She will follow up in 2 days for recheck in the office.   Chloe FilbertJonathan E Dariusz Brase, MD 11/17/16 [phone]

## 2016-11-17 NOTE — ED Triage Notes (Signed)
Pt to ed with c/o left ankle swelling that started this am.  Pt also reports increased pain with weight bearing on that leg.  Pt denies injury.
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
## 2016-11-17 NOTE — ED Notes (Signed)
PA Katrinka BlazingSmith advised it was OK to change the crutches to a walker due to the patient not being able to use the crutches.

## 2016-12-08 ENCOUNTER — Emergency Department
Admission: EM | Admit: 2016-12-08 | Discharge: 2016-12-08 | Disposition: A | Discharge status: Home / Self Care | Payer: Medicare HMO | Attending: Emergency Medicine | Admitting: Emergency Medicine

## 2016-12-08 ENCOUNTER — Emergency Department: Payer: Medicare HMO

## 2016-12-08 ENCOUNTER — Encounter: Payer: Self-pay | Admitting: *Deleted

## 2016-12-08 DIAGNOSIS — Z87891 Personal history of nicotine dependence: Secondary | ICD-10-CM | POA: Insufficient documentation

## 2016-12-08 DIAGNOSIS — Y92009 Unspecified place in unspecified non-institutional (private) residence as the place of occurrence of the external cause: Secondary | ICD-10-CM

## 2016-12-08 DIAGNOSIS — W1809XA Striking against other object with subsequent fall, initial encounter: Secondary | ICD-10-CM | POA: Insufficient documentation

## 2016-12-08 DIAGNOSIS — Z7982 Long term (current) use of aspirin: Secondary | ICD-10-CM | POA: Diagnosis not present

## 2016-12-08 DIAGNOSIS — E1122 Type 2 diabetes mellitus with diabetic chronic kidney disease: Secondary | ICD-10-CM | POA: Diagnosis not present

## 2016-12-08 DIAGNOSIS — E039 Hypothyroidism, unspecified: Secondary | ICD-10-CM | POA: Diagnosis not present

## 2016-12-08 DIAGNOSIS — I509 Heart failure, unspecified: Secondary | ICD-10-CM | POA: Insufficient documentation

## 2016-12-08 DIAGNOSIS — Z79899 Other long term (current) drug therapy: Secondary | ICD-10-CM | POA: Diagnosis not present

## 2016-12-08 DIAGNOSIS — Y999 Unspecified external cause status: Secondary | ICD-10-CM | POA: Insufficient documentation

## 2016-12-08 DIAGNOSIS — N189 Chronic kidney disease, unspecified: Secondary | ICD-10-CM | POA: Diagnosis not present

## 2016-12-08 DIAGNOSIS — S0093XA Contusion of unspecified part of head, initial encounter: Secondary | ICD-10-CM | POA: Insufficient documentation

## 2016-12-08 DIAGNOSIS — I130 Hypertensive heart and chronic kidney disease with heart failure and stage 1 through stage 4 chronic kidney disease, or unspecified chronic kidney disease: Secondary | ICD-10-CM | POA: Diagnosis not present

## 2016-12-08 DIAGNOSIS — Y92003 Bedroom of unspecified non-institutional (private) residence as the place of occurrence of the external cause: Secondary | ICD-10-CM | POA: Diagnosis not present

## 2016-12-08 DIAGNOSIS — W19XXXA Unspecified fall, initial encounter: Secondary | ICD-10-CM

## 2016-12-08 DIAGNOSIS — Y939 Activity, unspecified: Secondary | ICD-10-CM | POA: Diagnosis not present

## 2016-12-08 DIAGNOSIS — T148XXA Other injury of unspecified body region, initial encounter: Secondary | ICD-10-CM

## 2016-12-08 DIAGNOSIS — S0990XA Unspecified injury of head, initial encounter: Secondary | ICD-10-CM | POA: Diagnosis present

## 2016-12-08 NOTE — Discharge Instructions (Signed)
Please seek medical attention for any high fevers, chest pain, shortness of breath, change in behavior, persistent vomiting, bloody stool or any other new or concerning symptoms.  

## 2016-12-08 NOTE — ED Triage Notes (Signed)
PT BROUGHT IN via ems from home with a fall.  Pt fell in the bedroom striking the bed.  Hematoma to back of head.  No loc  No vomiting.  Pt alert.

## 2016-12-08 NOTE — ED Notes (Signed)
Patient transported to CT 

## 2016-12-08 NOTE — ED Notes (Signed)
ED Provider at bedside. 

## 2016-12-08 NOTE — ED Notes (Signed)
Pt reports getting tripped up on the bedpost and falling.  Pt struck the bed.  Pt has hematoma to back of head.  No loc  No vomiting.  Pt ambulates with a walker.  Pt takes asa.   No chest pain or sob.  Pt  Alert   Speech clear.

## 2016-12-08 NOTE — ED Notes (Signed)
Pt alert.  D/c inst to pt and friend.

## 2016-12-08 NOTE — ED Provider Notes (Signed)
[facility] Emergency Department Provider Note   ____________________________________________   I have reviewed the triage vital signs and the nursing notes.   HISTORY  Chief Complaint Fall and Head Injury   History limited by: Not Limited   HPI Chloe Sanchez is a 76 y.o. female who presents to the emergency department today because of concerns for head injury after a fall. The patient states that she caught her foot on her bed and fell backwards. She fell backwards onto a concrete floor. She tried to catch results but was unsuccessful. She had the back of her head. She denies any loss of consciousness. Denies any nausea or vomiting. Denies any blurred vision. Denies being on any blood thinners other than aspirin.   Past Medical History:  Diagnosis Date  . Anemia   . Anxiety   . Arthritis   . CHF (congestive heart failure) (HCC)   . Chronic kidney disease    CKD  . Depression   . Diabetes mellitus without complication (HCC)   . Gout   . Hypertension   . Hypothyroidism   . On supplemental oxygen therapy    AT NIGHT  . Polyneuropathy (HCC)   . RLS (restless legs syndrome)   . Sjogren's syndrome (HCC)   . Sleep apnea     Patient Active Problem List   Diagnosis Date Noted  . Primary osteoarthritis of knee 07/17/2015    Past Surgical History:  Procedure Laterality Date  . BACK SURGERY     lumbar  . CHOLECYSTECTOMY    . FOOT SURGERY Bilateral   . TOTAL KNEE ARTHROPLASTY Right 07/17/2015   Procedure: TOTAL KNEE ARTHROPLASTY;  Surgeon: Kennedy Bucker, MD;  Location: [facility] ORS;  Service: Orthopedics;  Laterality: Right;    Prior to Admission medications   Medication Sig Start Date End Date Taking? Authorizing Provider  albuterol (PROVENTIL HFA;VENTOLIN HFA) 108 (90 BASE) MCG/ACT inhaler Inhale 2 puffs into the lungs every 4 (four) hours as needed for wheezing or shortness of breath.    Historical Provider, MD  allopurinol (ZYLOPRIM) 100 MG tablet  Take 100 mg by mouth daily.    Historical Provider, MD  amitriptyline (ELAVIL) 50 MG tablet Take 50 mg by mouth at bedtime.    Historical Provider, MD  amLODipine-atorvastatin (CADUET) 5-10 MG per tablet Take 1 tablet by mouth daily.    Historical Provider, MD  aspirin 81 MG tablet Take 81 mg by mouth daily.    Historical Provider, MD  Carboxymethylcellul-Glycerin (OPTIVE) 0.5-0.9 % SOLN Apply 1-2 drops to eye 3 (three) times daily as needed.    Historical Provider, MD  colchicine 0.6 MG tablet Take 0.6 mg by mouth daily. 2 BY MOUTH FIRST SIGN OF GOUT THEN 1 DAILY AS NEEDED    Historical Provider, MD  doxycycline (VIBRAMYCIN) 50 MG capsule Take 50 mg by mouth daily.    Historical Provider, MD  enoxaparin (LOVENOX) 40 MG/0.4ML injection Inject 0.4 mLs (40 mg total) into the skin daily. 07/21/15   Evon Slack, PA-C  epoetin alfa (EPOGEN,PROCRIT) 11914 UNIT/ML injection 20,000 Units every 14 (fourteen) days. AS NEEDED    Historical Provider, MD  ergocalciferol (VITAMIN D2) 50000 UNITS capsule Take 50,000 Units by mouth every 30 (thirty) days.    Historical Provider, MD  esomeprazole (NEXIUM) 40 MG capsule Take 40 mg by mouth daily at 12 noon. AM    Historical Provider, MD  ferrous sulfate 325 (65 FE) MG tablet Take 325 mg by mouth daily with breakfast.  Historical Provider, MD  fexofenadine (ALLEGRA) 180 MG tablet Take 180 mg by mouth daily.    Historical Provider, MD  gabapentin (NEURONTIN) 600 MG tablet Take 600 mg by mouth 3 (three) times daily.    Historical Provider, MD  guaiFENesin-dextromethorphan (ROBITUSSIN DM) 100-10 MG/5ML syrup Take 5 mLs by mouth every 4 (four) hours as needed for cough.    Historical Provider, MD  levothyroxine (SYNTHROID, LEVOTHROID) 88 MCG tablet Take 88 mcg by mouth daily before breakfast.    Historical Provider, MD  meloxicam (MOBIC) 7.5 MG tablet Take 7.5 mg by mouth daily.    Historical Provider, MD  methadone (DOLOPHINE) 10 MG tablet Take 10 mg by mouth every  6 (six) hours as needed. 3 TABS AM, 1 TAB NOON, 4 TABS PM    Historical Provider, MD  methadone (DOLOPHINE) 10 MG tablet Take 4 tablets (40 mg total) by mouth at bedtime. 07/21/15   Evon Slack, PA-C  methadone (DOLOPHINE) 10 MG tablet Take 1 tablet (10 mg total) by mouth daily with lunch. 07/21/15   Evon Slack, PA-C  methadone (DOLOPHINE) 10 MG tablet Take 3 tablets (30 mg total) by mouth daily with breakfast. 07/21/15   Evon Slack, PA-C  mometasone (NASONEX) 50 MCG/ACT nasal spray Place 2 sprays into the nose daily.    Historical Provider, MD  montelukast (SINGULAIR) 10 MG tablet Take 10 mg by mouth at bedtime.    Historical Provider, MD  oxyCODONE (OXY IR/ROXICODONE) 5 MG immediate release tablet Take 1-2 tablets (5-10 mg total) by mouth every 3 (three) hours as needed for breakthrough pain. 07/21/15   Evon Slack, PA-C  oxyCODONE-acetaminophen (PERCOCET) 5-325 MG tablet Take 2 tablets by mouth every 6 (six) hours as needed for moderate pain or severe pain. 11/17/16   Emily Filbert, MD  polyethylene glycol [facility] Hal) packet Take 17 g by mouth daily.    Historical Provider, MD  pregabalin (LYRICA) 150 MG capsule Take 150 mg by mouth 2 (two) times daily.    Historical Provider, MD  pregabalin (LYRICA) 150 MG capsule Take 1 capsule (150 mg total) by mouth 2 (two) times daily. 07/21/15   Evon Slack, PA-C  rOPINIRole (REQUIP) 1 MG tablet Take 1 mg by mouth 3 (three) times daily.    Historical Provider, MD  tiotropium (SPIRIVA) 18 MCG inhalation capsule Place 18 mcg into inhaler and inhale daily.    Historical Provider, MD  trolamine salicylate (ASPERCREME) 10 % cream Apply 1 application topically 3 (three) times daily as needed for muscle pain.    Historical Provider, MD    Allergies Patient has no known allergies.  No family history on file.  Social History Social History  Substance Use Topics  . Smoking status: Never Smoker  . Smokeless tobacco: Former Neurosurgeon     Types: Snuff    Quit date: 07/16/2015  . Alcohol use No    Review of Systems  Constitutional: Negative for fever. Cardiovascular: Negative for chest pain. Respiratory: Negative for shortness of breath. Gastrointestinal: Negative for abdominal pain, vomiting and diarrhea. Genitourinary: Negative for dysuria. Musculoskeletal: Negative for back pain. Positive for neck pain. Skin: Negative for rash. Neurological: Positive for some posterior head pain.  10-point ROS otherwise negative.  ____________________________________________   PHYSICAL EXAM:  VITAL SIGNS: ED Triage Vitals  Enc Vitals Group     BP 12/08/16 2117 140/70     Pulse Rate 12/08/16 2117 65     Resp 12/08/16 2117 20  Temp 12/08/16 2117 97.9 F (36.6 C)     Temp Source 12/08/16 2117 Oral     SpO2 12/08/16 2117 98 %     Weight 12/08/16 2117 140 lb (63.5 kg)     Height 12/08/16 2117 5\' 3"  (1.6 m)     Head Circumference --      Peak Flow --      Pain Score 12/08/16 2118 5     Pain Loc --      Pain Edu? --      Excl. in GC? --      Constitutional: Alert and oriented. Well appearing and in no distress. Eyes: Conjunctivae are normal. Normal extraocular movements. ENT   Head: Normocephalic and atraumatic.   Nose: No congestion/rhinnorhea.   Mouth/Throat: Mucous membranes are moist.   Neck: No stridor. No midline tenderness.  Hematological/Lymphatic/Immunilogical: No cervical lymphadenopathy. Cardiovascular: Normal rate, regular rhythm.  No murmurs, rubs, or gallops.  Respiratory: Normal respiratory effort without tachypnea nor retractions. Breath sounds are clear and equal bilaterally. No wheezes/rales/rhonchi. Gastrointestinal: Soft and non tender. No rebound. No guarding.  Genitourinary: Deferred Musculoskeletal: Normal range of motion in all extremities. No lower extremity edema. Neurologic:  Normal speech and language. No gross focal neurologic deficits are appreciated.  Skin:  Skin is  warm, dry and intact. No rash noted. Psychiatric: Mood and affect are normal. Speech and behavior are normal. Patient exhibits appropriate insight and judgment.  ____________________________________________    LABS (pertinent positives/negatives)  None  ____________________________________________   EKG  None  ____________________________________________    RADIOLOGY  CT head/cervical spine IMPRESSION:  1. No acute intracranial findings. There is mild chronic appearing  white matter hypodensities which likely represent small vessel  ischemic disease.  2. Negative for acute cervical spine fracture      ____________________________________________   PROCEDURES  Procedures  ____________________________________________   INITIAL IMPRESSION / ASSESSMENT AND PLAN / ED COURSE  Pertinent labs & imaging results that were available during my care of the patient were reviewed by me and considered in my medical decision making (see chart for details).  Patient presented to the emergency department today because of concerns for head injury after a fall. Had CT and neck CT negative for acute injury. This was a mechanical fall. Patient states otherwise she has been feeling healthy recently. At this point I do not feel any blood work is necessary.  ____________________________________________   FINAL CLINICAL IMPRESSION(S) / ED DIAGNOSES  Final diagnoses:  Fall in home, initial encounter  Hematoma     Note: This dictation was prepared with Dragon dictation. Any transcriptional errors that result from this process are unintentional     Phineas SemenGraydon Malva Diesing, MD 12/08/16 2230

## 2016-12-16 ENCOUNTER — Emergency Department: Payer: Medicare HMO

## 2016-12-16 ENCOUNTER — Inpatient Hospital Stay
Admission: EM | Admit: 2016-12-16 | Discharge: 2017-01-06 | DRG: 871 | Disposition: E | Payer: Medicare HMO | Attending: Pulmonary Disease | Admitting: Pulmonary Disease

## 2016-12-16 DIAGNOSIS — E1142 Type 2 diabetes mellitus with diabetic polyneuropathy: Secondary | ICD-10-CM | POA: Diagnosis present

## 2016-12-16 DIAGNOSIS — N183 Chronic kidney disease, stage 3 unspecified: Secondary | ICD-10-CM

## 2016-12-16 DIAGNOSIS — Z7951 Long term (current) use of inhaled steroids: Secondary | ICD-10-CM

## 2016-12-16 DIAGNOSIS — R0600 Dyspnea, unspecified: Secondary | ICD-10-CM | POA: Diagnosis not present

## 2016-12-16 DIAGNOSIS — A419 Sepsis, unspecified organism: Secondary | ICD-10-CM | POA: Diagnosis present

## 2016-12-16 DIAGNOSIS — R451 Restlessness and agitation: Secondary | ICD-10-CM | POA: Diagnosis not present

## 2016-12-16 DIAGNOSIS — R7989 Other specified abnormal findings of blood chemistry: Secondary | ICD-10-CM | POA: Diagnosis not present

## 2016-12-16 DIAGNOSIS — M3500 Sicca syndrome, unspecified: Secondary | ICD-10-CM | POA: Diagnosis present

## 2016-12-16 DIAGNOSIS — I471 Supraventricular tachycardia: Secondary | ICD-10-CM

## 2016-12-16 DIAGNOSIS — J810 Acute pulmonary edema: Secondary | ICD-10-CM | POA: Diagnosis not present

## 2016-12-16 DIAGNOSIS — R251 Tremor, unspecified: Secondary | ICD-10-CM

## 2016-12-16 DIAGNOSIS — J90 Pleural effusion, not elsewhere classified: Secondary | ICD-10-CM

## 2016-12-16 DIAGNOSIS — J690 Pneumonitis due to inhalation of food and vomit: Secondary | ICD-10-CM | POA: Diagnosis present

## 2016-12-16 DIAGNOSIS — N179 Acute kidney failure, unspecified: Secondary | ICD-10-CM | POA: Diagnosis not present

## 2016-12-16 DIAGNOSIS — I959 Hypotension, unspecified: Secondary | ICD-10-CM | POA: Diagnosis not present

## 2016-12-16 DIAGNOSIS — E876 Hypokalemia: Secondary | ICD-10-CM | POA: Diagnosis present

## 2016-12-16 DIAGNOSIS — N189 Chronic kidney disease, unspecified: Secondary | ICD-10-CM

## 2016-12-16 DIAGNOSIS — G2581 Restless legs syndrome: Secondary | ICD-10-CM | POA: Diagnosis present

## 2016-12-16 DIAGNOSIS — G9341 Metabolic encephalopathy: Secondary | ICD-10-CM | POA: Diagnosis present

## 2016-12-16 DIAGNOSIS — Z791 Long term (current) use of non-steroidal anti-inflammatories (NSAID): Secondary | ICD-10-CM

## 2016-12-16 DIAGNOSIS — R05 Cough: Secondary | ICD-10-CM | POA: Diagnosis not present

## 2016-12-16 DIAGNOSIS — R0603 Acute respiratory distress: Secondary | ICD-10-CM

## 2016-12-16 DIAGNOSIS — Z515 Encounter for palliative care: Secondary | ICD-10-CM

## 2016-12-16 DIAGNOSIS — E039 Hypothyroidism, unspecified: Secondary | ICD-10-CM | POA: Diagnosis present

## 2016-12-16 DIAGNOSIS — E870 Hyperosmolality and hypernatremia: Secondary | ICD-10-CM | POA: Diagnosis present

## 2016-12-16 DIAGNOSIS — F419 Anxiety disorder, unspecified: Secondary | ICD-10-CM | POA: Diagnosis present

## 2016-12-16 DIAGNOSIS — R2990 Unspecified symptoms and signs involving the nervous system: Secondary | ICD-10-CM

## 2016-12-16 DIAGNOSIS — J9601 Acute respiratory failure with hypoxia: Secondary | ICD-10-CM | POA: Diagnosis present

## 2016-12-16 DIAGNOSIS — Z4659 Encounter for fitting and adjustment of other gastrointestinal appliance and device: Secondary | ICD-10-CM

## 2016-12-16 DIAGNOSIS — R531 Weakness: Secondary | ICD-10-CM | POA: Diagnosis not present

## 2016-12-16 DIAGNOSIS — N261 Atrophy of kidney (terminal): Secondary | ICD-10-CM | POA: Diagnosis present

## 2016-12-16 DIAGNOSIS — M109 Gout, unspecified: Secondary | ICD-10-CM | POA: Diagnosis present

## 2016-12-16 DIAGNOSIS — E1122 Type 2 diabetes mellitus with diabetic chronic kidney disease: Secondary | ICD-10-CM | POA: Diagnosis present

## 2016-12-16 DIAGNOSIS — R6521 Severe sepsis with septic shock: Secondary | ICD-10-CM | POA: Diagnosis present

## 2016-12-16 DIAGNOSIS — I442 Atrioventricular block, complete: Secondary | ICD-10-CM

## 2016-12-16 DIAGNOSIS — I130 Hypertensive heart and chronic kidney disease with heart failure and stage 1 through stage 4 chronic kidney disease, or unspecified chronic kidney disease: Secondary | ICD-10-CM | POA: Diagnosis present

## 2016-12-16 DIAGNOSIS — R570 Cardiogenic shock: Secondary | ICD-10-CM | POA: Diagnosis present

## 2016-12-16 DIAGNOSIS — I248 Other forms of acute ischemic heart disease: Secondary | ICD-10-CM | POA: Diagnosis present

## 2016-12-16 DIAGNOSIS — Z79891 Long term (current) use of opiate analgesic: Secondary | ICD-10-CM

## 2016-12-16 DIAGNOSIS — D631 Anemia in chronic kidney disease: Secondary | ICD-10-CM

## 2016-12-16 DIAGNOSIS — G40909 Epilepsy, unspecified, not intractable, without status epilepticus: Secondary | ICD-10-CM | POA: Diagnosis present

## 2016-12-16 DIAGNOSIS — I5033 Acute on chronic diastolic (congestive) heart failure: Secondary | ICD-10-CM | POA: Diagnosis present

## 2016-12-16 DIAGNOSIS — R0789 Other chest pain: Secondary | ICD-10-CM

## 2016-12-16 DIAGNOSIS — J189 Pneumonia, unspecified organism: Secondary | ICD-10-CM | POA: Diagnosis present

## 2016-12-16 DIAGNOSIS — I4719 Other supraventricular tachycardia: Secondary | ICD-10-CM

## 2016-12-16 DIAGNOSIS — N170 Acute kidney failure with tubular necrosis: Secondary | ICD-10-CM | POA: Diagnosis present

## 2016-12-16 DIAGNOSIS — J449 Chronic obstructive pulmonary disease, unspecified: Secondary | ICD-10-CM | POA: Diagnosis not present

## 2016-12-16 DIAGNOSIS — G4733 Obstructive sleep apnea (adult) (pediatric): Secondary | ICD-10-CM | POA: Diagnosis present

## 2016-12-16 DIAGNOSIS — N184 Chronic kidney disease, stage 4 (severe): Secondary | ICD-10-CM | POA: Diagnosis present

## 2016-12-16 DIAGNOSIS — Z96651 Presence of right artificial knee joint: Secondary | ICD-10-CM | POA: Diagnosis present

## 2016-12-16 DIAGNOSIS — Z79899 Other long term (current) drug therapy: Secondary | ICD-10-CM

## 2016-12-16 DIAGNOSIS — Z7189 Other specified counseling: Secondary | ICD-10-CM | POA: Diagnosis not present

## 2016-12-16 DIAGNOSIS — R001 Bradycardia, unspecified: Secondary | ICD-10-CM | POA: Diagnosis not present

## 2016-12-16 DIAGNOSIS — E785 Hyperlipidemia, unspecified: Secondary | ICD-10-CM | POA: Diagnosis present

## 2016-12-16 DIAGNOSIS — J441 Chronic obstructive pulmonary disease with (acute) exacerbation: Secondary | ICD-10-CM

## 2016-12-16 DIAGNOSIS — Z7982 Long term (current) use of aspirin: Secondary | ICD-10-CM

## 2016-12-16 DIAGNOSIS — J181 Lobar pneumonia, unspecified organism: Secondary | ICD-10-CM

## 2016-12-16 DIAGNOSIS — Z66 Do not resuscitate: Secondary | ICD-10-CM | POA: Diagnosis present

## 2016-12-16 DIAGNOSIS — I272 Pulmonary hypertension, unspecified: Secondary | ICD-10-CM | POA: Diagnosis present

## 2016-12-16 DIAGNOSIS — Z01818 Encounter for other preprocedural examination: Secondary | ICD-10-CM

## 2016-12-16 DIAGNOSIS — J9690 Respiratory failure, unspecified, unspecified whether with hypoxia or hypercapnia: Secondary | ICD-10-CM

## 2016-12-16 DIAGNOSIS — R918 Other nonspecific abnormal finding of lung field: Secondary | ICD-10-CM | POA: Diagnosis not present

## 2016-12-16 DIAGNOSIS — E1165 Type 2 diabetes mellitus with hyperglycemia: Secondary | ICD-10-CM | POA: Diagnosis present

## 2016-12-16 DIAGNOSIS — R569 Unspecified convulsions: Secondary | ICD-10-CM

## 2016-12-16 DIAGNOSIS — R059 Cough, unspecified: Secondary | ICD-10-CM

## 2016-12-16 DIAGNOSIS — E1151 Type 2 diabetes mellitus with diabetic peripheral angiopathy without gangrene: Secondary | ICD-10-CM

## 2016-12-16 DIAGNOSIS — I340 Nonrheumatic mitral (valve) insufficiency: Secondary | ICD-10-CM | POA: Diagnosis present

## 2016-12-16 DIAGNOSIS — Z87891 Personal history of nicotine dependence: Secondary | ICD-10-CM

## 2016-12-16 DIAGNOSIS — I350 Nonrheumatic aortic (valve) stenosis: Secondary | ICD-10-CM | POA: Diagnosis present

## 2016-12-16 DIAGNOSIS — Z452 Encounter for adjustment and management of vascular access device: Secondary | ICD-10-CM

## 2016-12-16 DIAGNOSIS — Z978 Presence of other specified devices: Secondary | ICD-10-CM

## 2016-12-16 LAB — BASIC METABOLIC PANEL
Anion gap: 10 (ref 5–15)
BUN: 30 mg/dL — ABNORMAL HIGH (ref 6–20)
CALCIUM: 9.1 mg/dL (ref 8.9–10.3)
CO2: 22 mmol/L (ref 22–32)
CREATININE: 1.77 mg/dL — AB (ref 0.44–1.00)
Chloride: 108 mmol/L (ref 101–111)
GFR calc Af Amer: 31 mL/min — ABNORMAL LOW (ref >60)
GFR calc non Af Amer: 27 mL/min — ABNORMAL LOW (ref >60)
GLUCOSE: 128 mg/dL — AB (ref 65–99)
Potassium: 4.2 mmol/L (ref 3.5–5.1)
Sodium: 140 mmol/L (ref 135–145)

## 2016-12-16 LAB — CBC
HCT: 26.8 % — ABNORMAL LOW (ref 35.0–47.0)
Hemoglobin: 9.1 g/dL — ABNORMAL LOW (ref 12.0–16.0)
MCH: 30.5 pg (ref 26.0–34.0)
MCHC: 33.9 g/dL (ref 32.0–36.0)
MCV: 89.8 fL (ref 80.0–100.0)
PLATELETS: 295 10*3/uL (ref 150–440)
RBC: 2.99 MIL/uL — ABNORMAL LOW (ref 3.80–5.20)
RDW: 15 % — AB (ref 11.5–14.5)
WBC: 8.6 10*3/uL (ref 3.6–11.0)

## 2016-12-16 LAB — INFLUENZA PANEL BY PCR (TYPE A & B)
INFLBPCR: NEGATIVE
Influenza A By PCR: NEGATIVE

## 2016-12-16 LAB — GLUCOSE, CAPILLARY: Glucose-Capillary: 134 mg/dL — ABNORMAL HIGH (ref 65–99)

## 2016-12-16 LAB — TROPONIN I

## 2016-12-16 MED ORDER — HEPARIN SODIUM (PORCINE) 5000 UNIT/ML IJ SOLN
5000.0000 [IU] | Freq: Three times a day (TID) | INTRAMUSCULAR | Status: DC
  Administered 2016-12-16 – 2016-12-28 (×34): 5000 [IU] via SUBCUTANEOUS
  Filled 2016-12-16 (×34): qty 1

## 2016-12-16 MED ORDER — CEFTRIAXONE SODIUM-DEXTROSE 1-3.74 GM-% IV SOLR: 1.0000 g | Freq: Once | INTRAVENOUS | Status: AC

## 2016-12-16 MED ORDER — AMLODIPINE BESYLATE 5 MG PO TABS
5.0000 mg | ORAL_TABLET | Freq: Every day | ORAL | Status: DC
  Administered 2016-12-17 – 2016-12-18 (×2): 5 mg via ORAL
  Filled 2016-12-16 (×2): qty 1

## 2016-12-16 MED ORDER — ALLOPURINOL 100 MG PO TABS
100.0000 mg | ORAL_TABLET | Freq: Every day | ORAL | Status: DC
  Administered 2016-12-17 – 2016-12-19 (×3): 100 mg via ORAL
  Filled 2016-12-16 (×3): qty 1

## 2016-12-16 MED ORDER — LEVOTHYROXINE SODIUM 88 MCG PO TABS
88.0000 ug | ORAL_TABLET | Freq: Every day | ORAL | Status: DC
  Administered 2016-12-17 – 2016-12-19 (×3): 88 ug via ORAL
  Filled 2016-12-16 (×3): qty 1

## 2016-12-16 MED ORDER — CEFTRIAXONE SODIUM-DEXTROSE 1-3.74 GM-% IV SOLR
1.0000 g | INTRAVENOUS | Status: DC
  Administered 2016-12-17: 1 g via INTRAVENOUS
  Filled 2016-12-16: qty 50

## 2016-12-16 MED ORDER — POLYETHYLENE GLYCOL 3350 17 G PO PACK: 17.0000 g | PACK | Freq: Every day | ORAL | Status: DC | PRN

## 2016-12-16 MED ORDER — IPRATROPIUM-ALBUTEROL 0.5-2.5 (3) MG/3ML IN SOLN
3.0000 mL | Freq: Four times a day (QID) | RESPIRATORY_TRACT | Status: DC
  Administered 2016-12-16 – 2016-12-17 (×4): 3 mL via RESPIRATORY_TRACT
  Filled 2016-12-16 (×4): qty 3

## 2016-12-16 MED ORDER — DEXTROSE 5 % IV SOLN
250.0000 mg | INTRAVENOUS | Status: DC
  Administered 2016-12-16: 250 mg via INTRAVENOUS
  Filled 2016-12-16 (×2): qty 250

## 2016-12-16 MED ORDER — FERROUS SULFATE 325 (65 FE) MG PO TABS
325.0000 mg | ORAL_TABLET | Freq: Every day | ORAL | Status: DC
  Administered 2016-12-17 – 2016-12-19 (×3): 325 mg via ORAL
  Filled 2016-12-16 (×3): qty 1

## 2016-12-16 MED ORDER — INSULIN ASPART 100 UNIT/ML ~~LOC~~ SOLN
0.0000 [IU] | Freq: Three times a day (TID) | SUBCUTANEOUS | Status: DC
  Administered 2016-12-17: 13:00:00 2 [IU] via SUBCUTANEOUS
  Administered 2016-12-18: 1 [IU] via SUBCUTANEOUS
  Administered 2016-12-19: 2 [IU] via SUBCUTANEOUS
  Filled 2016-12-16: qty 1
  Filled 2016-12-16: qty 2

## 2016-12-16 MED ORDER — AMLODIPINE-ATORVASTATIN 5-10 MG PO TABS: 1.0000 | ORAL_TABLET | Freq: Every day | ORAL | Status: DC

## 2016-12-16 MED ORDER — SODIUM CHLORIDE 0.9 % IV SOLN
INTRAVENOUS | Status: DC
  Administered 2016-12-16: 23:00:00 via INTRAVENOUS

## 2016-12-16 MED ORDER — DEXTROSE 5 % IV SOLN: 1.0000 g | INTRAVENOUS | Status: DC

## 2016-12-16 MED ORDER — OXYCODONE-ACETAMINOPHEN 5-325 MG PO TABS
1.0000 | ORAL_TABLET | Freq: Four times a day (QID) | ORAL | Status: DC | PRN
  Administered 2016-12-17: 10:00:00 1 via ORAL
  Administered 2016-12-17: 2 via ORAL
  Filled 2016-12-16: qty 2
  Filled 2016-12-16: qty 1

## 2016-12-16 MED ORDER — MONTELUKAST SODIUM 10 MG PO TABS
10.0000 mg | ORAL_TABLET | Freq: Every day | ORAL | Status: DC
  Administered 2016-12-16 – 2016-12-18 (×3): 10 mg via ORAL
  Filled 2016-12-16 (×3): qty 1

## 2016-12-16 MED ORDER — AMITRIPTYLINE HCL 10 MG PO TABS
50.0000 mg | ORAL_TABLET | Freq: Every day | ORAL | Status: DC
  Administered 2016-12-16 – 2016-12-18 (×3): 50 mg via ORAL
  Filled 2016-12-16 (×3): qty 1

## 2016-12-16 MED ORDER — COLCHICINE 0.6 MG PO TABS
0.6000 mg | ORAL_TABLET | Freq: Every day | ORAL | Status: DC
  Administered 2016-12-17: 0.6 mg via ORAL
  Filled 2016-12-16: qty 1

## 2016-12-16 MED ORDER — TIOTROPIUM BROMIDE MONOHYDRATE 18 MCG IN CAPS
18.0000 ug | ORAL_CAPSULE | Freq: Every day | RESPIRATORY_TRACT | Status: DC
  Administered 2016-12-17 – 2016-12-19 (×3): 18 ug via RESPIRATORY_TRACT
  Filled 2016-12-16: qty 5

## 2016-12-16 MED ORDER — GUAIFENESIN-DM 100-10 MG/5ML PO SYRP
5.0000 mL | ORAL_SOLUTION | ORAL | Status: DC | PRN
  Administered 2016-12-17 (×2): 5 mL via ORAL
  Filled 2016-12-16 (×2): qty 5

## 2016-12-16 MED ORDER — ATORVASTATIN CALCIUM 20 MG PO TABS
10.0000 mg | ORAL_TABLET | Freq: Every day | ORAL | Status: DC
  Administered 2016-12-17 – 2016-12-18 (×2): 10 mg via ORAL
  Filled 2016-12-16 (×2): qty 1

## 2016-12-16 MED ORDER — METHADONE HCL 10 MG PO TABS
10.0000 mg | ORAL_TABLET | Freq: Two times a day (BID) | ORAL | Status: DC
  Administered 2016-12-17 – 2016-12-19 (×3): 10 mg via ORAL
  Filled 2016-12-16 (×3): qty 1

## 2016-12-16 MED ORDER — METHADONE HCL 5 MG PO TABS
20.0000 mg | ORAL_TABLET | Freq: Every day | ORAL | Status: DC
  Administered 2016-12-17 – 2016-12-18 (×2): 20 mg via ORAL
  Filled 2016-12-16 (×3): qty 2

## 2016-12-16 MED ORDER — FLUTICASONE PROPIONATE 50 MCG/ACT NA SUSP
1.0000 | Freq: Every day | NASAL | Status: DC
  Administered 2016-12-17 – 2016-12-19 (×3): 1 via NASAL
  Filled 2016-12-16: qty 16

## 2016-12-16 MED ORDER — DEXTROSE 5 % IV SOLN: 1.0000 g | Freq: Once | INTRAVENOUS | Status: DC

## 2016-12-16 MED ORDER — LORATADINE 10 MG PO TABS
10.0000 mg | ORAL_TABLET | Freq: Every day | ORAL | Status: DC
  Administered 2016-12-17 – 2016-12-19 (×3): 10 mg via ORAL
  Filled 2016-12-16 (×3): qty 1

## 2016-12-16 MED ORDER — PREGABALIN 75 MG PO CAPS
150.0000 mg | ORAL_CAPSULE | Freq: Two times a day (BID) | ORAL | Status: DC
  Administered 2016-12-16 – 2016-12-19 (×6): 150 mg via ORAL
  Filled 2016-12-16 (×6): qty 2

## 2016-12-16 MED ORDER — ASPIRIN 81 MG PO CHEW
81.0000 mg | CHEWABLE_TABLET | Freq: Every day | ORAL | Status: DC
  Administered 2016-12-17 – 2016-12-19 (×3): 81 mg via ORAL
  Filled 2016-12-16 (×3): qty 1

## 2016-12-16 MED ORDER — GABAPENTIN 600 MG PO TABS
600.0000 mg | ORAL_TABLET | Freq: Three times a day (TID) | ORAL | Status: DC
  Administered 2016-12-16 – 2016-12-19 (×8): 600 mg via ORAL
  Filled 2016-12-16 (×8): qty 1

## 2016-12-16 MED ORDER — DEXTROSE 5 % IV SOLN
500.0000 mg | Freq: Once | INTRAVENOUS | Status: AC
  Administered 2016-12-16: 500 mg via INTRAVENOUS
  Filled 2016-12-16: qty 500

## 2016-12-16 MED ORDER — PANTOPRAZOLE SODIUM 40 MG PO TBEC
40.0000 mg | DELAYED_RELEASE_TABLET | Freq: Every day | ORAL | Status: DC
  Administered 2016-12-17 – 2016-12-19 (×3): 40 mg via ORAL
  Filled 2016-12-16 (×3): qty 1

## 2016-12-16 NOTE — ED Triage Notes (Signed)
Pt presents to ED for L sided chest pain under L breast that began a few days ago. Denies N&V but states dizziness and SOB. No radiation. Pt is alert and oriented, in a wheelchair. Pt appears pale. States diarrhea today that was brown. Temp at home of "over 100 two times."

## 2016-12-16 NOTE — ED Notes (Addendum)
Pt reporting intermittent CP that began yesterday and generalized feeling of "not feeling right" X 3 days. Reports being very hot at times. Denies NVD. Denies blood in stool. Pt is pale.  Pt alert and oriented X4, active, cooperative, pt in NAD. RR even and unlabored, color WNL.

## 2016-12-16 NOTE — ED Notes (Signed)
ED Provider at bedside. 

## 2016-12-16 NOTE — ED Provider Notes (Signed)
[facility] Emergency Department Provider Note  ____________________________________________  Time seen: Approximately 5:42 PM  I have reviewed the triage vital signs and the nursing notes.   HISTORY  Chief Complaint Chest Pain    HPI Chloe Sanchez is a 76 y.o. female who complains of bilateral chest wall pain, shortness of breath, dyspnea on exertion, and generalized weakness worsening over the past 6 days. No exertional chest pain. Denies cough but does report having a fever of over 100 today. She has not taken any NSAIDs prior to arrival here. She reports being so weak that she can only go one or 2 steps even with her walker, and that she has fallen down at home. Denies any significant trauma from the fall. Not on blood thinners.     Past Medical History:  Diagnosis Date  . Anemia   . Anxiety   . Arthritis   . CHF (congestive heart failure) (HCC)   . Chronic kidney disease    CKD  . Depression   . Diabetes mellitus without complication (HCC)   . Gout   . Hypertension   . Hypothyroidism   . On supplemental oxygen therapy    AT NIGHT  . Polyneuropathy (HCC)   . RLS (restless legs syndrome)   . Sjogren's syndrome (HCC)   . Sleep apnea      Patient Active Problem List   Diagnosis Date Noted  . Primary osteoarthritis of knee 07/17/2015     Past Surgical History:  Procedure Laterality Date  . BACK SURGERY     lumbar  . CHOLECYSTECTOMY    . FOOT SURGERY Bilateral   . TOTAL KNEE ARTHROPLASTY Right 07/17/2015   Procedure: TOTAL KNEE ARTHROPLASTY;  Surgeon: Kennedy Bucker, MD;  Location: [facility] ORS;  Service: Orthopedics;  Laterality: Right;     Prior to Admission medications   Medication Sig Start Date End Date Taking? Authorizing Provider  albuterol (PROVENTIL HFA;VENTOLIN HFA) 108 (90 BASE) MCG/ACT inhaler Inhale 2 puffs into the lungs every 4 (four) hours as needed for wheezing or shortness of breath.    Historical Provider, MD   allopurinol (ZYLOPRIM) 100 MG tablet Take 100 mg by mouth daily.    Historical Provider, MD  amitriptyline (ELAVIL) 50 MG tablet Take 50 mg by mouth at bedtime.    Historical Provider, MD  amLODipine-atorvastatin (CADUET) 5-10 MG per tablet Take 1 tablet by mouth daily.    Historical Provider, MD  aspirin 81 MG tablet Take 81 mg by mouth daily.    Historical Provider, MD  Carboxymethylcellul-Glycerin (OPTIVE) 0.5-0.9 % SOLN Apply 1-2 drops to eye 3 (three) times daily as needed.    Historical Provider, MD  colchicine 0.6 MG tablet Take 0.6 mg by mouth daily. 2 BY MOUTH FIRST SIGN OF GOUT THEN 1 DAILY AS NEEDED    Historical Provider, MD  doxycycline (VIBRAMYCIN) 50 MG capsule Take 50 mg by mouth daily.    Historical Provider, MD  enoxaparin (LOVENOX) 40 MG/0.4ML injection Inject 0.4 mLs (40 mg total) into the skin daily. 07/21/15   Evon Slack, PA-C  epoetin alfa (EPOGEN,PROCRIT) 16109 UNIT/ML injection 20,000 Units every 14 (fourteen) days. AS NEEDED    Historical Provider, MD  ergocalciferol (VITAMIN D2) 50000 UNITS capsule Take 50,000 Units by mouth every 30 (thirty) days.    Historical Provider, MD  esomeprazole (NEXIUM) 40 MG capsule Take 40 mg by mouth daily at 12 noon. AM    Historical Provider, MD  ferrous sulfate 325 (65 FE)  MG tablet Take 325 mg by mouth daily with breakfast.    Historical Provider, MD  fexofenadine (ALLEGRA) 180 MG tablet Take 180 mg by mouth daily.    Historical Provider, MD  gabapentin (NEURONTIN) 600 MG tablet Take 600 mg by mouth 3 (three) times daily.    Historical Provider, MD  guaiFENesin-dextromethorphan (ROBITUSSIN DM) 100-10 MG/5ML syrup Take 5 mLs by mouth every 4 (four) hours as needed for cough.    Historical Provider, MD  levothyroxine (SYNTHROID, LEVOTHROID) 88 MCG tablet Take 88 mcg by mouth daily before breakfast.    Historical Provider, MD  meloxicam (MOBIC) 7.5 MG tablet Take 7.5 mg by mouth daily.    Historical Provider, MD  methadone (DOLOPHINE)  10 MG tablet Take 10 mg by mouth every 6 (six) hours as needed. 3 TABS AM, 1 TAB NOON, 4 TABS PM    Historical Provider, MD  methadone (DOLOPHINE) 10 MG tablet Take 4 tablets (40 mg total) by mouth at bedtime. 07/21/15   Evon Slack, PA-C  methadone (DOLOPHINE) 10 MG tablet Take 1 tablet (10 mg total) by mouth daily with lunch. 07/21/15   Evon Slack, PA-C  methadone (DOLOPHINE) 10 MG tablet Take 3 tablets (30 mg total) by mouth daily with breakfast. 07/21/15   Evon Slack, PA-C  mometasone (NASONEX) 50 MCG/ACT nasal spray Place 2 sprays into the nose daily.    Historical Provider, MD  montelukast (SINGULAIR) 10 MG tablet Take 10 mg by mouth at bedtime.    Historical Provider, MD  oxyCODONE (OXY IR/ROXICODONE) 5 MG immediate release tablet Take 1-2 tablets (5-10 mg total) by mouth every 3 (three) hours as needed for breakthrough pain. 07/21/15   Evon Slack, PA-C  oxyCODONE-acetaminophen (PERCOCET) 5-325 MG tablet Take 2 tablets by mouth every 6 (six) hours as needed for moderate pain or severe pain. 11/17/16   Emily Filbert, MD  polyethylene glycol [facility] Hal) packet Take 17 g by mouth daily.    Historical Provider, MD  pregabalin (LYRICA) 150 MG capsule Take 150 mg by mouth 2 (two) times daily.    Historical Provider, MD  pregabalin (LYRICA) 150 MG capsule Take 1 capsule (150 mg total) by mouth 2 (two) times daily. 07/21/15   Evon Slack, PA-C  rOPINIRole (REQUIP) 1 MG tablet Take 1 mg by mouth 3 (three) times daily.    Historical Provider, MD  tiotropium (SPIRIVA) 18 MCG inhalation capsule Place 18 mcg into inhaler and inhale daily.    Historical Provider, MD  trolamine salicylate (ASPERCREME) 10 % cream Apply 1 application topically 3 (three) times daily as needed for muscle pain.    Historical Provider, MD     Allergies Patient has no known allergies.   History reviewed. No pertinent family history.  Social History Social History  Substance Use Topics  .  Smoking status: Never Smoker  . Smokeless tobacco: Former Neurosurgeon    Types: Snuff    Quit date: 07/16/2015  . Alcohol use No    Review of Systems  Constitutional:   Positive fever.  ENT:   No sore throat. No rhinorrhea. Cardiovascular:   No chest pain. Respiratory:   Positive shortness of breath and chest wall pain. No cough. Gastrointestinal:   Negative for abdominal pain, vomiting and diarrhea. Normal oral intake Genitourinary:   Negative for dysuria or difficulty urinating. Musculoskeletal:   Negative for focal pain or swelling. Diffuse body aches Neurological:   Negative for headaches 10-point ROS otherwise negative.  ____________________________________________   PHYSICAL EXAM:  VITAL SIGNS: ED Triage Vitals  Enc Vitals Group     BP 12/17/2016 1612 137/73     Pulse Rate 12/15/2016 1612 (!) 51     Resp 12/17/2016 1612 16     Temp 01/03/2017 1612 98.8 F (37.1 C)     Temp Source 01/05/2017 1612 Oral     SpO2 12/15/2016 1612 99 %     Weight 12/09/2016 1612 139 lb (63 kg)     Height 12/18/2016 1612 5\' 2"  (1.575 m)     Head Circumference --      Peak Flow --      Pain Score 12/27/2016 1618 3     Pain Loc --      Pain Edu? --      Excl. in GC? --     Vital signs reviewed, nursing assessments reviewed.   Constitutional:   Alert and oriented. Ill-appearing. Eyes:   No scleral icterus. No conjunctival pallor. PERRL. EOMI.  No nystagmus. ENT   Head:   Normocephalic and atraumatic.   Nose:   No congestion/rhinnorhea. No septal hematoma   Mouth/Throat:   MMM, no pharyngeal erythema. No peritonsillar mass.    Neck:   No stridor. No SubQ emphysema. No meningismus. Hematological/Lymphatic/Immunilogical:   No cervical lymphadenopathy. Cardiovascular:   Bradycardia, rate 50. . Symmetric bilateral radial and DP pulses.  No murmurs.  Respiratory:   Normal respiratory effort without tachypnea nor retractions. Breath sounds are clear and equal bilaterally. No  wheezes/rales/rhonchi. Gastrointestinal:   Soft and nontender. Non distended. There is no CVA tenderness.  No rebound, rigidity, or guarding. Genitourinary:   deferred Musculoskeletal:   normal range of motion in all extremities. No joint effusions.  Positive chronic diffuse tenderness throughout bilateral feet and ankles, unchanged from baseline according to patient.  No edema. Neurologic:   Normal speech and language.  CN 2-10 normal. Motor grossly intact. No gross focal neurologic deficits are appreciated.  Skin:    Skin is warm, dry and intact. No rash noted.  No petechiae, purpura, or bullae.  ____________________________________________    LABS (pertinent positives/negatives) (all labs ordered are listed, but only abnormal results are displayed) Labs Reviewed  BASIC METABOLIC PANEL - Abnormal; Notable for the following:       Result Value   Glucose, Bld 128 (*)    BUN 30 (*)    Creatinine, Ser 1.77 (*)    GFR calc non Af Amer 27 (*)    GFR calc Af Amer 31 (*)    All other components within normal limits  CBC - Abnormal; Notable for the following:    RBC 2.99 (*)    Hemoglobin 9.1 (*)    HCT 26.8 (*)    RDW 15.0 (*)    All other components within normal limits  TROPONIN I   ____________________________________________   EKG  Interpreted by me Sinus bradycardia rate of 51, first-degree AV block with PR interval 376 ms. Normal axis. Poor R-wave progression in anterior precordial leads. Normal ST segments and T waves.  ____________________________________________    RADIOLOGY  Chest x-ray shows infiltrate in right upper lobe consistent with pneumonia. Old left-sided rib fractures.  ____________________________________________   PROCEDURES Procedures  ____________________________________________   INITIAL IMPRESSION / ASSESSMENT AND PLAN / ED COURSE  Pertinent labs & imaging results that were available during my care of the patient were reviewed by me and  considered in my medical decision making (see chart for details).  Patient presents  with shortness of breath, found to have infiltrate on x-ray consistent with community-acquired pneumonia. Due to her comorbidities and acute illness severely limiting her ability to do ADLs, all discussed the case with the hospitalist for further management. Ceftriaxone and azithromycin for now for treatment of pneumonia.       ____________________________________________   FINAL CLINICAL IMPRESSION(S) / ED DIAGNOSES  Final diagnoses:  Chest wall pain  Community acquired pneumonia of right upper lobe of lung (HCC)  Generalized weakness  Bradycardia      New Prescriptions   No medications on file     Portions of this note were generated with dragon dictation software. Dictation errors may occur despite best attempts at proofreading.    Sharman CheekPhillip Arlie Posch, MD 01/02/2017 [phone]

## 2016-12-16 NOTE — ED Notes (Signed)
Pt provided graham crackers, applesauce, and peanut butter.

## 2016-12-16 NOTE — Progress Notes (Signed)
Family Meeting Note  Advance Directive:no  Today a meeting took place with the Patient. Her son.   The following clinical team members were present during this meeting:MD  The following were discussed:[name] diagnosis:DM, HTN, CHF, CKD, Pneumonia , [name] progosis: Unable to determine and Goals for treatment: Continue present management  Additional follow-up to be provided: PMD  Time spent during discussion:20 minutes  Milbern Doescher, Heath GoldVAIBHAVKUMAR, MD

## 2016-12-16 NOTE — H&P (Signed)
[facility] [place] [facility]   PATIENT NAME: Chloe Sanchez    MR#:  [id number]  DATE OF BIRTH:  03-Dec-1940  DATE OF ADMISSION:  12/26/2016  PRIMARY CARE PHYSICIAN: Delton Prairie, FNP   REQUESTING/REFERRING PHYSICIAN: Salvadore Dom  CHIEF COMPLAINT:   Chief Complaint  Patient presents with  . Chest Pain    HISTORY OF PRESENT ILLNESS: Chloe Sanchez  is a 76 y.o. female with a known history of Arthritis, CHF, CKD, depression, diabetes, hypertension- was in ER last week after she had a fall but found no abnormalities and sent home. For last 3-4 days she is feeling more weak and short of breath on minimal exertion and also has some worsening of her regular coughing. She also had fever at home but she did not check the temperature. She went to her primary care doctor's office yesterday and they sent her home after just checking and did not give any medicine. Today again she was feeling weak so they called primary care physician's office and they suggested to go to emergency room. In ER she is noted to have pneumonia and she is running slight bradycardia so ER physician advised to admit to hospital.  PAST MEDICAL HISTORY:   Past Medical History:  Diagnosis Date  . Anemia   . Anxiety   . Arthritis   . CHF (congestive heart failure) (HCC)   . Chronic kidney disease    CKD  . Depression   . Diabetes mellitus without complication (HCC)   . Gout   . Hypertension   . Hypothyroidism   . On supplemental oxygen therapy    AT NIGHT  . Polyneuropathy (HCC)   . RLS (restless legs syndrome)   . Sjogren's syndrome (HCC)   . Sleep apnea     PAST SURGICAL HISTORY: Past Surgical History:  Procedure Laterality Date  . BACK SURGERY     lumbar  . CHOLECYSTECTOMY    . FOOT SURGERY Bilateral   . TOTAL KNEE ARTHROPLASTY Right 07/17/2015   Procedure: TOTAL KNEE ARTHROPLASTY;  Surgeon: Kennedy Bucker, MD;  Location: [facility] ORS;  Service: Orthopedics;  Laterality: Right;     SOCIAL HISTORY:  Social History  Substance Use Topics  . Smoking status: Never Smoker  . Smokeless tobacco: Former Neurosurgeon    Types: Snuff    Quit date: 07/16/2015  . Alcohol use No    FAMILY HISTORY:  Family History  Problem Relation Age of Onset  . Lung cancer Sister     DRUG ALLERGIES: No Known Allergies  REVIEW OF SYSTEMS:   CONSTITUTIONAL: No fever,Positive for fatigue or weakness.  EYES: No blurred or double vision.  EARS, NOSE, AND THROAT: No tinnitus or ear pain.  RESPIRATORY: Positive for cough, shortness of breath, no wheezing or hemoptysis.  CARDIOVASCULAR: No chest pain, orthopnea, edema.  GASTROINTESTINAL: No nausea, vomiting, diarrhea or abdominal pain.  GENITOURINARY: No dysuria, hematuria.  ENDOCRINE: No polyuria, nocturia,  HEMATOLOGY: No anemia, easy bruising or bleeding SKIN: No rash or lesion. MUSCULOSKELETAL: No joint pain or arthritis.   NEUROLOGIC: No tingling, numbness, weakness.  PSYCHIATRY: No anxiety or depression.   MEDICATIONS AT HOME:  Prior to Admission medications   Medication Sig Start Date End Date Taking? Authorizing Provider  albuterol (PROVENTIL HFA;VENTOLIN HFA) 108 (90 BASE) MCG/ACT inhaler Inhale 2 puffs into the lungs every 4 (four) hours as needed for wheezing or shortness of breath.    Historical Provider, MD  allopurinol (ZYLOPRIM) 100 MG tablet Take 100  mg by mouth daily.    Historical Provider, MD  amitriptyline (ELAVIL) 50 MG tablet Take 50 mg by mouth at bedtime.    Historical Provider, MD  amLODipine-atorvastatin (CADUET) 5-10 MG per tablet Take 1 tablet by mouth daily.    Historical Provider, MD  aspirin 81 MG tablet Take 81 mg by mouth daily.    Historical Provider, MD  Carboxymethylcellul-Glycerin (OPTIVE) 0.5-0.9 % SOLN Apply 1-2 drops to eye 3 (three) times daily as needed.    Historical Provider, MD  colchicine 0.6 MG tablet Take 0.6 mg by mouth daily. 2 BY MOUTH FIRST SIGN OF GOUT THEN 1 DAILY AS NEEDED    Historical  Provider, MD  doxycycline (VIBRAMYCIN) 50 MG capsule Take 50 mg by mouth daily.    Historical Provider, MD  enoxaparin (LOVENOX) 40 MG/0.4ML injection Inject 0.4 mLs (40 mg total) into the skin daily. 07/21/15   Evon Slack, PA-C  epoetin alfa (EPOGEN,PROCRIT) 53664 UNIT/ML injection 20,000 Units every 14 (fourteen) days. AS NEEDED    Historical Provider, MD  ergocalciferol (VITAMIN D2) 50000 UNITS capsule Take 50,000 Units by mouth every 30 (thirty) days.    Historical Provider, MD  esomeprazole (NEXIUM) 40 MG capsule Take 40 mg by mouth daily at 12 noon. AM    Historical Provider, MD  ferrous sulfate 325 (65 FE) MG tablet Take 325 mg by mouth daily with breakfast.    Historical Provider, MD  fexofenadine (ALLEGRA) 180 MG tablet Take 180 mg by mouth daily.    Historical Provider, MD  gabapentin (NEURONTIN) 600 MG tablet Take 600 mg by mouth 3 (three) times daily.    Historical Provider, MD  guaiFENesin-dextromethorphan (ROBITUSSIN DM) 100-10 MG/5ML syrup Take 5 mLs by mouth every 4 (four) hours as needed for cough.    Historical Provider, MD  levothyroxine (SYNTHROID, LEVOTHROID) 88 MCG tablet Take 88 mcg by mouth daily before breakfast.    Historical Provider, MD  meloxicam (MOBIC) 7.5 MG tablet Take 7.5 mg by mouth daily.    Historical Provider, MD  methadone (DOLOPHINE) 10 MG tablet Take 10 mg by mouth every 6 (six) hours as needed. 3 TABS AM, 1 TAB NOON, 4 TABS PM    Historical Provider, MD  methadone (DOLOPHINE) 10 MG tablet Take 4 tablets (40 mg total) by mouth at bedtime. 07/21/15   Evon Slack, PA-C  methadone (DOLOPHINE) 10 MG tablet Take 1 tablet (10 mg total) by mouth daily with lunch. 07/21/15   Evon Slack, PA-C  methadone (DOLOPHINE) 10 MG tablet Take 3 tablets (30 mg total) by mouth daily with breakfast. 07/21/15   Evon Slack, PA-C  mometasone (NASONEX) 50 MCG/ACT nasal spray Place 2 sprays into the nose daily.    Historical Provider, MD  montelukast (SINGULAIR) 10 MG  tablet Take 10 mg by mouth at bedtime.    Historical Provider, MD  oxyCODONE (OXY IR/ROXICODONE) 5 MG immediate release tablet Take 1-2 tablets (5-10 mg total) by mouth every 3 (three) hours as needed for breakthrough pain. 07/21/15   Evon Slack, PA-C  oxyCODONE-acetaminophen (PERCOCET) 5-325 MG tablet Take 2 tablets by mouth every 6 (six) hours as needed for moderate pain or severe pain. 11/17/16   Emily Filbert, MD  polyethylene glycol [facility] Ethelene Hal) packet Take 17 g by mouth daily.    Historical Provider, MD  pregabalin (LYRICA) 150 MG capsule Take 150 mg by mouth 2 (two) times daily.    Historical Provider, MD  pregabalin (LYRICA) 150  MG capsule Take 1 capsule (150 mg total) by mouth 2 (two) times daily. 07/21/15   Evon Slackhomas C Gaines, PA-C  rOPINIRole (REQUIP) 1 MG tablet Take 1 mg by mouth 3 (three) times daily.    Historical Provider, MD  tiotropium (SPIRIVA) 18 MCG inhalation capsule Place 18 mcg into inhaler and inhale daily.    Historical Provider, MD  trolamine salicylate (ASPERCREME) 10 % cream Apply 1 application topically 3 (three) times daily as needed for muscle pain.    Historical Provider, MD      PHYSICAL EXAMINATION:   VITAL SIGNS: Blood pressure (!) 160/66, pulse (!) 48, temperature 98.8 F (37.1 C), temperature source Oral, resp. rate 16, height 5\' 2"  (1.575 m), weight 63 kg (139 lb), SpO2 100 %.  GENERAL:  76 y.o.-year-old patient lying in the bed with no acute distress.  EYES: Pupils equal, round, reactive to light and accommodation. No scleral icterus. Extraocular muscles intact.  HEENT: Head atraumatic, normocephalic. Oropharynx and nasopharynx clear.  NECK:  Supple, no jugular venous distention. No thyroid enlargement, no tenderness.  LUNGS: Normal breath sounds bilaterally, no wheezing,Some crepitation. No use of accessory muscles of respiration.  CARDIOVASCULAR: S1, S2 normal. systolic murmurs.  ABDOMEN: Soft, nontender, nondistended. Bowel sounds  present. No organomegaly or mass.  EXTREMITIES: No pedal edema, cyanosis, or clubbing. Surgical scar on right ankle. NEUROLOGIC: Cranial nerves II through XII are intact. Muscle strength 4/5 in all extremities. Sensation intact. Gait not checked.  PSYCHIATRIC: The patient is alert and oriented x 3.  SKIN: No obvious rash, lesion, or ulcer.   LABORATORY PANEL:   CBC  Recent Labs Lab 11-15-2016 1613  WBC 8.6  HGB 9.1*  HCT 26.8*  PLT 295  MCV 89.8  MCH 30.5  MCHC 33.9  RDW 15.0*   ------------------------------------------------------------------------------------------------------------------  Chemistries   Recent Labs Lab 11-15-2016 1613  NA 140  K 4.2  CL 108  CO2 22  GLUCOSE 128*  BUN 30*  CREATININE 1.77*  CALCIUM 9.1   ------------------------------------------------------------------------------------------------------------------ estimated creatinine clearance is 24 mL/min (by C-G formula based on SCr of 1.77 mg/dL (H)). ------------------------------------------------------------------------------------------------------------------ No results for input(s): TSH, T4TOTAL, T3FREE, THYROIDAB in the last 72 hours.  Invalid input(s): FREET3   Coagulation profile No results for input(s): INR, PROTIME in the last 168 hours. ------------------------------------------------------------------------------------------------------------------- No results for input(s): DDIMER in the last 72 hours. -------------------------------------------------------------------------------------------------------------------  Cardiac Enzymes  Recent Labs Lab 11-15-2016 1613  TROPONINI <0.03   ------------------------------------------------------------------------------------------------------------------ Invalid input(s): POCBNP  ---------------------------------------------------------------------------------------------------------------  Urinalysis    Component Value  Date/Time   COLORURINE YELLOW (A) 07/09/2015 1241   APPEARANCEUR CLEAR (A) 07/09/2015 1241   APPEARANCEUR Clear 12/11/2012 2330   LABSPEC 1.016 07/09/2015 1241   LABSPEC 1.020 12/11/2012 2330   PHURINE 5.0 07/09/2015 1241   GLUCOSEU NEGATIVE 07/09/2015 1241   GLUCOSEU Negative 12/11/2012 2330   HGBUR NEGATIVE 07/09/2015 1241   BILIRUBINUR NEGATIVE 07/09/2015 1241   BILIRUBINUR Negative 12/11/2012 2330   KETONESUR NEGATIVE 07/09/2015 1241   PROTEINUR 100 (A) 07/09/2015 1241   NITRITE NEGATIVE 07/09/2015 1241   LEUKOCYTESUR TRACE (A) 07/09/2015 1241   LEUKOCYTESUR 1+ 12/11/2012 2330     RADIOLOGY: Dg Chest 2 View  Result Date: 12/15/2016 CLINICAL DATA:  LEFT chest pain beginning a few days ago. Dizziness and shortness of breath. History of CHF, diabetes. EXAM: CHEST  2 VIEW COMPARISON:  Chest radiograph December 29, 2015 FINDINGS: Cardiomediastinal silhouette is normal. Mildly calcified aortic knob. Increasing mild interstitial prominence with faint RIGHT upper lobe airspace opacities.  Similar bibasilar strandy densities. Flattened hemidiaphragms. No pleural effusion. No pneumothorax. Osteopenia. Broad dextroscoliosis and mild degenerative change of the spine. Old LEFT rib fractures. Surgical clips in the included right abdomen compatible with cholecystectomy. IMPRESSION: Increasing interstitial prominence with patchy RIGHT upper lobe airspace opacities concerning for bronchopneumonia. Followup PA and lateral chest X-ray is recommended in 3-4 weeks following trial of antibiotic therapy to ensure resolution and exclude underlying malignancy. Similar bibasilar atelectasis/scarring. Electronically Signed   By: Awilda Metro M.D.   On: Dec 18, 2016 16:57    EKG: Orders placed or performed during the hospital encounter of 12/18/16  . EKG 12-Lead  . EKG 12-Lead  . ED EKG within 10 minutes  . ED EKG within 10 minutes    IMPRESSION AND PLAN:  * Pneumonia   We'll give IV antibiotics.  Rocephin plus azithromycin.   Encourage use of incentive spirometer  * Acute on chronic renal failure   Creatinine is higher than baseline creatinine which was 1 year ago.   We'll give gentle IV hydration and monitor.   She has history of congestive heart failure as per our records but I could not find any echocardiogram and the patient does not know about it.   We will be watchful about fluid overload.  * Hypertension   Continue home medication. Stable.  * Diabetes   Keep on insulin sliding scale coverage.  * History of congestive heart failure   We do not have any echocardiogram or previous testing done about this.   Currently she is not in fluid overload.  * Hypothyroidism   Continue levothyroxine.  * COPD   No exacerbation, continue inhalers and Spiriva.  All the records are reviewed and case discussed with ED provider. Management plans discussed with the patient, family and they are in agreement.  CODE STATUS: Limited code, she does not want to have intubation or cold on the ventilator, but if her heart stops she would like to have CPR and defibrillator.  Code Status History    Date Active Date Inactive Code Status Order ID Comments User Context   07/17/2015 12:13 PM 07/21/2015  4:39 PM Full Code [id number]  Kennedy Bucker, MD Inpatient     [name] son who is also healthcare power of attorney was present in the room during my visit.  TOTAL TIME TAKING CARE OF THIS PATIENT: 50 minutes.    Altamese Dilling M.D on 2016/12/18   Between 7am to 6pm - Pager - [phone]  After 6pm go to [email or web address] - password [email or web address]  [facility]  Office  [phone]  CC: Primary care physician; Delton Prairie, FNP   Note: This dictation was prepared with Dragon dictation along with smaller phrase technology. Any transcriptional errors that result from this process are unintentional.

## 2016-12-16 NOTE — ED Notes (Signed)
Admitting MD at bedside.

## 2016-12-17 LAB — CBC
HEMATOCRIT: 25.8 % — AB (ref 35.0–47.0)
Hemoglobin: 8.5 g/dL — ABNORMAL LOW (ref 12.0–16.0)
MCH: 30 pg (ref 26.0–34.0)
MCHC: 33 g/dL (ref 32.0–36.0)
MCV: 90.9 fL (ref 80.0–100.0)
PLATELETS: 274 10*3/uL (ref 150–440)
RBC: 2.83 MIL/uL — ABNORMAL LOW (ref 3.80–5.20)
RDW: 14.7 % — AB (ref 11.5–14.5)
WBC: 7.5 10*3/uL (ref 3.6–11.0)

## 2016-12-17 LAB — BASIC METABOLIC PANEL
Anion gap: 8 (ref 5–15)
BUN: 27 mg/dL — AB (ref 6–20)
CALCIUM: 8.7 mg/dL — AB (ref 8.9–10.3)
CO2: 22 mmol/L (ref 22–32)
CREATININE: 1.51 mg/dL — AB (ref 0.44–1.00)
Chloride: 110 mmol/L (ref 101–111)
GFR calc Af Amer: 38 mL/min — ABNORMAL LOW (ref >60)
GFR, EST NON AFRICAN AMERICAN: 33 mL/min — AB (ref >60)
Glucose, Bld: 95 mg/dL (ref 65–99)
Potassium: 3.9 mmol/L (ref 3.5–5.1)
SODIUM: 140 mmol/L (ref 135–145)

## 2016-12-17 LAB — GLUCOSE, CAPILLARY
GLUCOSE-CAPILLARY: 83 mg/dL (ref 65–99)
GLUCOSE-CAPILLARY: 152 mg/dL — AB (ref 65–99)
GLUCOSE-CAPILLARY: 100 mg/dL — AB (ref 65–99)
Glucose-Capillary: 139 mg/dL — ABNORMAL HIGH (ref 65–99)

## 2016-12-17 LAB — MRSA PCR SCREENING: MRSA BY PCR: POSITIVE — AB

## 2016-12-17 MED ORDER — LEVOFLOXACIN 750 MG PO TABS
750.0000 mg | ORAL_TABLET | ORAL | Status: DC
  Administered 2016-12-17: 23:00:00 750 mg via ORAL
  Filled 2016-12-17: qty 1

## 2016-12-17 MED ORDER — IPRATROPIUM-ALBUTEROL 0.5-2.5 (3) MG/3ML IN SOLN
3.0000 mL | Freq: Three times a day (TID) | RESPIRATORY_TRACT | Status: DC
  Administered 2016-12-17 – 2016-12-20 (×6): 3 mL via RESPIRATORY_TRACT
  Filled 2016-12-17 (×6): qty 3

## 2016-12-17 MED ORDER — MUPIROCIN 2 % EX OINT
1.0000 "application " | TOPICAL_OINTMENT | Freq: Two times a day (BID) | CUTANEOUS | Status: AC
  Administered 2016-12-17 – 2016-12-21 (×10): 1 via NASAL
  Filled 2016-12-17 (×2): qty 22

## 2016-12-17 MED ORDER — MUPIROCIN 2 % EX OINT: 1.0000 "application " | TOPICAL_OINTMENT | Freq: Two times a day (BID) | CUTANEOUS | Status: DC

## 2016-12-17 MED ORDER — CHLORHEXIDINE GLUCONATE CLOTH 2 % EX PADS
6.0000 | MEDICATED_PAD | Freq: Every day | CUTANEOUS | Status: AC
  Administered 2016-12-17 – 2016-12-21 (×5): 6 via TOPICAL

## 2016-12-17 MED ORDER — COLCHICINE 0.6 MG PO TABS: 0.6000 mg | ORAL_TABLET | Freq: Every day | ORAL | Status: DC | PRN

## 2016-12-17 MED ORDER — ACETAMINOPHEN 325 MG PO TABS: 650.0000 mg | ORAL_TABLET | Freq: Four times a day (QID) | ORAL | Status: DC | PRN

## 2016-12-17 NOTE — Evaluation (Signed)
Physical Therapy Evaluation Patient Details Name: Chloe Sanchez MRN: [id number] DOB: 1941-10-24 Today's Date: 12/17/2016   History of Present Illness  Pt is 76 yo female, diagnosed w/ pnuemonia and bradycardia. has history of; OA, CHF, CKD, Depression, DM, HTN, COPD, had a recent fall about a week ago. Also has a R TKR and R ankle fusion    Clinical Impression  Pt alert, awake and demonstrated good communication and safety awareness during eval. She stated she was feeling a little better this morning. Overall UE and R LE strength and ROM appears WFLs for tasks assessed. Pt has strength and ROM deficits on the R LE due to history of R TKR and ankle surgeries. Pt able to perform bed mobility independently, transfer to standing w/ RW w/ PT supervision w/ some dizziness that resolved after 30 seconds. Pt ambulated using a RW for 50' with a limp and decreased stance time on the R leg and with a slow gait speed. Ambulation was limited due to fatigue. Pt currently displays strength, ROM, and balance deficits as well as decreased activity tolerance secondary to medical status that limit functional mobility. She will benefit from skilled PT correct above deficits; recommend HHPT following acute hospital stay.      Follow Up Recommendations Home health PT    Equipment Recommendations  Rolling walker with 5" wheels    Recommendations for Other Services OT consult     Precautions / Restrictions Precautions Precautions: Fall Restrictions Weight Bearing Restrictions: No      Mobility  Bed Mobility Overal bed mobility: Independent                Transfers Overall transfer level: Needs assistance Equipment used: Rolling walker (2 wheeled) Transfers: Sit to/from Stand Sit to Stand: Supervision         General transfer comment: good push off w/ UE, safe transfer w/ RW, some dizziness but improved w/ stance  Ambulation/Gait Ambulation/Gait assistance: Min guard Ambulation Distance  (Feet): 50 Feet Assistive device: Rolling walker (2 wheeled) Gait Pattern/deviations: Step-to pattern;Decreased step length - right;Decreased stance time - left   Gait velocity interpretation: Below normal speed for age/gender General Gait Details: pt ambulated w/ a limp on the R side secondary to history of TKR and ankle surgery,   Stairs            Wheelchair Mobility    Modified Rankin (Stroke Patients Only)       Balance Overall balance assessment: Needs assistance Sitting-balance support: Feet supported Sitting balance-Leahy Scale: Normal     Standing balance support: Bilateral upper extremity supported Standing balance-Leahy Scale: Good Standing balance comment: good static balance, RW for additional stability                             Pertinent Vitals/Pain Pain Assessment: No/denies pain    Home Living Family/patient expects to be discharged to:: Private residence Living Arrangements: Alone Available Help at Discharge: Family Type of Home: House Home Access: Level entry     Home Layout: One level Home Equipment: Environmental consultant - 4 wheels      Prior Function Level of Independence: Independent with assistive device(s)         Comments: using 4 wheel walker for ambulation      Hand Dominance        Extremity/Trunk Assessment   Upper Extremity Assessment Upper Extremity Assessment: Overall WFL for tasks assessed    Lower Extremity Assessment  Lower Extremity Assessment: Overall WFL for tasks assessed       Communication   Communication: No difficulties  Cognition Arousal/Alertness: Awake/alert Behavior During Therapy: WFL for tasks assessed/performed Overall Cognitive Status: Within Functional Limits for tasks assessed                      General Comments      Exercises     Assessment/Plan    PT Assessment Patient needs continued PT services  PT Problem List Decreased strength;Decreased range of motion;Decreased  activity tolerance;Decreased balance;Decreased knowledge of use of DME;Decreased mobility;Cardiopulmonary status limiting activity          PT Treatment Interventions DME instruction;Gait training;Stair training;Functional mobility training;Balance training;Therapeutic exercise;Therapeutic activities;Patient/family education    PT Goals (Current goals can be found in the Care Plan section)  Acute Rehab PT Goals Patient Stated Goal: Return home PT Goal Formulation: With patient Time For Goal Achievement: 12/31/16 Potential to Achieve Goals: Good    Frequency Min 2X/week   Barriers to discharge Inaccessible home environment;Decreased caregiver support      Co-evaluation               End of Session Equipment Utilized During Treatment: Gait belt Activity Tolerance: Patient limited by fatigue Patient left: in chair;with chair alarm set;with call bell/phone within reach;with family/visitor present           Time: [id number] PT Time Calculation (min) (ACUTE ONLY): 29 min   Charges:         PT G Codes:        [employer] Student PT 12/17/2016, 1:09 PM

## 2016-12-17 NOTE — Care Management (Signed)
Admitted to [facility] with the diagnosis of pneumonia. Lives alone. Son is Romeo AppleBen [phone]). States she joined [employer] in 2010.  States she is no longer a member of [employer] x 4 months. States she has been Dr. Shon BatonBrooks in the past, "but he didn't do anything for me." Encouraged to get another physician. Decreased appetite. Lost 3 pounds. Home Health 3 months ago, but can't remember name of agency[facility]. Wears oxygen 2 liters per nasal cannula at night only.  Doesn't know who the agency is that supplies oxygen. Larey SeatFell prior to coming to the hospital. Takes care of all basic activities of daily living herself, drives. Rolling walker in the home. Son will transport. Gwenette GreetBrenda s Kassie Keng RN MSN CCM Care management

## 2016-12-17 NOTE — Progress Notes (Addendum)
[facility] - [place] at [facility]   PATIENT NAME: Chloe Sanchez    MR#:  [id number]  DATE OF BIRTH:  [birth year]  SUBJECTIVE:  CHIEF COMPLAINT:   Chief Complaint  Patient presents with  . Chest Pain   Better cough and shortness of breath. REVIEW OF SYSTEMS:  Review of Systems  Constitutional: Positive for malaise/fatigue. Negative for chills and fever.  HENT: Negative for congestion.   Eyes: Negative for blurred vision and double vision.  Respiratory: Positive for cough and shortness of breath. Negative for hemoptysis, sputum production, wheezing and stridor.   Cardiovascular: Negative for chest pain and leg swelling.  Gastrointestinal: Negative for abdominal pain, blood in stool, constipation, melena, nausea and vomiting.  Genitourinary: Negative for hematuria and urgency.  Musculoskeletal: Negative for joint pain.  Skin: Negative for itching and rash.  Neurological: Positive for weakness. Negative for dizziness, focal weakness, loss of consciousness and headaches.  Psychiatric/Behavioral: Negative for depression. The patient is not nervous/anxious.     DRUG ALLERGIES:  No Known Allergies VITALS:  Blood pressure (!) 129/42, pulse (!) 55, temperature 98.1 F (36.7 C), temperature source Oral, resp. rate 18, height 5\' 2"  (1.575 m), weight 138 lb 14.4 oz (63 kg), SpO2 94 %. PHYSICAL EXAMINATION:  Physical Exam  Constitutional: She is oriented to person, place, and time and well-developed, well-nourished, and in no distress.  HENT:  Head: Normocephalic.  Mouth/Throat: Oropharynx is clear and moist.  Eyes: Conjunctivae and EOM are normal.  Neck: Normal range of motion. Neck supple. No JVD present. No tracheal deviation present.  Cardiovascular: Normal rate and regular rhythm.  Exam reveals no gallop.   Murmur heard. Pulmonary/Chest: Effort normal and breath sounds normal. No respiratory distress. She has no wheezes. She has no rales.  Abdominal: Soft.  Bowel sounds are normal. She exhibits no distension. There is no tenderness.  Musculoskeletal: Normal range of motion. She exhibits no edema or tenderness.  Neurological: She is alert and oriented to person, place, and time. No cranial nerve deficit.  Skin: No rash noted. No erythema.  Psychiatric: Affect normal.   LABORATORY PANEL:   CBC  Recent Labs Lab 12/17/16 0649  WBC 7.5  HGB 8.5*  HCT 25.8*  PLT 274   ------------------------------------------------------------------------------------------------------------------ Chemistries   Recent Labs Lab 12/17/16 0649  NA 140  K 3.9  CL 110  CO2 22  GLUCOSE 95  BUN 27*  CREATININE 1.51*  CALCIUM 8.7*   RADIOLOGY:  Dg Chest 2 View  Result Date: 12/15/2016 CLINICAL DATA:  LEFT chest pain beginning a few days ago. Dizziness and shortness of breath. History of CHF, diabetes. EXAM: CHEST  2 VIEW COMPARISON:  Chest radiograph December 29, 2015 FINDINGS: Cardiomediastinal silhouette is normal. Mildly calcified aortic knob. Increasing mild interstitial prominence with faint RIGHT upper lobe airspace opacities. Similar bibasilar strandy densities. Flattened hemidiaphragms. No pleural effusion. No pneumothorax. Osteopenia. Broad dextroscoliosis and mild degenerative change of the spine. Old LEFT rib fractures. Surgical clips in the included right abdomen compatible with cholecystectomy. IMPRESSION: Increasing interstitial prominence with patchy RIGHT upper lobe airspace opacities concerning for bronchopneumonia. Followup PA and lateral chest X-ray is recommended in 3-4 weeks following trial of antibiotic therapy to ensure resolution and exclude underlying malignancy. Similar bibasilar atelectasis/scarring. Electronically Signed   By: Awilda Metroourtnay  Bloomer M.D.   On: 12/30/2016 16:57   ASSESSMENT AND PLAN:   * Pneumonia   Continue  Rocephin plus azithromycin.   Encourage use of incentive spirometer  * Acute on  chronic renal failure    Creatinine is higher than baseline creatinine which was 1 year ago.    improving with IVF.  She has history of congestive heart failure as per our records but I could not find any echocardiogram and the patient does not know about it. Discontinue IV fluids.  * Hypertension   Continue home medication. Stable.  * Diabetes   Keep on insulin sliding scale coverage.  * History of congestive heart failure   We do not have any echocardiogram or previous testing done about this.   Currently she is not in fluid overload.  * Hypothyroidism   Continue levothyroxine.  * COPD   No exacerbation, continue inhalers and Spiriva.  All the records are reviewed and case discussed with Care Management/Social Worker. Management plans discussed with the patient, family and they are in agreement.  CODE STATUS: Limited code (DNI)  TOTAL TIME TAKING CARE OF THIS PATIENT: 35 minutes.   More than 50% of the time was spent in counseling/coordination of care: YES  POSSIBLE D/C IN 2 DAYS, DEPENDING ON CLINICAL CONDITION.   Shaune Pollack M.D on 12/17/2016 at 3:52 PM  Between 7am to 6pm - Pager - [phone]  After 6pm go to [email or web address] - Social research officer, government  [facility] [place] [facility]  Office  [phone]  CC: Primary care physician; Delton Prairie, FNP  Note: This dictation was prepared with Dragon dictation along with smaller phrase technology. Any transcriptional errors that result from this process are unintentional.

## 2016-12-17 NOTE — Consult Note (Signed)
Pharmacy Antibiotic Note  Chloe Sanchez is a 76 y.o. female admitted on 12/17/2016 with pneumonia.  Pharmacy has been consulted for levofloxacin dosing.  Plan: levofloxacin 750mg  po q 48 hours. recommend tx for a total of 5 days  Height: 5\' 2"  (157.5 cm) Weight: 138 lb 14.4 oz (63 kg) IBW/kg (Calculated) : 50.1  Temp (24hrs), Avg:98 F (36.7 C), Min:97.8 F (36.6 C), Max:98.1 F (36.7 C)   Recent Labs Lab 10-22-17 1613 12/17/16 0649  WBC 8.6 7.5  CREATININE 1.77* 1.51*    Estimated Creatinine Clearance: 28.1 mL/min (by C-G formula based on SCr of 1.51 mg/dL (H)).    No Known Allergies  Antimicrobials this admission: Azithro 2/8 >>2/9 CTX 2/9 >>2/9 Levofloxacin 2/9>>  Dose adjustments this admission:   Microbiology results:   Thank you for allowing pharmacy to be a part of this patient's care.  Chloe FlossMelissa D Faven Watterson, Pharm.D, BCPS Clinical Pharmacist  12/17/2016 4:21 PM

## 2016-12-18 ENCOUNTER — Inpatient Hospital Stay: Payer: Medicare HMO

## 2016-12-18 LAB — GLUCOSE, CAPILLARY
GLUCOSE-CAPILLARY: 121 mg/dL — AB (ref 65–99)
Glucose-Capillary: 91 mg/dL (ref 65–99)
Glucose-Capillary: 91 mg/dL (ref 65–99)
Glucose-Capillary: 129 mg/dL — ABNORMAL HIGH (ref 65–99)

## 2016-12-18 LAB — BASIC METABOLIC PANEL
ANION GAP: 6 (ref 5–15)
BUN: 33 mg/dL — ABNORMAL HIGH (ref 6–20)
CHLORIDE: 106 mmol/L (ref 101–111)
CO2: 24 mmol/L (ref 22–32)
Calcium: 8.5 mg/dL — ABNORMAL LOW (ref 8.9–10.3)
Creatinine, Ser: 2.47 mg/dL — ABNORMAL HIGH (ref 0.44–1.00)
GFR calc non Af Amer: 18 mL/min — ABNORMAL LOW (ref >60)
GFR, EST AFRICAN AMERICAN: 21 mL/min — AB (ref >60)
Glucose, Bld: 103 mg/dL — ABNORMAL HIGH (ref 65–99)
POTASSIUM: 4.4 mmol/L (ref 3.5–5.1)
SODIUM: 136 mmol/L (ref 135–145)

## 2016-12-18 LAB — CBC
HEMATOCRIT: 23.1 % — AB (ref 35.0–47.0)
HEMOGLOBIN: 7.8 g/dL — AB (ref 12.0–16.0)
MCH: 30.2 pg (ref 26.0–34.0)
MCHC: 33.6 g/dL (ref 32.0–36.0)
MCV: 89.8 fL (ref 80.0–100.0)
PLATELETS: 258 10*3/uL (ref 150–440)
RBC: 2.57 MIL/uL — AB (ref 3.80–5.20)
RDW: 14.9 % — ABNORMAL HIGH (ref 11.5–14.5)
WBC: 7.8 10*3/uL (ref 3.6–11.0)

## 2016-12-18 MED ORDER — SODIUM CHLORIDE 0.9 % IV SOLN
INTRAVENOUS | Status: DC
  Administered 2016-12-18 – 2016-12-19 (×2): via INTRAVENOUS

## 2016-12-18 MED ORDER — PIPERACILLIN-TAZOBACTAM 3.375 G IVPB
3.3750 g | Freq: Two times a day (BID) | INTRAVENOUS | Status: DC
  Administered 2016-12-18 – 2016-12-19 (×2): 3.375 g via INTRAVENOUS
  Filled 2016-12-18 (×2): qty 50

## 2016-12-18 MED ORDER — VANCOMYCIN HCL IN DEXTROSE 1-5 GM/200ML-% IV SOLN
1000.0000 mg | INTRAVENOUS | Status: DC
  Filled 2016-12-18: qty 200

## 2016-12-18 MED ORDER — ATORVASTATIN CALCIUM 20 MG PO TABS
40.0000 mg | ORAL_TABLET | Freq: Every day | ORAL | Status: DC
  Administered 2016-12-18: 40 mg via ORAL
  Filled 2016-12-18: qty 2

## 2016-12-18 MED ORDER — PIPERACILLIN-TAZOBACTAM 3.375 G IVPB
3.3750 g | Freq: Three times a day (TID) | INTRAVENOUS | Status: DC
  Administered 2016-12-18 (×2): 3.375 g via INTRAVENOUS
  Filled 2016-12-18 (×2): qty 50

## 2016-12-18 MED ORDER — VANCOMYCIN HCL IN DEXTROSE 1-5 GM/200ML-% IV SOLN
1000.0000 mg | Freq: Once | INTRAVENOUS | Status: AC
  Administered 2016-12-18: 02:00:00 1000 mg via INTRAVENOUS
  Filled 2016-12-18: qty 200

## 2016-12-18 NOTE — Progress Notes (Signed)
Chaplain responded to a rapid response. Since the medical team was still in the room with the patient, Chaplain waited outside and returned for a follow up after the medical team finished its job. Pt. Had problems with communication. She could not clearly utter words. Chaplain prayed for patient and left the room.

## 2016-12-18 NOTE — Progress Notes (Signed)
Pt had another episode of seizure like activity, pt speech is slurred, she had tremors, and she is disoriented to the day and month. A stat head CT was ordered by Dr. Imogene Burnhen and he ordered the MRI be done tonight as well. Vitals are stable.

## 2016-12-18 NOTE — Progress Notes (Signed)
Pharmacy Antibiotic Note  Chloe Sanchez is a 76 y.o. female admitted on 12/11/2016 with pneumonia.  Pharmacy has been consulted for vancomycin and Zosyn dosing.  Plan: Patient serum creatinine increased significantly from 1.5 >> ~2.5 (AKI). Will dose Vancomycin per levels for now. Will order random vancomycin level 36 hours post initial dose. Patient likely will need q48 hour dosing. Will need to reorder Vancomycin upon result of the Random Level    Zosyn: Will dose as if CrCl <10 ml/min since patient is in AKI.    Height: 5\' 2"  (157.5 cm) Weight: 138 lb 14.4 oz (63 kg) IBW/kg (Calculated) : 50.1  Temp (24hrs), Avg:98.1 F (36.7 C), Min:98 F (36.7 C), Max:98.2 F (36.8 C)   Recent Labs Lab 12/28/2016 1613 12/17/16 0649 12/18/16 0753  WBC 8.6 7.5 7.8  CREATININE 1.77* 1.51* 2.47*    Estimated Creatinine Clearance: 17.2 mL/min (by C-G formula based on SCr of 2.47 mg/dL (H)).    No Known Allergies  Antimicrobials this admission: Levaquin 2/9 >> 2/10 Vancomycin, Zosyn 2/10 >>   Dose adjustments this admission:   Microbiology results:  BCx:   UCx:    Sputum:   2/8 MRSA PCR: (+)    2/10 CXR: multifocal pneumonia  Thank you for allowing pharmacy to be a part of this patient's care.  Zohan Shiflet D 12/18/2016 11:49 AM

## 2016-12-18 NOTE — Progress Notes (Signed)
Pt with vomiting overnight and hypoxia, CXR with worsening disease, likely aspirated, abx coverage broadened.  Kristeen MissWILLIS, Rett Stehlik FIELDING [facility] 12/18/2016, 12:32 AM

## 2016-12-18 NOTE — Progress Notes (Signed)
Pt is having trouble communicating and following commands, she was able to give consent for her sons to answer the screening questions for her to have the mri tonight.

## 2016-12-18 NOTE — Progress Notes (Signed)
[facility] - [place] at [facility]   PATIENT NAME: Chloe Sanchez    MR#:  [id number]  DATE OF BIRTH:  Jul 21, 1941  SUBJECTIVE:  CHIEF COMPLAINT:   Chief Complaint  Patient presents with  . Chest Pain   The patient was found seizure-like activity last night. Also, she had some vomiting and shortness of breath, she is lethargic and hypoxia at 85%. She is put on oxygen by nasal cannula 4 L. Chest x-ray show bilateral multifocal pneumonia. The patient is awake and alert now. REVIEW OF SYSTEMS:  Review of Systems  Constitutional: Positive for malaise/fatigue. Negative for chills and fever.  HENT: Negative for congestion.   Eyes: Negative for blurred vision and double vision.  Respiratory: Positive for cough and shortness of breath. Negative for hemoptysis, sputum production, wheezing and stridor.   Cardiovascular: Negative for chest pain and leg swelling.  Gastrointestinal: Negative for abdominal pain, blood in stool, constipation, melena, nausea and vomiting.  Genitourinary: Negative for hematuria and urgency.  Musculoskeletal: Negative for joint pain.  Skin: Negative for itching and rash.  Neurological: Positive for weakness. Negative for dizziness, focal weakness, loss of consciousness and headaches.  Psychiatric/Behavioral: Negative for depression. The patient is not nervous/anxious.     DRUG ALLERGIES:  No Known Allergies VITALS:  Blood pressure (!) 156/72, pulse (!) 110, temperature 98 F (36.7 C), temperature source Oral, resp. rate 17, height 5\' 2"  (1.575 m), weight 138 lb 14.4 oz (63 kg), SpO2 94 %. PHYSICAL EXAMINATION:  Physical Exam  Constitutional: She is oriented to person, place, and time and well-developed, well-nourished, and in no distress.  HENT:  Head: Normocephalic.  Mouth/Throat: Oropharynx is clear and moist.  Eyes: Conjunctivae and EOM are normal.  Neck: Normal range of motion. Neck supple. No JVD present. No tracheal deviation present.    Cardiovascular: Normal rate and regular rhythm.  Exam reveals no gallop.   Murmur heard. Pulmonary/Chest: Effort normal and breath sounds normal. No respiratory distress. She has no wheezes. She has no rales.  Abdominal: Soft. Bowel sounds are normal. She exhibits no distension. There is no tenderness.  Musculoskeletal: Normal range of motion. She exhibits no edema or tenderness.  Neurological: She is alert and oriented to person, place, and time. No cranial nerve deficit.  Skin: No rash noted. No erythema.  Psychiatric: Affect normal.   LABORATORY PANEL:   CBC  Recent Labs Lab 12/18/16 0753  WBC 7.8  HGB 7.8*  HCT 23.1*  PLT 258   ------------------------------------------------------------------------------------------------------------------ Chemistries   Recent Labs Lab 12/18/16 0753  NA 136  K 4.4  CL 106  CO2 24  GLUCOSE 103*  BUN 33*  CREATININE 2.47*  CALCIUM 8.5*   RADIOLOGY:  Dg Chest Port 1 View  Result Date: 12/18/2016 CLINICAL DATA:  Cough. EXAM: PORTABLE CHEST 1 VIEW COMPARISON:  12/23/2016 FINDINGS: Borderline heart size with normal pulmonary vascularity. Patchy airspace infiltrates are demonstrated in the right upper lung and left perihilar region as well as in the lung bases. There is progression since previous study. This likely represents multifocal pneumonia. Edema less likely. No blunting of costophrenic angles. No pneumothorax. Tortuous aorta. Degenerative changes in the spine. IMPRESSION: Increasing airspace disease in both lungs suggesting multifocal pneumonia. Electronically Signed   By: Burman Nieves M.D.   On: 12/18/2016 00:23   ASSESSMENT AND PLAN:   * Pneumonia   She was treated with  Rocephin plus azithromycin. Changed to vancomycin and Zosyn due to worsening multifocal pneumonia.   Encourage  use of incentive spirometer  * Acute respiratory failure with hypoxia due to pneumonia. Continue oxygen by nasal cannula. NEB prn.  * Acute  on chronic renal failure,    Creatinine is higher than baseline creatinine which was 1 year ago.    improving with IVF.  resume IV fluid due to worsening renal function. Since the patient has history of CHF, I will get a echocardiograph.  * COPD   No exacerbation, continue inhalers and Spiriva.  * Hypertension   Continue home medication. Stable.  * Diabetes   Keep on insulin sliding scale coverage.  * History of congestive heart failure   get echocardiogram.  Currently she is not in fluid overload.  * Hypothyroidism   Continue levothyroxine.  Anemia of chronic disease. Hemoglobin decreased to 7.8. Follow-up hemoglobin.  Seizure-like activity and acute encephalopathy. Follow-up MRI of the brain to rule out CVA. Neuro check. Check lipid panel, continue aspirin and add Lipitor. PT evaluation.  All the records are reviewed and case discussed with Care Management/Social Worker. Management plans discussed with the patient, her son and they are in agreement.  CODE STATUS: Limited code (DNI)  TOTAL TIME TAKING CARE OF THIS PATIENT: 37 minutes.   More than 50% of the time was spent in counseling/coordination of care: YES  POSSIBLE D/C IN 3 DAYS, DEPENDING ON CLINICAL CONDITION.   Shaune Pollackhen, Sanjuan Sawa M.D on 12/18/2016 at 1:31 PM  Between 7am to 6pm - Pager - [phone]  After 6pm go to [email or web address] - Social research officer, governmentpassword EPAS [facility]  [facility] [place] [facility]  Office  [phone]  CC: Primary care physician; Delton PrairieBROOKS, KEATAH B, FNP  Note: This dictation was prepared with Dragon dictation along with smaller phrase technology. Any transcriptional errors that result from this process are unintentional.

## 2016-12-18 NOTE — Significant Event (Signed)
Rapid Response Event Note  Overview: Time Called: 1659 Arrival Time: 1700 Event Type: Neurologic  Initial Focused Assessment: RRT RN arrived in room with Administrative Coordinator RN, patient's RN and Consulting civil engineerCharge RN with patient. Patient was alert and sitting up in bed. Patient was twitching upper extremities frequently. [name] RN reports that patient's RN arrived in patient's room and patient had contracted limbs close to body and twitching had increased in intensity from earlier in shift but patient had remained alert and able to talk to RN throughout whole incident. Patient had started twitching during the night (also had required oxygen placement during the night when had been Room air yesterday) when had not been twitching yesterday. [name] RN had already gotten CBG (91) prior to RRT arrival. [name] MD had ordered MRI earlier in shift but not performed yet due to it being ordered Routine and not STAT. Patient alert and oriented to all but time (wrong month, day, and year). No drift in arms or facial drooping during FAST stroke assessment, able to move all extremities with no drift in legs, grips equal and intake, pupils equal and brisk, no pain/numbness/tingling. Patient SR, HR 60-70, 135/57, MAP 74, 93% on 4 L nasal cannula. [name] creatinine increased today from 1.51 to 2.47. [name] RN stated that she has been voiding and is continent. No liver function or ammonia level from this admission.   Interventions: After second page, Dr. Imogene Burnhen called and ordered a STAT head CT without contrast. RRT RN accompanied patient's RN and patient to CT. Vital signs remained stable (116/81, MAP 93, HR 60's SR, 94% on 4 L nasal cannula) and patient's mental status remained stable. Patient discovered to have had incontinent void she was not aware of while at CT.  Plan of Care (if not transferred): Patient to stay on 1C. [name] RN and patient informed to call if patient's condition worsens (including  formal CT results showing further intervention needed.  Event Summary: Name of Physician Notified: Dr. Imogene Burnhen at 1645 (re paged at 1710)    at    Outcome: Stayed in room and stabalized  Event End Time: 1740  Christell ConstantMOORE, [facility] EyeARAH [place]IZABETH

## 2016-12-18 NOTE — Progress Notes (Signed)
Advanced Care Plan.  Purpose of Encounter: CODE STATUS Parties in Attendance: The patient, her son, RN and me. [name] Decisional Capacity: Yes. Subjective/Patient Story: feeling more weak and short of breath on minimal exertion and also has some worsening of her regular coughing for 3-4 days. Objective/Medical Story: Chloe Sanchez  is a 76 y.o. female with a known history of Arthritis, CHF, CKD, depression, diabetes, hypertension. She is admitted for pneumonia, started Zithromax and Rocephin IV. The patient was found seizure-like activity last night. Also, she had some vomiting and shortness of breath, she is lethargic and hypoxia at 85%. She is put on oxygen by nasal cannula 4 L. Chest x-ray show bilateral multifocal pneumonia. Now she has acute respiratory failure with hypoxia due to pneumonia, acute renal failure on CKD. I discussed the CODE STATUS with the patient and her son. The patient does still want CPR but no intubation.  Goals of Care Determinations: Continue current treatment and CODE STATUS.  Plan:  Code Status: DNI.  Time spent discussing advance care planning: 18 minutes.

## 2016-12-18 NOTE — Progress Notes (Addendum)
Pharmacy Antibiotic Note  Chloe Sanchez is a 76 y.o. female admitted on 12/12/2016 with pneumonia.  Pharmacy has been consulted for vancomycin and Zosyn dosing.  Plan: DW 63kg  Vd 44L kei 0.028 hr-1  T1/2 25 hours Vancomycin 1 gram q 36 hours ordered with stacked dosing. Level before 4th dose. Goal trough 15-20.  Zosyn 3.375 grams q 8 hours EI ordered.   Height: 5\' 2"  (157.5 cm) Weight: 138 lb 14.4 oz (63 kg) IBW/kg (Calculated) : 50.1  Temp (24hrs), Avg:98.1 F (36.7 C), Min:97.8 F (36.6 C), Max:98.2 F (36.8 C)   Recent Labs Lab 12/12/2016 1613 12/17/16 0649  WBC 8.6 7.5  CREATININE 1.77* 1.51*    Estimated Creatinine Clearance: 28.1 mL/min (by C-G formula based on SCr of 1.51 mg/dL (H)).    No Known Allergies  Antimicrobials this admission: Levaquin 2/9 >> 2/10 Vancomycin, Zosyn 2/10 >>   Dose adjustments this admission:   Microbiology results:  BCx:   UCx:    Sputum:   2/8 MRSA PCR: (+)    2/10 CXR: multifocal pneumonia  Thank you for allowing pharmacy to be a part of this patient's care.  Chloe Sanchez S 12/18/2016 12:56 AM

## 2016-12-18 NOTE — Progress Notes (Signed)
Found pt in a state of tremors with arms hands drawn.  She was very lethargic with only one eye opening to acknowledge my presence.  Vitals showed BP WDL, HR 30's - 70's varying, O2 sats in the upper 80's.  Placed pt on 4 L of O2.  MD notified.  Dr. Anne HahnWillis came to see pt, ordered a stat chest xray, telemetry monitor and IV vanc and zosyn after xray confirmed worsening or pNA.

## 2016-12-19 ENCOUNTER — Inpatient Hospital Stay: Payer: Medicare HMO

## 2016-12-19 ENCOUNTER — Inpatient Hospital Stay (HOSPITAL_COMMUNITY)
Admit: 2016-12-19 | Discharge: 2016-12-19 | Disposition: A | Discharge status: Home / Self Care | Payer: Medicare HMO | Attending: Internal Medicine | Admitting: Internal Medicine

## 2016-12-19 ENCOUNTER — Encounter: Admission: EM | Disposition: E | Payer: Self-pay | Source: Home / Self Care | Attending: Pulmonary Disease

## 2016-12-19 DIAGNOSIS — I959 Hypotension, unspecified: Secondary | ICD-10-CM

## 2016-12-19 DIAGNOSIS — J449 Chronic obstructive pulmonary disease, unspecified: Secondary | ICD-10-CM

## 2016-12-19 DIAGNOSIS — R059 Cough, unspecified: Secondary | ICD-10-CM

## 2016-12-19 DIAGNOSIS — J441 Chronic obstructive pulmonary disease with (acute) exacerbation: Secondary | ICD-10-CM

## 2016-12-19 DIAGNOSIS — I442 Atrioventricular block, complete: Secondary | ICD-10-CM

## 2016-12-19 DIAGNOSIS — R531 Weakness: Secondary | ICD-10-CM

## 2016-12-19 DIAGNOSIS — R0600 Dyspnea, unspecified: Secondary | ICD-10-CM

## 2016-12-19 DIAGNOSIS — J90 Pleural effusion, not elsewhere classified: Secondary | ICD-10-CM

## 2016-12-19 DIAGNOSIS — N183 Chronic kidney disease, stage 3 unspecified: Secondary | ICD-10-CM

## 2016-12-19 DIAGNOSIS — R0603 Acute respiratory distress: Secondary | ICD-10-CM

## 2016-12-19 DIAGNOSIS — R05 Cough: Secondary | ICD-10-CM

## 2016-12-19 DIAGNOSIS — R001 Bradycardia, unspecified: Secondary | ICD-10-CM

## 2016-12-19 DIAGNOSIS — D631 Anemia in chronic kidney disease: Secondary | ICD-10-CM

## 2016-12-19 DIAGNOSIS — N179 Acute kidney failure, unspecified: Secondary | ICD-10-CM

## 2016-12-19 DIAGNOSIS — N189 Chronic kidney disease, unspecified: Secondary | ICD-10-CM

## 2016-12-19 HISTORY — PX: TEMPORARY PACEMAKER: CATH118268

## 2016-12-19 LAB — PROTEIN / CREATININE RATIO, URINE
CREATININE, URINE: 149 mg/dL
Protein Creatinine Ratio: 0.89 mg/mg{Cre} — ABNORMAL HIGH (ref 0.00–0.15)
TOTAL PROTEIN, URINE: 133 mg/dL

## 2016-12-19 LAB — BLOOD GAS, ARTERIAL
ACID-BASE DEFICIT: 15.6 mmol/L — AB (ref 0.0–2.0)
Acid-base deficit: 9.3 mmol/L — ABNORMAL HIGH (ref 0.0–2.0)
Bicarbonate: 14.9 mmol/L — ABNORMAL LOW (ref 20.0–28.0)
Bicarbonate: 19 mmol/L — ABNORMAL LOW (ref 20.0–28.0)
FIO2: 0.7
FIO2: 1
LHR: 14 {breaths}/min
O2 SAT: 99.5 %
O2 Saturation: 98 %
PCO2 ART: 59 mmHg — AB (ref 32.0–48.0)
PCO2 ART: 52 mmHg — AB (ref 32.0–48.0)
PEEP: 8 cmH2O
PEEP: 8 cmH2O
PO2 ART: 225 mmHg — AB (ref 83.0–108.0)
Patient temperature: 37
Patient temperature: 37
RATE: 14 resp/min
VT: 450 mL
VT: 450 mL
pH, Arterial: 7.01 — CL (ref 7.350–7.450)
pH, Arterial: 7.17 — CL (ref 7.350–7.450)
pO2, Arterial: 128 mmHg — ABNORMAL HIGH (ref 83.0–108.0)

## 2016-12-19 LAB — CBC
HCT: 23.2 % — ABNORMAL LOW (ref 35.0–47.0)
HEMATOCRIT: 24.9 % — AB (ref 35.0–47.0)
HEMOGLOBIN: 8.2 g/dL — AB (ref 12.0–16.0)
Hemoglobin: 7.4 g/dL — ABNORMAL LOW (ref 12.0–16.0)
MCH: 29.5 pg (ref 26.0–34.0)
MCH: 29.1 pg (ref 26.0–34.0)
MCHC: 32.9 g/dL (ref 32.0–36.0)
MCHC: 31.8 g/dL — ABNORMAL LOW (ref 32.0–36.0)
MCV: 89.7 fL (ref 80.0–100.0)
MCV: 91.7 fL (ref 80.0–100.0)
PLATELETS: 312 10*3/uL (ref 150–440)
Platelets: 288 10*3/uL (ref 150–440)
RBC: 2.77 MIL/uL — AB (ref 3.80–5.20)
RBC: 2.53 MIL/uL — AB (ref 3.80–5.20)
RDW: 14.9 % — ABNORMAL HIGH (ref 11.5–14.5)
RDW: 15 % — AB (ref 11.5–14.5)
WBC: 11.9 10*3/uL — ABNORMAL HIGH (ref 3.6–11.0)
WBC: 19 10*3/uL — AB (ref 3.6–11.0)

## 2016-12-19 LAB — URINALYSIS, COMPLETE (UACMP) WITH MICROSCOPIC
BILIRUBIN URINE: NEGATIVE
Glucose, UA: NEGATIVE mg/dL
Hgb urine dipstick: NEGATIVE
Ketones, ur: NEGATIVE mg/dL
NITRITE: NEGATIVE
PH: 5 (ref 5.0–8.0)
Protein, ur: 100 mg/dL — AB
SPECIFIC GRAVITY, URINE: 1.017 (ref 1.005–1.030)

## 2016-12-19 LAB — BASIC METABOLIC PANEL
ANION GAP: 9 (ref 5–15)
Anion gap: 11 (ref 5–15)
BUN: 38 mg/dL — ABNORMAL HIGH (ref 6–20)
BUN: 52 mg/dL — ABNORMAL HIGH (ref 6–20)
CALCIUM: 8.6 mg/dL — AB (ref 8.9–10.3)
CHLORIDE: 107 mmol/L (ref 101–111)
CO2: 20 mmol/L — AB (ref 22–32)
CO2: 18 mmol/L — AB (ref 22–32)
CREATININE: 3.44 mg/dL — AB (ref 0.44–1.00)
Calcium: 8.9 mg/dL (ref 8.9–10.3)
Chloride: 107 mmol/L (ref 101–111)
Creatinine, Ser: 2.65 mg/dL — ABNORMAL HIGH (ref 0.44–1.00)
GFR calc non Af Amer: 12 mL/min — ABNORMAL LOW (ref >60)
GFR, EST AFRICAN AMERICAN: 19 mL/min — AB (ref >60)
GFR, EST AFRICAN AMERICAN: 14 mL/min — AB (ref >60)
GFR, EST NON AFRICAN AMERICAN: 17 mL/min — AB (ref >60)
Glucose, Bld: 114 mg/dL — ABNORMAL HIGH (ref 65–99)
Glucose, Bld: 246 mg/dL — ABNORMAL HIGH (ref 65–99)
POTASSIUM: 5 mmol/L (ref 3.5–5.1)
POTASSIUM: 4.1 mmol/L (ref 3.5–5.1)
Sodium: 136 mmol/L (ref 135–145)
Sodium: 136 mmol/L (ref 135–145)

## 2016-12-19 LAB — LIPID PANEL
CHOLESTEROL: 108 mg/dL (ref 0–200)
HDL: 43 mg/dL (ref >40)
LDL Cholesterol: 50 mg/dL (ref 0–99)
Total CHOL/HDL Ratio: 2.5 RATIO
Triglycerides: 74 mg/dL (ref <150)
VLDL: 15 mg/dL (ref 0–40)

## 2016-12-19 LAB — VANCOMYCIN, RANDOM: Vancomycin Rm: 9

## 2016-12-19 LAB — TROPONIN I
TROPONIN I: 0.32 ng/mL — AB (ref <0.03)
Troponin I: 0.2 ng/mL (ref <0.03)
Troponin I: 0.17 ng/mL (ref <0.03)

## 2016-12-19 LAB — GLUCOSE, CAPILLARY
Glucose-Capillary: 96 mg/dL (ref 65–99)
Glucose-Capillary: 161 mg/dL — ABNORMAL HIGH (ref 65–99)
Glucose-Capillary: 252 mg/dL — ABNORMAL HIGH (ref 65–99)

## 2016-12-19 LAB — MAGNESIUM: Magnesium: 1.7 mg/dL (ref 1.7–2.4)

## 2016-12-19 LAB — FIBRINOGEN: FIBRINOGEN: 495 mg/dL — AB (ref 210–475)

## 2016-12-19 LAB — PROTIME-INR
INR: 1.58
PROTHROMBIN TIME: 19 s — AB (ref 11.4–15.2)

## 2016-12-19 LAB — PROCALCITONIN: PROCALCITONIN: 2.34 ng/mL

## 2016-12-19 LAB — ECHOCARDIOGRAM COMPLETE
HEIGHTINCHES: 62 in
Weight: 2222.4 oz

## 2016-12-19 LAB — LACTIC ACID, PLASMA: LACTIC ACID, VENOUS: 3.9 mmol/L — AB (ref 0.5–1.9)

## 2016-12-19 SURGERY — TEMPORARY PACEMAKER
Anesthesia: Moderate Sedation

## 2016-12-19 MED ORDER — FERROUS SULFATE 220 (44 FE) MG/5ML PO ELIX
300.0000 mg | ORAL_SOLUTION | Freq: Every day | ORAL | Status: DC
  Filled 2016-12-19: qty 6.9

## 2016-12-19 MED ORDER — ROCURONIUM BROMIDE 50 MG/5ML IV SOLN
INTRAVENOUS | Status: AC
  Administered 2016-12-19: 60 mg
  Filled 2016-12-19: qty 1

## 2016-12-19 MED ORDER — MIDAZOLAM HCL 2 MG/2ML IJ SOLN: 1.0000 mg | INTRAMUSCULAR | Status: DC | PRN

## 2016-12-19 MED ORDER — INSULIN ASPART 100 UNIT/ML ~~LOC~~ SOLN: 0.0000 [IU] | SUBCUTANEOUS | Status: DC

## 2016-12-19 MED ORDER — METHADONE HCL 10 MG PO TABS: 10.0000 mg | ORAL_TABLET | Freq: Two times a day (BID) | ORAL | Status: DC

## 2016-12-19 MED ORDER — METHYLPREDNISOLONE SODIUM SUCC 125 MG IJ SOLR
60.0000 mg | Freq: Four times a day (QID) | INTRAMUSCULAR | Status: DC
  Administered 2016-12-19: 60 mg via INTRAVENOUS
  Filled 2016-12-19: qty 2

## 2016-12-19 MED ORDER — INSULIN ASPART 100 UNIT/ML ~~LOC~~ SOLN
0.0000 [IU] | SUBCUTANEOUS | Status: DC
  Administered 2016-12-19: 5 [IU] via SUBCUTANEOUS
  Filled 2016-12-19 (×2): qty 15
  Filled 2016-12-19: qty 5

## 2016-12-19 MED ORDER — EPINEPHRINE PF 1 MG/10ML IJ SOSY
PREFILLED_SYRINGE | INTRAMUSCULAR | Status: AC
  Administered 2016-12-19: 0.5 mg
  Filled 2016-12-19: qty 20

## 2016-12-19 MED ORDER — MIDAZOLAM HCL 2 MG/2ML IJ SOLN
INTRAMUSCULAR | Status: AC
  Administered 2016-12-19: 2 mg via INTRAVENOUS
  Filled 2016-12-19: qty 2

## 2016-12-19 MED ORDER — SODIUM CHLORIDE 0.9 % IV SOLN: Freq: Once | INTRAVENOUS | Status: DC

## 2016-12-19 MED ORDER — DEXTROSE 5 % IV SOLN
0.5000 ug/min | INTRAVENOUS | Status: DC
  Administered 2016-12-19: 0.5 ug/min via INTRAVENOUS
  Administered 2016-12-19: 20 ug/min via INTRAVENOUS
  Administered 2016-12-20: 15 ug/min via INTRAVENOUS
  Filled 2016-12-19 (×4): qty 4

## 2016-12-19 MED ORDER — SODIUM BICARBONATE 8.4 % IV SOLN
150.0000 meq | Freq: Once | INTRAVENOUS | Status: AC
Start: 2016-12-19 — End: 2016-12-19
  Administered 2016-12-19: 150 meq via INTRAVENOUS

## 2016-12-19 MED ORDER — MIDAZOLAM HCL 2 MG/2ML IJ SOLN
2.0000 mg | Freq: Once | INTRAMUSCULAR | Status: AC
  Administered 2016-12-19: 2 mg via INTRAVENOUS

## 2016-12-19 MED ORDER — DEXTROSE 5 % IV SOLN
2.0000 g | INTRAVENOUS | Status: DC
  Administered 2016-12-19 – 2016-12-20 (×2): 2 g via INTRAVENOUS
  Filled 2016-12-19 (×3): qty 2

## 2016-12-19 MED ORDER — NOREPINEPHRINE BITARTRATE 1 MG/ML IV SOLN
0.0000 ug/min | INTRAVENOUS | Status: DC
  Administered 2016-12-19: 8 ug/min via INTRAVENOUS
  Administered 2016-12-19: 40 ug/min via INTRAVENOUS
  Filled 2016-12-19 (×3): qty 4

## 2016-12-19 MED ORDER — FENTANYL CITRATE (PF) 100 MCG/2ML IJ SOLN
50.0000 ug | INTRAMUSCULAR | Status: DC | PRN
  Administered 2016-12-20: 50 ug via INTRAVENOUS
  Filled 2016-12-19: qty 2

## 2016-12-19 MED ORDER — FENTANYL CITRATE (PF) 100 MCG/2ML IJ SOLN
50.0000 ug | INTRAMUSCULAR | Status: DC | PRN
  Filled 2016-12-19: qty 2

## 2016-12-19 MED ORDER — DOPAMINE-DEXTROSE 3.2-5 MG/ML-% IV SOLN
5.0000 ug/kg/min | INTRAVENOUS | Status: DC
  Administered 2016-12-19 (×2): 5 ug/kg/min via INTRAVENOUS
  Filled 2016-12-19 (×2): qty 250

## 2016-12-19 MED ORDER — FENTANYL CITRATE (PF) 100 MCG/2ML IJ SOLN
INTRAMUSCULAR | Status: AC
  Filled 2016-12-19: qty 2

## 2016-12-19 MED ORDER — BISACODYL 10 MG RE SUPP: 10.0000 mg | Freq: Every day | RECTAL | Status: DC | PRN

## 2016-12-19 MED ORDER — ACETAMINOPHEN 325 MG PO TABS: 650.0000 mg | ORAL_TABLET | Freq: Four times a day (QID) | ORAL | Status: DC | PRN

## 2016-12-19 MED ORDER — SODIUM BICARBONATE 8.4 % IV SOLN
INTRAVENOUS | Status: DC
  Administered 2016-12-19: 22:00:00 via INTRAVENOUS
  Filled 2016-12-19 (×2): qty 150

## 2016-12-19 MED ORDER — LEVOTHYROXINE SODIUM 100 MCG IV SOLR
50.0000 ug | Freq: Every day | INTRAVENOUS | Status: DC
  Administered 2016-12-20 – 2016-12-28 (×9): 50 ug via INTRAVENOUS
  Filled 2016-12-19 (×9): qty 5

## 2016-12-19 MED ORDER — FUROSEMIDE 10 MG/ML IJ SOLN
20.0000 mg | Freq: Once | INTRAMUSCULAR | Status: DC
  Filled 2016-12-19: qty 2

## 2016-12-19 MED ORDER — ASPIRIN 325 MG PO TABS
325.0000 mg | ORAL_TABLET | Freq: Every day | ORAL | Status: DC
  Administered 2016-12-19: 325 mg via ORAL
  Filled 2016-12-19: qty 1

## 2016-12-19 MED ORDER — FENTANYL CITRATE (PF) 100 MCG/2ML IJ SOLN
50.0000 ug | Freq: Once | INTRAMUSCULAR | Status: AC
  Administered 2016-12-19: 50 ug via INTRAVENOUS

## 2016-12-19 MED ORDER — METHADONE HCL 5 MG PO TABS: 20.0000 mg | ORAL_TABLET | Freq: Every day | ORAL | Status: DC

## 2016-12-19 MED ORDER — DEXTROSE 5 % IV SOLN
0.0000 ug/min | INTRAVENOUS | Status: DC
  Administered 2016-12-19: 40 ug/min via INTRAVENOUS
  Administered 2016-12-20: 10 ug/min via INTRAVENOUS
  Administered 2016-12-20: 80 ug/min via INTRAVENOUS
  Filled 2016-12-19 (×3): qty 16

## 2016-12-19 MED ORDER — POLYETHYLENE GLYCOL 3350 17 G PO PACK
17.0000 g | PACK | Freq: Every day | ORAL | Status: DC | PRN
  Administered 2016-12-21: 17 g via NASOGASTRIC
  Filled 2016-12-19: qty 1

## 2016-12-19 MED ORDER — FUROSEMIDE 10 MG/ML IJ SOLN
20.0000 mg | Freq: Two times a day (BID) | INTRAMUSCULAR | Status: DC
  Administered 2016-12-19: 20 mg via INTRAVENOUS
  Filled 2016-12-19: qty 2

## 2016-12-19 MED ORDER — FUROSEMIDE 10 MG/ML IJ SOLN: 40.0000 mg | Freq: Four times a day (QID) | INTRAMUSCULAR | Status: DC

## 2016-12-19 MED ORDER — FUROSEMIDE 10 MG/ML IJ SOLN
20.0000 mg | Freq: Four times a day (QID) | INTRAMUSCULAR | Status: DC
Start: 2016-12-19 — End: 2016-12-19

## 2016-12-19 MED ORDER — FUROSEMIDE 10 MG/ML IJ SOLN
40.0000 mg | Freq: Four times a day (QID) | INTRAMUSCULAR | Status: DC
  Filled 2016-12-19: qty 4

## 2016-12-19 MED ORDER — VASOPRESSIN 20 UNIT/ML IV SOLN
0.0300 [IU]/min | INTRAVENOUS | Status: DC
  Administered 2016-12-19: 0.03 [IU]/min via INTRAVENOUS
  Filled 2016-12-19: qty 2

## 2016-12-19 MED ORDER — SODIUM BICARBONATE 8.4 % IV SOLN
INTRAVENOUS | Status: AC
  Filled 2016-12-19: qty 50

## 2016-12-19 MED ORDER — EPINEPHRINE PF 1 MG/ML IJ SOLN
INTRAMUSCULAR | Status: DC | PRN
  Administered 2016-12-19 (×2): 1 mg via INTRAVENOUS

## 2016-12-19 MED ORDER — VANCOMYCIN HCL IN DEXTROSE 1-5 GM/200ML-% IV SOLN
1000.0000 mg | INTRAVENOUS | Status: DC
  Administered 2016-12-19 (×2): 1000 mg via INTRAVENOUS
  Filled 2016-12-19: qty 200

## 2016-12-19 MED ORDER — KETAMINE HCL 10 MG/ML IJ SOLN
2.0000 mg/kg | Freq: Once | INTRAMUSCULAR | Status: AC
  Administered 2016-12-19: 60 mg via INTRAVENOUS

## 2016-12-19 MED ORDER — ASPIRIN 325 MG PO TABS
325.0000 mg | ORAL_TABLET | Freq: Every day | ORAL | Status: DC
  Administered 2016-12-20 – 2016-12-21 (×2): 325 mg via NASOGASTRIC
  Filled 2016-12-19 (×2): qty 1

## 2016-12-19 MED ORDER — METHYLPREDNISOLONE SODIUM SUCC 40 MG IJ SOLR
40.0000 mg | Freq: Three times a day (TID) | INTRAMUSCULAR | Status: DC
  Administered 2016-12-19 – 2016-12-20 (×3): 40 mg via INTRAVENOUS
  Filled 2016-12-19 (×3): qty 1

## 2016-12-19 MED ORDER — STERILE WATER FOR INJECTION IJ SOLN
INTRAMUSCULAR | Status: AC
  Administered 2016-12-19: 10 mL
  Filled 2016-12-19: qty 10

## 2016-12-19 MED ORDER — PANTOPRAZOLE SODIUM 40 MG IV SOLR
40.0000 mg | INTRAVENOUS | Status: DC
  Administered 2016-12-19: 40 mg via INTRAVENOUS
  Filled 2016-12-19: qty 40

## 2016-12-19 SURGICAL SUPPLY — 6 items
CABLE ADAPT CONN TEMP 6FT (ADAPTER) ×3 IMPLANT
NEEDLE PERC 18GX7CM (NEEDLE) ×3 IMPLANT
PACK CARDIAC CATH (CUSTOM PROCEDURE TRAY) ×3 IMPLANT
SHEATH AVANTI 6FR X 11CM (SHEATH) ×3 IMPLANT
SLEEVE REPOSITIONING LENGTH 30 (MISCELLANEOUS) ×3 IMPLANT
WIRE PACING TEMP ST TIP 5 (CATHETERS) ×3 IMPLANT

## 2016-12-19 NOTE — Progress Notes (Signed)
Pt only responses for few seconds and returns to sleep. Family at bedside. Pt given meds in applesauce with aspiration precautions observed with pt needing cueing.Color pale. Respirations labored with mild audible wheeze at rest without acute distress. EKG done with Dr. Imogene Burnhen paged written report. Troponin elevated with result paged to Dr. Imogene Burnhen. Neuro checks done.

## 2016-12-19 NOTE — Progress Notes (Signed)
Pharmacy Antibiotic Note  Chloe Sanchez is a 76 y.o. female admitted on 12/18/2016 with pneumonia.  Pharmacy has been consulted for vancomycin and Zosyn dosing.  Plan: Patient serum creatinine increased significantly from 1.5 >> ~2.5 (AKI). Will dose Vancomycin per levels for now. Will order random vancomycin level 36 hours post initial dose. Patient likely will need q48 hour dosing. Will need to reorder Vancomycin upon result of the Random Level   2/11: Random Vancomycin level resulted @ 9. Will continue Vancomycin 1 g IV q36 hours. Will follow renal function closely and order trough level when clinically indicated for patnet.   Zosyn: Will dose as if CrCl <10 ml/min since patient is in AKI.    Height: 5\' 2"  (157.5 cm) Weight: 138 lb 14.4 oz (63 kg) IBW/kg (Calculated) : 50.1  Temp (24hrs), Avg:98.7 F (37.1 C), Min:97.8 F (36.6 C), Max:100.2 F (37.9 C)   Recent Labs Lab 12/17/2016 1613 12/17/16 0649 12/18/16 0753 12/16/2016 0514 12/11/2016 1343  WBC 8.6 7.5 7.8 11.9*  --   CREATININE 1.77* 1.51* 2.47* 2.65*  --   VANCORANDOM  --   --   --   --  9    Estimated Creatinine Clearance: 16 mL/min (by C-G formula based on SCr of 2.65 mg/dL (H)).    No Known Allergies  Antimicrobials this admission: Levaquin 2/9 >> 2/10 Vancomycin, Zosyn 2/10 >>   Dose adjustments this admission:   Microbiology results:  BCx:   UCx:    Sputum:   2/8 MRSA PCR: (+)    2/10 CXR: multifocal pneumonia  Thank you for allowing pharmacy to be a part of this patient's care.  Jayro Mcmath D 12/18/2016 2:55 PM

## 2016-12-19 NOTE — ED Provider Notes (Signed)
 -----------------------------------------   4:33 PM on 01/01/2017 -----------------------------------------  Received call from cardiology Dr. Rockey Situ to the ED requesting assistance with airway management in the ICU. On checking with house supervisor, it appears that there are no critical care medicine or anesthesiology providers available at the present time to manage this emergent situation.  On arrival to bedside, Dr. 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 The family had previously stated that the patient would not want to be intubated but on discussing the findings today and the potential reversibility and acute illness Dr. Rockey Situ reports they have provided verbal phone consent to proceed with intubation.  On my arrival, heart rate is 30, blood pressure is 60/30. Dr. Rockey Situ is providing ACLS care. Patient is not maintaining her airway and is hypoxic. Unable to obtain a good waveform on pulse oximetry due to poor peripheral perfusion.  After pre-oxygenating the patient for at least 10 minutes with 100% FiO2 via bag valve mask with 2 hand seal on the face with patient and position with ear to sternal notch and chin lift, I proceeded with intubation due to respiratory failure and critical illness in emergency situation.  INTUBATION Performed by: Daje Stark Immediately prior to infusion of paralytic and sedative and intubation, heart rate is about 75, blood pressure about 100/50 after IV epinephrine.  Required items: required blood products, implants, devices, and special equipment available Patient identity confirmed: provided demographic data and hospital-assigned identification number Time out: Immediately prior to procedure a "time out" was called to verify the correct patient, procedure, equipment, support staff and  site/side marked as required.  Indications: Respiratory failure, critical illness   Intubation method: Direct laryngoscopy, Mac 3 Preoxygenation: BVM  RSI meds given at 4:17 PM Sedatives: Ketamine 60 mg  Paralytic: Rocuronium 60 mg   Intubation completed at 4:19 PM, successful tube placement on 1 attempt Tube Size: 7.5 cuffed  Post-procedure assessment: chest rise and ETCO2 monitor Breath sounds: equal and absent over the epigastrium Tube secured with: ETT holder  Patient tolerated the procedure well with no immediate complications. immediately afterward blood pressure was stable at 100/50, heart rate 75.  Ongoing critical care by critical care medicine and Dr. Rockey Situ.     Carrie Mew, MD 12/21/2016 1640

## 2016-12-19 NOTE — Progress Notes (Signed)
ANTIBIOTIC CONSULT NOTE - INITIAL  Pharmacy Consult for Cefepime Indication: HCAP  No Known Allergies  Patient Measurements: Height: 5\' 2"  (157.5 cm) Weight: 138 lb 14.4 oz (63 kg) IBW/kg (Calculated) : 50.1 Adjusted Body Weight:   Vital Signs: Temp: 98.4 F (36.9 C) (02/11 1600) Temp Source: Axillary (02/11 1600) BP: 96/43 (02/11 1930) Pulse Rate: 70 (02/11 1930) Intake/Output from previous day: 02/10 0701 - 02/11 0700 In: 725 [I.V.:675; IV Piggyback:50] Out: 1 [Urine:1] Intake/Output from this shift: Total I/O In: 200 [IV Piggyback:200] Out: 40 [Urine:40]  Labs:  Recent Labs  12/17/16 0649 12/18/16 0753 2017/03/07 0514 2017/03/07 1350 2017/03/07 1819  WBC 7.5 7.8 11.9*  --   --   HGB 8.5* 7.8* 8.2*  --   --   PLT 274 258 288  --   --   LABCREA  --   --   --  149  --   CREATININE 1.51* 2.47* 2.65*  --  3.44*   Estimated Creatinine Clearance: 12.3 mL/min (by C-G formula based on SCr of 3.44 mg/dL (H)).  Recent Labs  2017/03/07 1343  VANCORANDOM 9     Microbiology: Recent Results (from the past 720 hour(s))  MRSA PCR Screening     Status: Abnormal   Collection Time: 12/25/2016 10:51 PM  Result Value Ref Range Status   MRSA by PCR POSITIVE (A) NEGATIVE Final    Comment:        The GeneXpert MRSA Assay (FDA approved for NASAL specimens only), is one component of a comprehensive MRSA colonization surveillance program. It is not intended to diagnose MRSA infection nor to guide or monitor treatment for MRSA infections. RESULT CALLED TO, READ BACK BY AND VERIFIED WITH: CYNTHIA BURGESS @ 0021 ON 12/17/2016 BY CAF     Medical History: Past Medical History:  Diagnosis Date  . Anemia   . Anxiety   . Arthritis   . CHF (congestive heart failure) (HCC)   . Chronic kidney disease    CKD  . Depression   . Diabetes mellitus without complication (HCC)   . Gout   . Hypertension   . Hypothyroidism   . On supplemental oxygen therapy    AT NIGHT  . Polyneuropathy  (HCC)   . RLS (restless legs syndrome)   . Sjogren's syndrome (HCC)   . Sleep apnea     Medications:  Prescriptions Prior to Admission  Medication Sig Dispense Refill Last Dose  . albuterol (PROVENTIL HFA;VENTOLIN HFA) 108 (90 BASE) MCG/ACT inhaler Inhale 2 puffs into the lungs every 4 (four) hours as needed for wheezing or shortness of breath.   prn at prn  . allopurinol (ZYLOPRIM) 100 MG tablet Take 100 mg by mouth daily.   01/03/2017 at 0800  . amitriptyline (ELAVIL) 50 MG tablet Take 50 mg by mouth at bedtime.   12/15/2016 at 2000  . amLODipine-atorvastatin (CADUET) 5-10 MG per tablet Take 1 tablet by mouth daily.   12/10/2016 at 0800  . aspirin 81 MG tablet Take 81 mg by mouth daily.   01/01/2017 at 0800  . Carboxymethylcellul-Glycerin (OPTIVE) 0.5-0.9 % SOLN Apply 1-2 drops to eye 3 (three) times daily as needed.   prn at prn  . colchicine 0.6 MG tablet Take 0.6 mg by mouth daily. 2 BY MOUTH FIRST SIGN OF GOUT THEN 1 DAILY AS NEEDED   prn at prn  . ergocalciferol (VITAMIN D2) 50000 UNITS capsule Take 50,000 Units by mouth every 30 (thirty) days.   Past Month at Unknown time  .  esomeprazole (NEXIUM) 40 MG capsule Take 40 mg by mouth daily at 12 noon. AM   Dec 30, 2016 at 0800  . ferrous sulfate 325 (65 FE) MG tablet Take 325 mg by mouth daily with breakfast.   12/30/2016 at 0800  . gabapentin (NEURONTIN) 600 MG tablet Take 600 mg by mouth 2 (two) times daily.    December 30, 2016 at 0800  . guaiFENesin-dextromethorphan (ROBITUSSIN DM) 100-10 MG/5ML syrup Take 5 mLs by mouth every 4 (four) hours as needed for cough.   prn at prn  . levothyroxine (SYNTHROID, LEVOTHROID) 88 MCG tablet Take 88 mcg by mouth daily before breakfast.   Dec 30, 2016 at 0730  . methadone (DOLOPHINE) 10 MG tablet Take 1 tablet (10 mg total) by mouth daily with lunch. (Patient taking differently: Take 10 mg by mouth 3 (three) times daily. 1 tablet at 0800, 1 tablet at 1330, 2 tablets at 2100) 30 tablet 0   . polyethylene glycol (MIRALAX /  GLYCOLAX) packet Take 17 g by mouth daily.   prn at prn  . pregabalin (LYRICA) 150 MG capsule Take 1 capsule (150 mg total) by mouth 2 (two) times daily. 30 capsule 0 December 30, 2016 at 0800  . rOPINIRole (REQUIP) 1 MG tablet Take 1 mg by mouth 2 (two) times daily. 0800, 1930   Dec 30, 2016 at 0800  . tiotropium (SPIRIVA) 18 MCG inhalation capsule Place 18 mcg into inhaler and inhale daily.   30-Dec-2016 at 0800  . trolamine salicylate (ASPERCREME) 10 % cream Apply 1 application topically 3 (three) times daily as needed for muscle pain.   prn at prn  . methadone (DOLOPHINE) 10 MG tablet Take 3 tablets (30 mg total) by mouth daily with breakfast. (Patient not taking: Reported on 12/30/16) 30 tablet 0 Not Taking at Unknown time   Assessment: CrCl = 12.3 ml/min   Goal of Therapy:  resolution of infection  Plan:  Expected duration 7 days with resolution of temperature and/or normalization of WBC   Will start Cefepime 2 gm IV Q24H on 2/11 @ 22:00.   [name] D 12/18/2016,9:11 PM

## 2016-12-19 NOTE — Procedures (Signed)
Central Venous Catheter Insertion Procedure Note Teressa Lowerdith O Schexnider [id number] 1941-07-24  Procedure: Insertion of Right internal jugular Central Venous Catheter Indications: Assessment of intravascular volume, Drug and/or fluid administration and Frequent blood sampling  Procedure Details Consent: Unable to obtain consent because of emergent medical necessity. Time Out: Verified patient identification, verified procedure, site/side was marked, verified correct patient position, special equipment/implants available, medications/allergies/relevent history reviewed, required imaging and test results available.  Performed  Maximum sterile technique was used including antiseptics, cap, gloves, gown, hand hygiene, mask and sheet. Skin prep: Chlorhexidine; local anesthetic administered A antimicrobial bonded/coated triple lumen catheter was placed in the right internal jugular vein using the Seldinger technique.  Evaluation Blood flow good Complications: No apparent complications Patient did tolerate procedure well. Chest X-ray ordered to verify placement.  CXR: normal. Procedure performed under direct supervision of Dr.Ramachandran. Ultrasound utilized for realtime vessel cannulation  Magdalene S. [facility] ANP-BC Pulmonary and Critical Care Medicine [facility] Pager [phone] or [phone]  May 08, 2017, 8:54 PM  Billy Fischeravid Arabelle Bollig, MD PCCM service Mobile [phone] Pager [phone] 12/20/2016

## 2016-12-19 NOTE — Progress Notes (Addendum)
[facility] - [place] [facility]   PATIENT NAME: Voncille Lodith Thieme    MR#:  [id number]  DATE OF BIRTH:  17-Jan-1941  SUBJECTIVE:  CHIEF COMPLAINT:   Chief Complaint  Patient presents with  . Chest Pain   The patient was found seizure-like activity last night. Also, she had some vomiting and shortness of breath, she is lethargic and hypoxia at 85%. She is put on oxygen by nasal cannula 4 L. Chest x-ray show bilateral multifocal pneumonia. The patient is awake and alert now. REVIEW OF SYSTEMS:  Review of Systems  Constitutional: Positive for malaise/fatigue. Negative for chills and fever.  HENT: Negative for congestion.   Eyes: Negative for blurred vision and double vision.  Respiratory: Positive for cough and shortness of breath. Negative for hemoptysis, sputum production, wheezing and stridor.   Cardiovascular: Negative for chest pain and leg swelling.  Gastrointestinal: Negative for abdominal pain, blood in stool, constipation, melena, nausea and vomiting.  Genitourinary: Negative for hematuria and urgency.  Musculoskeletal: Negative for joint pain.  Skin: Negative for itching and rash.  Neurological: Positive for weakness. Negative for dizziness, focal weakness, loss of consciousness and headaches.  Psychiatric/Behavioral: Negative for depression. The patient is not nervous/anxious.     DRUG ALLERGIES:  No Known Allergies VITALS:  Blood pressure (!) 106/41, pulse (!) 51, temperature 98.1 F (36.7 C), temperature source Oral, resp. rate (!) 22, height 5\' 2"  (1.575 m), weight 138 lb 14.4 oz (63 kg), SpO2 96 %. PHYSICAL EXAMINATION:  Physical Exam  Constitutional: She is well-developed, well-nourished, and in no distress.  HENT:  Head: Normocephalic.  Mouth/Throat: Oropharynx is clear and moist.  Eyes: Conjunctivae and EOM are normal.  Neck: Normal range of motion. Neck supple. No JVD present. No tracheal deviation present.  Cardiovascular: Normal rate and  regular rhythm.  Exam reveals no gallop.   Murmur heard. Pulmonary/Chest: Effort normal and breath sounds normal. No respiratory distress. She has no wheezes. She has no rales.  Abdominal: Soft. Bowel sounds are normal. She exhibits no distension. There is no tenderness.  Musculoskeletal: Normal range of motion. She exhibits edema. She exhibits no tenderness.  Trace leg edema.  Neurological: She is alert. No cranial nerve deficit.  AAOx2  Skin: No rash noted. No erythema.  Psychiatric:  Lethargic and confused   LABORATORY PANEL:   CBC  Recent Labs Lab 12/30/2016 0514  WBC 11.9*  HGB 8.2*  HCT 24.9*  PLT 288   ------------------------------------------------------------------------------------------------------------------ Chemistries   Recent Labs Lab 12/27/2016 0514  NA 136  K 5.0  CL 107  CO2 20*  GLUCOSE 114*  BUN 38*  CREATININE 2.65*  CALCIUM 8.6*   RADIOLOGY:  Ct Head Wo Contrast  Result Date: 12/18/2016 CLINICAL DATA:  Acute onset of stroke-like symptoms. Difficulty with speech, and possible seizure like activity. Initial encounter. EXAM: CT HEAD WITHOUT CONTRAST TECHNIQUE: Contiguous axial images were obtained from the base of the skull through the vertex without intravenous contrast. COMPARISON:  CT of the head performed 12/08/2016 FINDINGS: Brain: No evidence of acute infarction, hemorrhage, hydrocephalus, extra-axial collection or mass lesion/mass effect. Prominence of the sulci suggests mild cortical volume loss. Mild cerebellar atrophy is noted. Mild periventricular white matter change likely reflects small vessel ischemic microangiopathy. The brainstem and fourth ventricle are within normal limits. The basal ganglia are unremarkable in appearance. The cerebral hemispheres demonstrate grossly normal gray-white differentiation. No mass effect or midline shift is seen. Vascular: No hyperdense vessel or unexpected calcification. Skull: There is  no evidence of fracture;  visualized osseous structures are unremarkable in appearance. Sinuses/Orbits: The visualized portions of the orbits are within normal limits. The paranasal sinuses and mastoid air cells are well-aerated. Other: No significant soft tissue abnormalities are seen. IMPRESSION: 1. No acute intracranial pathology seen on CT. 2. Mild cortical volume loss and scattered small vessel ischemic microangiopathy. Electronically Signed   By: Roanna Raider M.D.   On: 12/18/2016 17:44   Mr Brain Wo Contrast  Result Date: 12/18/2016 CLINICAL DATA:  Episode of seizure like activity. Slurred speech. Tremors. Disorientation. EXAM: MRI HEAD WITHOUT CONTRAST TECHNIQUE: Multiplanar, multiecho pulse sequences of the brain and surrounding structures were obtained without intravenous contrast. COMPARISON:  Head CT same day.  Head CT 12/08/2016. FINDINGS: Brain: Diffusion imaging does not show any acute or subacute infarction. The study does suffer from considerable motion degradation. The brainstem is negative. There are old small vessel infarctions affecting the cerebellum bilaterally. Cerebral hemispheres show moderate chronic small-vessel ischemic changes of the deep white matter. No large vessel territory infarction. No mass lesion, hemorrhage, hydrocephalus or extra-axial collection. No pituitary mass. Vascular: Major vessels at the base of the brain show flow. Skull and upper cervical spine: Negative Sinuses/Orbits: Clear/normal Other: None significant IMPRESSION: No acute finding. 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 Moderate chronic small-vessel ischemic changes of the cerebral hemispheric white matter. No specific cause of seizure identified. These results will be called to the ordering clinician or representative by the Radiologist Assistant, and communication documented in the PACS or zVision Dashboard. Electronically Signed   By: Paulina Fusi M.D.   On: 12/18/2016 20:56   ASSESSMENT AND PLAN:   *  Pneumonia   She was treated with  Rocephin plus azithromycin. Changed to vancomycin and Zosyn due to worsening multifocal pneumonia.  * Acute respiratory failure with hypoxia due to pneumonia and COPD exacerbation. Put on NRB oxygen due to worsening hypoxia. NEB prn. Pulmonary consult. robitussin prn. Lasix iv bid. ABG PO2 41. Start BIPAP.  * Elevated troponin, due to demanding ischemia.  ASA 325 mg daily, f/u cardiology consult and troponin. I discussed with Dr. Mariah Milling.   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 Tele monitor, since BP and HR is stable, no cardiac procedure due to critical conditon per Dr. Mariah Milling.  * Acute on chronic renal failure,    Creatinine is higher than baseline creatinine which was 1 year ago. on IV fluid due to worsening renal function. Since the patient has history of CHF, hold IVF.  f/u echocardiograph. Lasix iv bid.  * COPD exacerbation.    start iv solumedrol, continue inhalers and Spiriva.  * Hypertension   hold norvasc due to low BP.  * Diabetes   Keep on insulin sliding scale coverage.  * History of congestive heart failure   get echocardiogram.   Tele showed 2-3 degree AVB. EKG, cardiology consult.  * Hypothyroidism   Continue levothyroxine.  Anemia of chronic disease. Hemoglobin decreased to 7.8. Follow-up hemoglobin 8.2.  Seizure-like activity and acute encephalopathy. MRI of the brain: no CVA. Neuro check. add Lipitor. F/u neurology consult  Transfer to stepdown.  I discussed with Dr. Mariah Milling.  The patient's condition is declining, high risk for cardiopulmonary arrest and has poor prognosis. I called her son and updated information. All the records are reviewed and case discussed with Care Management/Social Worker. Management plans discussed with the patient, her son and they are in agreement.  CODE STATUS: Limited code (DNI)  TOTAL Critical TIME TAKING CARE OF THIS  PATIENT: 65 minutes.   More than 50% of the time was spent in counseling/coordination  of care: YES  POSSIBLE D/C IN ? DAYS, DEPENDING ON CLINICAL CONDITION.   Shaune Pollack M.D on 12/22/2016 at 8:47 AM  Between 7am to 6pm - Pager - [phone]  After 6pm go to [email or web address] - Social research officer, government  [facility]  Office  [phone]  CC: Primary care physician; Delton Prairie, FNP  Note: This dictation was prepared with Dragon dictation along with smaller phrase technology. Any transcriptional errors that result from this process are unintentional.

## 2016-12-19 NOTE — Progress Notes (Signed)
LE:  At 1606 called to patients room by primary RN asking for help.  Dr. Mariah MillingGollan at bedside.  Pt's heart rate 29-31bpm.   1608:  Dopamine Started at 3120mcg/kg/min IV and pt given 1mg  Epinephrine (1:10,000) given IV push.  1615:  1mg  Epinephrine (1:10,000) given IV push ' Dopamine reduced to 2210mcg/kg/min IV 1618: Pt intubated by Scotty CourtStafford MD, given 60mg  Ketamine, and 60 mg Rocuronium. 1628: Pt given 1mg  epinephrine (1:10,000) given IV push.

## 2016-12-19 NOTE — Progress Notes (Signed)
Contacted by nursing 3:30 pm that patient was having worsening respiratory distress, heart rate 30s, pauses 4 seconds. Poor mentation. Tele reviewed showing CHB, rate 30. Systolic pressure 60 to 88 Previous junctional escape rhythm in the low 50s, systolic pressure earlier 110 for most of the day  Discussed quickly with family and they gave permission to "do anything" They wanted temporary pacemaker, If intubation was needed for short period of time to get over pneumonia, they were willing to accept this  Started on dopamine infusion, mild initial improvement in rate, Discussed case with ED,  Dr Scotty CourtStafford at the bedside to intubate Ketamine and rocuronium used for sedation to avoid hypotension Epi x 1 with repeat 15 min four times with heart rate and BP support, continued on dopamine 10 ug Started on therapy infusion with slow titration upwards  Case discussed in detail with Dr. Kirke CorinArida Even pulmonary hypertension, tricuspid valve regurgitation, felt it more prudent to place temp rewiring the cardiac catheterization lab.  Cardiac catheterization staff called in, Patient escorted down. Dr. Kirke CorinArida place the temporary wire, pacing at 70. Still with moderate hypotension despite dopamine 15 mics, Epi at 4 ug Order placed to start Levophed Discussed with ICU nursing   Total encounter time more than 60 minutes  Greater than 50% was spent in counseling and coordination of care with the patient   Signed, Dossie Arbourim Bryden Darden, MD, Ph.D [facility]

## 2016-12-19 NOTE — Progress Notes (Signed)
Dr. Mariah MillingGollan in and advised pt is in complete heart block and need to tranfer pt asap. Agreed with transfer in progress. [facility]

## 2016-12-19 NOTE — Consult Note (Signed)
[facility] [place] [facility] Consultation     ASSESSMENT/PLAN   76 year old female with acute respiratory failure, with acute aspiration pneumonitis/pneumonia with severe pulmonary edema. 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  PULMONARY A:-Acute aspiration pneumonia/pneumonitis. -Acute pulmonary edema, likely secondary to aortic stenosis. P:   -Continue antibiotic coverage. -Continue Lasix, will increase frequency. -Empiric steroids for aspiration pneumonitis.  CARDIOVASCULAR A: Third degree heart block with junctional escape rhythm. -Critical aortic stenosis. P:  -Discussed with Dr. Mariah Milling, will be following, patient may need temporary pacemaker. -Aortic stenosis, may need to be addressed after acute respiratory issues have improved.  RENAL A acute kidney injury. Suspect prerenal. P:   Continue diuresis.  GASTROINTESTINAL A:  -Nausea, vomiting. -Symptomatically treatment.  HEMATOLOGIC A:  --  INFECTIOUS A:  Aspiration pneumonia/pneumonitis. P:   -As above, continue antibiotics. -Check blood cultures. -MRSA screen positive, will start vancomycin. -Check blood cultures. -Urine cultures.  Micro/culture results:  BCx2 -- UC -- Sputum-- MRSA screen 01/01/2017:  Antibiotics: Azithro 2/8 >>2/9 CTX 2/9 >>2/9 Levofloxacin 2/9>> Vancomycin 2/11>>  ENDOCRINE A:  Hypothyroidism.  gout P:   --Continue thyroid replacement.  --Stop allopurinol, colchicine.   NEUROLOGIC A:  Acute metabolic encephalopathy P:   --   MAJOR EVENTS/TEST RESULTS:   Best Practices  DVT Prophylaxis: heparin  GI Prophylaxis: Protonix   ---------------------------------------  ---------------------------------------   Name: ETOILE LOOMAN MRN: [id number] DOB: 1941-10-08    ADMISSION DATE:  12/24/2016 CONSULTATION DATE:  01/03/2017  REFERRING MD : Dr. Imogene Burn  CHIEF COMPLAINT:  Dyspnea.    HISTORY OF PRESENT  ILLNESS:    76 year old Patient with a known history of Arthritis, CHF, CKD, depression, diabetes, RLS, Sjogren syndrome, hypertension presented to the hospital on 12/27/2016 for left-sided chest pain, diarrhea, fever, as well as generalized weakness which had been progressive over the previous several days. She lives at home and ambulate with a walker, she had been into the ED one week prior after a fall. Chest x-ray at the time of admission, showed scattered bilateral infiltrates, greatest in the right upper lobe, as well as scattered bilateral interstitial changes, these were not seen on previous imaging. The patient was diagnosed with pneumonia at the time of admission, and started on antibiotics Her heart rate has been in the high 40s to low 50s, since admission, this was not seen on previous hospital visit. The patient was initially on room air with O2 sat of 99% on 2/8. On the evening of 2/9, the patient experienced some vomiting and was suspected of having aspiration but by 2/10, antibiotics were broadened to Levaquin. The patient was requiring 4 L, and this increased to 6 L throughout the day,  finally requiring a nonrebreather mask, today.  The patient was seen by cardiology for persistent bradycardia, the case was discussed with Dr. Raynald Kemp, it was noted that the patient has a critical aortic stenosis, as well as a complete heart block with a junctional escape rhythm. It was felt this could be addressed once the patient's acute respiratory issues have been dealt with.  Patient is currently very lethargic, slightly arousable, mumbles, and is occasionally intelligible. She is currently on a nonrebreather oxygen mask.   A stat portable chest x-ray was ordered, and on my evaluation this shows acute worsening of bilateral diffuse infiltrates, now involving most of the bilateral lower lung zones  PAST MEDICAL HISTORY :  Past Medical History:  Diagnosis Date  . Anemia   . Anxiety   .  Arthritis   .  CHF (congestive heart failure) (HCC)   . Chronic kidney disease    CKD  . Depression   . Diabetes mellitus without complication (HCC)   . Gout   . Hypertension   . Hypothyroidism   . On supplemental oxygen therapy    AT NIGHT  . Polyneuropathy (HCC)   . RLS (restless legs syndrome)   . Sjogren's syndrome (HCC)   . Sleep apnea    Past Surgical History:  Procedure Laterality Date  . BACK SURGERY     lumbar  . CHOLECYSTECTOMY    . FOOT SURGERY Bilateral   . TOTAL KNEE ARTHROPLASTY Right 07/17/2015   Procedure: TOTAL KNEE ARTHROPLASTY;  Surgeon: Kennedy Bucker, MD;  Location: [facility] ORS;  Service: Orthopedics;  Laterality: Right;   Prior to Admission medications   Medication Sig Start Date End Date Taking? Authorizing Provider  albuterol (PROVENTIL HFA;VENTOLIN HFA) 108 (90 BASE) MCG/ACT inhaler Inhale 2 puffs into the lungs every 4 (four) hours as needed for wheezing or shortness of breath.   Yes Historical Provider, MD  allopurinol (ZYLOPRIM) 100 MG tablet Take 100 mg by mouth daily.   Yes Historical Provider, MD  amitriptyline (ELAVIL) 50 MG tablet Take 50 mg by mouth at bedtime.   Yes Historical Provider, MD  amLODipine-atorvastatin (CADUET) 5-10 MG per tablet Take 1 tablet by mouth daily.   Yes Historical Provider, MD  aspirin 81 MG tablet Take 81 mg by mouth daily.   Yes Historical Provider, MD  Carboxymethylcellul-Glycerin (OPTIVE) 0.5-0.9 % SOLN Apply 1-2 drops to eye 3 (three) times daily as needed.   Yes Historical Provider, MD  colchicine 0.6 MG tablet Take 0.6 mg by mouth daily. 2 BY MOUTH FIRST SIGN OF GOUT THEN 1 DAILY AS NEEDED   Yes Historical Provider, MD  ergocalciferol (VITAMIN D2) 50000 UNITS capsule Take 50,000 Units by mouth every 30 (thirty) days.   Yes Historical Provider, MD  esomeprazole (NEXIUM) 40 MG capsule Take 40 mg by mouth daily at 12 noon. AM   Yes Historical Provider, MD  ferrous sulfate 325 (65 FE) MG tablet Take 325 mg by mouth daily with breakfast.   Yes  Historical Provider, MD  gabapentin (NEURONTIN) 600 MG tablet Take 600 mg by mouth 2 (two) times daily.    Yes Historical Provider, MD  guaiFENesin-dextromethorphan (ROBITUSSIN DM) 100-10 MG/5ML syrup Take 5 mLs by mouth every 4 (four) hours as needed for cough.   Yes Historical Provider, MD  levothyroxine (SYNTHROID, LEVOTHROID) 88 MCG tablet Take 88 mcg by mouth daily before breakfast.   Yes Historical Provider, MD  methadone (DOLOPHINE) 10 MG tablet Take 1 tablet (10 mg total) by mouth daily with lunch. Patient taking differently: Take 10 mg by mouth 3 (three) times daily. 1 tablet at 0800, 1 tablet at 1330, 2 tablets at 2100 07/21/15  Yes Evon Slack, PA-C  polyethylene glycol (MIRALAX / GLYCOLAX) packet Take 17 g by mouth daily.   Yes Historical Provider, MD  pregabalin (LYRICA) 150 MG capsule Take 1 capsule (150 mg total) by mouth 2 (two) times daily. 07/21/15  Yes Evon Slack, PA-C  rOPINIRole (REQUIP) 1 MG tablet Take 1 mg by mouth 2 (two) times daily. 0800, 1930   Yes Historical Provider, MD  tiotropium (SPIRIVA) 18 MCG inhalation capsule Place 18 mcg into inhaler and inhale daily.   Yes Historical Provider, MD  trolamine salicylate (ASPERCREME) 10 % cream Apply 1 application topically 3 (three) times daily as needed  for muscle pain.   Yes Historical Provider, MD  methadone (DOLOPHINE) 10 MG tablet Take 3 tablets (30 mg total) by mouth daily with breakfast. Patient not taking: Reported on 01/04/2017 07/21/15   Evon Slack, PA-C   No Known Allergies  FAMILY HISTORY:  Family History  Problem Relation Age of Onset  . Lung cancer Sister    SOCIAL HISTORY:  reports that she has never smoked. She quit smokeless tobacco use about 17 months ago. Her smokeless tobacco use included Snuff. She reports that she does not drink alcohol or use drugs.  REVIEW OF SYSTEMS:   Constitutional: Feels well. Cardiovascular: No chest pain.  Pulmonary: Denies dyspnea.   The remainder of systems  were reviewed and were found to be negative other than what is documented in the HPI.    VITAL SIGNS: Temp:  [97.8 F (36.6 C)-100.2 F (37.9 C)] 97.8 F (36.6 C) (02/11 0913) Pulse Rate:  [51-91] 91 (02/11 0913) Resp:  [22-32] 22 (02/11 0501) BP: (106-141)/(41-51) 109/51 (02/11 0913) SpO2:  [72 %-97 %] 93 % (02/11 0913) HEMODYNAMICS:   VENTILATOR SETTINGS:   INTAKE / OUTPUT:  Intake/Output Summary (Last 24 hours) at 2016/12/20 1220 Last data filed at 2016-12-20 1109  Gross per 24 hour  Intake             1792 ml  Output                1 ml  Net             1791 ml    Physical Examination:   VS: BP (!) 109/51   Pulse 91   Temp 97.8 F (36.6 C) (Oral)   Resp (!) 22   Ht 5\' 2"  (1.575 m)   Wt 138 lb 14.4 oz (63 kg)   SpO2 93%   BMI 25.41 kg/m   General Appearance: No distress  Neuro:without focal findings, mental status, speech normal,. HEENT: PERRLA, EOM intact, no ptosis, no other lesions noticed;  Pulmonary: normal breath sounds., diaphragmatic excursion normal. CardiovascularNormal S1,S2.  No m/r/g.    Abdomen: Benign, Soft, non-tender, No masses, hepatosplenomegaly, No lymphadenopathy Renal:  No costovertebral tenderness  GU:  Not performed at this time. Endoc: No evident thyromegaly, no signs of acromegaly. Skin:   warm, no rashes, no ecchymosis  Extremities: normal, no cyanosis, clubbing, no edema, warm with normal capillary refill.    LABS: Reviewed   LABORATORY PANEL:   CBC  Recent Labs Lab 12/20/2016 0514  WBC 11.9*  HGB 8.2*  HCT 24.9*  PLT 288    Chemistries   Recent Labs Lab Dec 20, 2016 0514  NA 136  K 5.0  CL 107  CO2 20*  GLUCOSE 114*  BUN 38*  CREATININE 2.65*  CALCIUM 8.6*  MG 1.7     Recent Labs Lab 12/18/16 0724 12/18/16 1211 12/18/16 1651 12/18/16 2127 12/20/2016 0733 12/20/16 1148  GLUCAP 91 121* 91 129* 96 161*    Recent Labs Lab 12-20-2016 0958  PHART 7.29*  PCO2ART 35  PO2ART 41*   No results for input(s):  AST, ALT, ALKPHOS, BILITOT, ALBUMIN in the last 168 hours.  Cardiac Enzymes  Recent Labs Lab 12/20/2016 0514  TROPONINI 0.32*    RADIOLOGY:  Ct Head Wo Contrast  Result Date: 12/18/2016 CLINICAL DATA:  Acute onset of stroke-like symptoms. Difficulty with speech, and possible seizure like activity. Initial encounter. EXAM: CT HEAD WITHOUT CONTRAST TECHNIQUE: Contiguous axial images were obtained from the base of the skull  through the vertex without intravenous contrast. COMPARISON:  CT of the head performed 12/08/2016 FINDINGS: Brain: No evidence of acute infarction, hemorrhage, hydrocephalus, extra-axial collection or mass lesion/mass effect. Prominence of the sulci suggests mild cortical volume loss. Mild cerebellar atrophy is noted. Mild periventricular white matter change likely reflects small vessel ischemic microangiopathy. The brainstem and fourth ventricle are within normal limits. The basal ganglia are unremarkable in appearance. The cerebral hemispheres demonstrate grossly normal gray-white differentiation. No mass effect or midline shift is seen. Vascular: No hyperdense vessel or unexpected calcification. Skull: There is no evidence of fracture; visualized osseous structures are unremarkable in appearance. Sinuses/Orbits: The visualized portions of the orbits are within normal limits. The paranasal sinuses and mastoid air cells are well-aerated. Other: No significant soft tissue abnormalities are seen. IMPRESSION: 1. No acute intracranial pathology seen on CT. 2. Mild cortical volume loss and scattered small vessel ischemic microangiopathy. Electronically Signed   By: Roanna RaiderJeffery  Chang M.D.   On: 12/18/2016 17:44   Mr Brain Wo Contrast  Result Date: 12/18/2016 CLINICAL DATA:  Episode of seizure like activity. Slurred speech. Tremors. Disorientation. EXAM: MRI HEAD WITHOUT CONTRAST TECHNIQUE: Multiplanar, multiecho pulse sequences of the brain and surrounding structures were obtained without  intravenous contrast. COMPARISON:  Head CT same day.  Head CT 12/08/2016. FINDINGS: Brain: Diffusion imaging does not show any acute or subacute infarction. The study does suffer from considerable motion degradation. The brainstem is negative. There are old small vessel infarctions affecting the cerebellum bilaterally. Cerebral hemispheres show moderate chronic small-vessel ischemic changes of the deep white matter. No large vessel territory infarction. No mass lesion, hemorrhage, hydrocephalus or extra-axial collection. No pituitary mass. Vascular: Major vessels at the base of the brain show flow. Skull and upper cervical spine: Negative Sinuses/Orbits: Clear/normal Other: None significant IMPRESSION: No acute finding. 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 Moderate chronic small-vessel ischemic changes of the cerebral hemispheric white matter. No specific cause of seizure identified. These results will be called to the ordering clinician or representative by the Radiologist Assistant, and communication documented in the PACS or zVision Dashboard. Electronically Signed   By: Paulina FusiMark  Shogry M.D.   On: 12/18/2016 20:56   Dg Chest Port 1 View  Result Date: 12/18/2016 CLINICAL DATA:  Cough. EXAM: PORTABLE CHEST 1 VIEW COMPARISON:  12/27/2016 FINDINGS: Borderline heart size with normal pulmonary vascularity. Patchy airspace infiltrates are demonstrated in the right upper lung and left perihilar region as well as in the lung bases. There is progression since previous study. This likely represents multifocal pneumonia. Edema less likely. No blunting of costophrenic angles. No pneumothorax. Tortuous aorta. Degenerative changes in the spine. IMPRESSION: Increasing airspace disease in both lungs suggesting multifocal pneumonia. Electronically Signed   By: Burman NievesWilliam  Stevens M.D.   On: 12/18/2016 00:23       --Wells Guileseep Xitlalli Newhard, MD.  Board Certified in Internal Medicine, Pulmonary Medicine, Critical  Care Medicine, and Sleep Medicine.  ICU Pager [phone] [facility] Office Number: [phone]  Santiago Gladavid Kasa, M.D.  Stephanie AcreVishal Mungal, M.D.  Billy Fischeravid Simonds, M.D   12/23/2016, 12:20 PM  Critical Care Attestation.  I have personally obtained a history, examined the patient, evaluated laboratory and imaging results, formulated the assessment and plan and placed orders. The Patient requires high complexity decision making for assessment and support, frequent evaluation and titration of therapies, application of advanced monitoring technologies and extensive interpretation of multiple databases. The patient has critical illness that could lead imminently to failure of 1 or  more organ systems and requires the highest level of physician preparedness to intervene.  Critical Care Time devoted to patient care services described in this note is 60 minutes and is exclusive of time spent in procedures.

## 2016-12-19 NOTE — Progress Notes (Signed)
*  PRELIMINARY RESULTS* Echocardiogram 2D Echocardiogram has been performed.  Garrel Ridgelikeshia S Litzi Binning 12/02/16, 11:39 AM

## 2016-12-19 NOTE — Consult Note (Addendum)
Cardiology Consultation Note  Patient ID: Chloe Sanchez, MRN: [id number], DOB/AGE: 1941-10-20 76 y.o. Admit date: 12/18/2016   Date of Consult: 12/10/2016 Primary Physician: Delton Prairie, FNP Primary Cardiologist: new to [facility]  Chief Complaint: respiratory distress Reason for Consult:arrhythmia, SOB, elevated TNT Physician requesting consult: Dr. Imogene Burn   HPI: 76 y.o. female with h/o Arthritis, CHF, CKD, depression, diabetes, hypertension, was in ER last week after a fall and sent home, presenting with worsening weakness, shortness of breath with minimal exertion, cough over the past 3-4 days. Cardiology consult in for arrhythmia, shortness of breath, elevated troponin  EKG on February 8 in the emergency room showed what appeared to be 2-1 AV block, ventricular rate 51 In light of EKG on this admission, this was likely complete heart block  EKG on this admission showing complete heart block, junctional escape rhythm, ventricular rate 52 bpm  History obtained to the notes as patient is lethargic, unable to communicate well, on high flow oxygen , no family at the bedside. She was seen by myself this morning at 11 AM Recommendation was made to transfer to the unit for higher level of care given respiratory distress, hypoxia and for complete heart block  Per the notes, she was having weakness, shortness of breath on minimal exertion, worsening cough over the past 4 days.   fever at home but she did not check the temperature.  She went to her primary care doctor's office  and they sent her home  and did not give any medicine  She call primary care again complaining of weakness. They recommended she go to the emergency room  Admitted by medicine service for pneumonia/bradycardia     Past Medical History:  Diagnosis Date  . Anemia   . Anxiety   . Arthritis   . CHF (congestive heart failure) (HCC)   . Chronic kidney disease    CKD  . Depression   . Diabetes mellitus without  complication (HCC)   . Gout   . Hypertension   . Hypothyroidism   . On supplemental oxygen therapy    AT NIGHT  . Polyneuropathy (HCC)   . RLS (restless legs syndrome)   . Sjogren's syndrome (HCC)   . Sleep apnea       Most Recent Cardiac Studies: Echocardiogram showing normal LV function, severely elevated right heart pressures, severe aortic valve stenosis    Surgical History:  Past Surgical History:  Procedure Laterality Date  . BACK SURGERY     lumbar  . CHOLECYSTECTOMY    . FOOT SURGERY Bilateral   . TOTAL KNEE ARTHROPLASTY Right 07/17/2015   Procedure: TOTAL KNEE ARTHROPLASTY;  Surgeon: Kennedy Bucker, MD;  Location: [facility] ORS;  Service: Orthopedics;  Laterality: Right;     Home Meds: Prior to Admission medications   Medication Sig Start Date End Date Taking? Authorizing Provider  albuterol (PROVENTIL HFA;VENTOLIN HFA) 108 (90 BASE) MCG/ACT inhaler Inhale 2 puffs into the lungs every 4 (four) hours as needed for wheezing or shortness of breath.   Yes Historical Provider, MD  allopurinol (ZYLOPRIM) 100 MG tablet Take 100 mg by mouth daily.   Yes Historical Provider, MD  amitriptyline (ELAVIL) 50 MG tablet Take 50 mg by mouth at bedtime.   Yes Historical Provider, MD  amLODipine-atorvastatin (CADUET) 5-10 MG per tablet Take 1 tablet by mouth daily.   Yes Historical Provider, MD  aspirin 81 MG tablet Take 81 mg by mouth daily.   Yes Historical Provider, MD  Carboxymethylcellul-Glycerin (  OPTIVE) 0.5-0.9 % SOLN Apply 1-2 drops to eye 3 (three) times daily as needed.   Yes Historical Provider, MD  colchicine 0.6 MG tablet Take 0.6 mg by mouth daily. 2 BY MOUTH FIRST SIGN OF GOUT THEN 1 DAILY AS NEEDED   Yes Historical Provider, MD  ergocalciferol (VITAMIN D2) 50000 UNITS capsule Take 50,000 Units by mouth every 30 (thirty) days.   Yes Historical Provider, MD  esomeprazole (NEXIUM) 40 MG capsule Take 40 mg by mouth daily at 12 noon. AM   Yes Historical Provider, MD  ferrous sulfate  325 (65 FE) MG tablet Take 325 mg by mouth daily with breakfast.   Yes Historical Provider, MD  gabapentin (NEURONTIN) 600 MG tablet Take 600 mg by mouth 2 (two) times daily.    Yes Historical Provider, MD  guaiFENesin-dextromethorphan (ROBITUSSIN DM) 100-10 MG/5ML syrup Take 5 mLs by mouth every 4 (four) hours as needed for cough.   Yes Historical Provider, MD  levothyroxine (SYNTHROID, LEVOTHROID) 88 MCG tablet Take 88 mcg by mouth daily before breakfast.   Yes Historical Provider, MD  methadone (DOLOPHINE) 10 MG tablet Take 1 tablet (10 mg total) by mouth daily with lunch. Patient taking differently: Take 10 mg by mouth 3 (three) times daily. 1 tablet at 0800, 1 tablet at 1330, 2 tablets at 2100 07/21/15  Yes Evon Slack, PA-C  polyethylene glycol (MIRALAX / GLYCOLAX) packet Take 17 g by mouth daily.   Yes Historical Provider, MD  pregabalin (LYRICA) 150 MG capsule Take 1 capsule (150 mg total) by mouth 2 (two) times daily. 07/21/15  Yes Evon Slack, PA-C  rOPINIRole (REQUIP) 1 MG tablet Take 1 mg by mouth 2 (two) times daily. 0800, 1930   Yes Historical Provider, MD  tiotropium (SPIRIVA) 18 MCG inhalation capsule Place 18 mcg into inhaler and inhale daily.   Yes Historical Provider, MD  trolamine salicylate (ASPERCREME) 10 % cream Apply 1 application topically 3 (three) times daily as needed for muscle pain.   Yes Historical Provider, MD  methadone (DOLOPHINE) 10 MG tablet Take 3 tablets (30 mg total) by mouth daily with breakfast. Patient not taking: Reported on 12/24/2016 07/21/15   Evon Slack, PA-C    Inpatient Medications:  . allopurinol  100 mg Oral Daily  . amitriptyline  50 mg Oral QHS  . aspirin  325 mg Oral Daily  . atorvastatin  40 mg Oral q1800  . Chlorhexidine Gluconate Cloth  6 each Topical Q0600  . ferrous sulfate  325 mg Oral Q breakfast  . fluticasone  1 spray Each Nare Daily  . furosemide  20 mg Intravenous BID  . gabapentin  600 mg Oral TID  . heparin  5,000  Units Subcutaneous Q8H  . insulin aspart  0-9 Units Subcutaneous TID WC  . ipratropium-albuterol  3 mL Nebulization TID  . levothyroxine  88 mcg Oral QAC breakfast  . loratadine  10 mg Oral Daily  . methadone  10 mg Oral BID  . methadone  20 mg Oral QHS  . methylPREDNISolone (SOLU-MEDROL) injection  60 mg Intravenous Q6H  . montelukast  10 mg Oral QHS  . mupirocin ointment  1 application Nasal BID  . pantoprazole  40 mg Oral Daily  . piperacillin-tazobactam (ZOSYN)  IV  3.375 g Intravenous Q12H  . pregabalin  150 mg Oral BID  . tiotropium  18 mcg Inhalation Daily  . vancomycin  1,000 mg Intravenous Q36H     Allergies: No Known Allergies  Social  History   Social History  . Marital status: Widowed    Spouse name: N/A  . Number of children: N/A  . Years of education: N/A   Occupational History  . Not on file.   Social History Main Topics  . Smoking status: Never Smoker  . Smokeless tobacco: Former Neurosurgeon    Types: Snuff    Quit date: 07/16/2015  . Alcohol use No  . Drug use: No  . Sexual activity: No   Other Topics Concern  . Not on file   Social History Narrative  . No narrative on file     Family History  Problem Relation Age of Onset  . Lung cancer Sister      Review of Systems: Review of Systems  Unable to perform ROS: Critical illness    Labs: CBC  Recent Labs  12/18/16 0753 12-31-2016 0514  WBC 7.8 11.9*  HGB 7.8* 8.2*  HCT 23.1* 24.9*  MCV 89.8 89.7  PLT 258 288   Basic Metabolic Panel  Recent Labs  12/18/16 0753 12/31/2016 0514  NA 136 136  K 4.4 5.0  CL 106 107  CO2 24 20*  GLUCOSE 103* 114*  BUN 33* 38*  CREATININE 2.47* 2.65*  CALCIUM 8.5* 8.6*  MG  --  1.7   Liver Function Tests No results for input(s): AST, ALT, ALKPHOS, BILITOT, PROT, ALBUMIN in the last 72 hours. No results for input(s): LIPASE, AMYLASE in the last 72 hours. Cardiac Enzymes  Recent Labs  12/22/2016 1613 December 31, 2016 0514  TROPONINI <0.03 0.32*    BNP Invalid input(s): POCBNP D-Dimer No results for input(s): DDIMER in the last 72 hours. Hemoglobin A1C No results for input(s): HGBA1C in the last 72 hours. Fasting Lipid Panel  Recent Labs  31-Dec-2016 0514  CHOL 108  HDL 43  LDLCALC 50  TRIG 74  CHOLHDL 2.5   Thyroid Function Tests No results for input(s): TSH, T4TOTAL, T3FREE, THYROIDAB in the last 72 hours.  Invalid input(s): FREET3  Radiology/Studies:  Dg Chest 2 View  Result Date: 12/31/2016 CLINICAL DATA:  LEFT chest pain beginning a few days ago. Dizziness and shortness of breath. History of CHF, diabetes. EXAM: CHEST  2 VIEW COMPARISON:  Chest radiograph December 29, 2015 FINDINGS: Cardiomediastinal silhouette is normal. Mildly calcified aortic knob. Increasing mild interstitial prominence with faint RIGHT upper lobe airspace opacities. Similar bibasilar strandy densities. Flattened hemidiaphragms. No pleural effusion. No pneumothorax. Osteopenia. Broad dextroscoliosis and mild degenerative change of the spine. Old LEFT rib fractures. Surgical clips in the included right abdomen compatible with cholecystectomy. IMPRESSION: Increasing interstitial prominence with patchy RIGHT upper lobe airspace opacities concerning for bronchopneumonia. Followup PA and lateral chest X-ray is recommended in 3-4 weeks following trial of antibiotic therapy to ensure resolution and exclude underlying malignancy. Similar bibasilar atelectasis/scarring. Electronically Signed   By: Awilda Metro M.D.   On: 12/19/2016 16:57   Ct Head Wo Contrast  Result Date: 12/18/2016 CLINICAL DATA:  Acute onset of stroke-like symptoms. Difficulty with speech, and possible seizure like activity. Initial encounter. EXAM: CT HEAD WITHOUT CONTRAST TECHNIQUE: Contiguous axial images were obtained from the base of the skull through the vertex without intravenous contrast. COMPARISON:  CT of the head performed 12/08/2016 FINDINGS: Brain: No evidence of acute  infarction, hemorrhage, hydrocephalus, extra-axial collection or mass lesion/mass effect. Prominence of the sulci suggests mild cortical volume loss. Mild cerebellar atrophy is noted. Mild periventricular white matter change likely reflects small vessel ischemic microangiopathy. The brainstem and fourth ventricle are  within normal limits. The basal ganglia are unremarkable in appearance. The cerebral hemispheres demonstrate grossly normal gray-white differentiation. No mass effect or midline shift is seen. Vascular: No hyperdense vessel or unexpected calcification. Skull: There is no evidence of fracture; visualized osseous structures are unremarkable in appearance. Sinuses/Orbits: The visualized portions of the orbits are within normal limits. The paranasal sinuses and mastoid air cells are well-aerated. Other: No significant soft tissue abnormalities are seen. IMPRESSION: 1. No acute intracranial pathology seen on CT. 2. Mild cortical volume loss and scattered small vessel ischemic microangiopathy. Electronically Signed   By: Roanna RaiderJeffery  Chang M.D.   On: 12/18/2016 17:44   Ct Head Wo Contrast  Result Date: 12/08/2016 CLINICAL DATA:  Larey SeatFell in the bedroom, striking her head. Hematoma posteriorly. EXAM: CT HEAD WITHOUT CONTRAST CT CERVICAL SPINE WITHOUT CONTRAST TECHNIQUE: Multidetector CT imaging of the head and cervical spine was performed following the standard protocol without intravenous contrast. Multiplanar CT image reconstructions of the cervical spine were also generated. COMPARISON:  None. FINDINGS: CT HEAD FINDINGS Brain: There is no intracranial hemorrhage, mass or evidence of acute infarction. There is mild chronic microvascular ischemic change. There is no significant extra-axial fluid collection. No acute intracranial findings are evident. Vascular: No hyperdense vessel or unexpected calcification. Skull: Normal. Negative for fracture or focal lesion. Sinuses/Orbits: No acute finding. Other: None. CT  CERVICAL SPINE FINDINGS Alignment: Normal. Skull base and vertebrae: No acute fracture. No primary bone lesion or focal pathologic process. Soft tissues and spinal canal: No prevertebral fluid or swelling. No visible canal hematoma. Disc levels: Moderate degenerative cervical disc disease at C5-6 and C6-7. Slight anterolisthesis at Celsius 3 4 and C4-5, likely degenerative. Moderate cervical facet arthritis. Upper chest: Negative. Other: None IMPRESSION: 1. No acute intracranial findings. There is mild chronic appearing white matter hypodensities which likely represent small vessel ischemic disease. 2. Negative for acute cervical spine fracture Electronically Signed   By: Ellery Plunkaniel R Mitchell M.D.   On: 12/08/2016 22:17   Ct Cervical Spine Wo Contrast  Result Date: 12/08/2016 CLINICAL DATA:  Larey SeatFell in the bedroom, striking her head. Hematoma posteriorly. EXAM: CT HEAD WITHOUT CONTRAST CT CERVICAL SPINE WITHOUT CONTRAST TECHNIQUE: Multidetector CT imaging of the head and cervical spine was performed following the standard protocol without intravenous contrast. Multiplanar CT image reconstructions of the cervical spine were also generated. COMPARISON:  None. FINDINGS: CT HEAD FINDINGS Brain: There is no intracranial hemorrhage, mass or evidence of acute infarction. There is mild chronic microvascular ischemic change. There is no significant extra-axial fluid collection. No acute intracranial findings are evident. Vascular: No hyperdense vessel or unexpected calcification. Skull: Normal. Negative for fracture or focal lesion. Sinuses/Orbits: No acute finding. Other: None. CT CERVICAL SPINE FINDINGS Alignment: Normal. Skull base and vertebrae: No acute fracture. No primary bone lesion or focal pathologic process. Soft tissues and spinal canal: No prevertebral fluid or swelling. No visible canal hematoma. Disc levels: Moderate degenerative cervical disc disease at C5-6 and C6-7. Slight anterolisthesis at Celsius 3 4 and  C4-5, likely degenerative. Moderate cervical facet arthritis. Upper chest: Negative. Other: None IMPRESSION: 1. No acute intracranial findings. There is mild chronic appearing white matter hypodensities which likely represent small vessel ischemic disease. 2. Negative for acute cervical spine fracture Electronically Signed   By: Ellery Plunkaniel R Mitchell M.D.   On: 12/08/2016 22:17   Mr Brain Wo Contrast  Result Date: 12/18/2016 CLINICAL DATA:  Episode of seizure like activity. Slurred speech. Tremors. Disorientation. EXAM: MRI HEAD WITHOUT CONTRAST TECHNIQUE: Multiplanar, multiecho  pulse sequences of the brain and surrounding structures were obtained without intravenous contrast. COMPARISON:  Head CT same day.  Head CT 12/08/2016. FINDINGS: Brain: Diffusion imaging does not show any acute or subacute infarction. The study does suffer from considerable motion degradation. The brainstem is negative. There are old small vessel infarctions affecting the cerebellum bilaterally. Cerebral hemispheres show moderate chronic small-vessel ischemic changes of the deep white matter. No large vessel territory infarction. No mass lesion, hemorrhage, hydrocephalus or extra-axial collection. No pituitary mass. Vascular: Major vessels at the base of the brain show flow. Skull and upper cervical spine: Negative Sinuses/Orbits: Clear/normal Other: None significant IMPRESSION: No acute finding. 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 Moderate chronic small-vessel ischemic changes of the cerebral hemispheric white matter. No specific cause of seizure identified. These results will be called to the ordering clinician or representative by the Radiologist Assistant, and communication documented in the PACS or zVision Dashboard. Electronically Signed   By: Paulina Fusi M.D.   On: 12/18/2016 20:56   Dg Chest Port 1 View  Result Date: 12/18/2016 CLINICAL DATA:  Cough. EXAM: PORTABLE CHEST 1 VIEW COMPARISON:  12/15/2016  FINDINGS: Borderline heart size with normal pulmonary vascularity. Patchy airspace infiltrates are demonstrated in the right upper lung and left perihilar region as well as in the lung bases. There is progression since previous study. This likely represents multifocal pneumonia. Edema less likely. No blunting of costophrenic angles. No pneumothorax. Tortuous aorta. Degenerative changes in the spine. IMPRESSION: Increasing airspace disease in both lungs suggesting multifocal pneumonia. Electronically Signed   By: Burman Nieves M.D.   On: 12/18/2016 00:23    EKG: EKG shows normal sinus rhythm with complete heart block junctional escape rhythm 52 bpm  EKG lab work, chest x-ray, echocardiogram reviewed independently by myself Checks x-ray concerning for pulmonary edema  Weights: Filed Weights   01/05/2017 1612 12/30/2016 2010  Weight: 139 lb (63 kg) 138 lb 14.4 oz (63 kg)     Physical Exam:Telemetry showing complete heart block rate 51 bpm Blood pressure (!) 109/51, pulse 91, temperature 97.8 F (36.6 C), temperature source Oral, resp. rate (!) 22, height 5\' 2"  (1.575 m), weight 138 lb 14.4 oz (63 kg), SpO2 93 %. Body mass index is 25.41 kg/m. GEN: Well nourished, well developed, respiratory distress on high flow nasal cannula HEENT: Grossly normal.  Neck: Supple, no JVD, carotid bruits, or masses. Cardiac: RRR, no murmurs, rubs, or gallops. No clubbing, cyanosis, edema.  Radials/DP/PT 2+ and equal bilaterally.  Respiratory:  Respirations labored, dull at the bases, Rales bilaterally GI: Soft, nontender, nondistended, BS + x 4. MS: no deformity or atrophy. Skin: warm and dry, no rash. Neuro:  Unable to test strength Psych: Lethargic, arousable to touch     Assessment and Plan:   1. Complete heart block Will monitor closely, for any acute decompensation she will need pacing wire Suspect pulmonary edema on chest x-ray secondary to complete heart block Less likely pneumonia She is on no  rate or rhythm agents that would contribute to arrhythmia Likely contributing to her respiratory distress Patient and family previously mentioned they did not want intubation or aggressive care We will try to have discussion with family, currently not at the bedside  2. Acute diastolic CHF Secondary to severe aortic valve stenosis, complete heart block Lasix 40 mill grams IV every 6 hours as blood pressure tolerates Respiratory support with high flow nasal cannula, BiPAP if needed Discussed with respiratory  3) severe aortic valve stenosis  Measurements are severe on echocardiogram done today Previously noted to be moderate one year ago Likely contributing to CHF symptoms Will need to stabilize respiratory status, heart rate, Determine if permanent pacer is indicated, At that time could discuss with surgical team in [facility] whether she would be a candidate for TAVR  4) acute on chronic renal failure Likely exacerbated by complete heart block Blood pressure support  5) hypotension Secondary to complete heart block, Unable to exclude component of sepsis, possible pneumonia? Will need pressors if blood pressure drops  6)Anemia Unable to exclude iron deficiency or secondary to chronic renal failure   worse over the past month likely contributing to her shortness of breath Would recommend PRBC 2 given her respiratory distress  Case discussed with nursing, ICU hospitalist  Total encounter time more than 110 minutes  Greater than 50% was spent in counseling and coordination of care with the patient   Signed, Dossie Arbour, MD, Ph.D [facility] 01/15/17

## 2016-12-19 NOTE — Progress Notes (Signed)
Pt rolling to Cath lab for transvenous pacemaker placement, Dr. Mariah MillingGollan to accompany patient.

## 2016-12-19 NOTE — Procedures (Signed)
12/22/2016   Procedure Note:  Arterial Line Placement Teressa LowerEdith O Salo , [id number] , [id number]  Indications:  Hemodynamic instability / recurrent ABG draws   Allen's test performed to ensure adequate perfusion.  Time out was performed verifying correct patient, procedure, site, positioning, and special catheter was available at the time of procedure.  [name] left femoral artery was prepped and draped in usual sterile fashion.  1 % Lidocaine was not used to anesthetize the area.  Total number of attempts were 1.  A 20 gauge arterial line was introduced into the left femoral artery.  Catheter threaded and the needle was removed with appropriate blood return.  Blood loss was minimal.  Patient tolerated the procedure well, and there were no complications.     Shane CrutchPradeep Lavon Bothwell, MD [facility]

## 2016-12-19 NOTE — Progress Notes (Signed)
Dr. Thad Rangereynolds in room. Dr. Tora Duckollaru in . Lasix  IV given with additional aspirin. RTC from Dr. Imogene Burnhen with reviewed ABG results reviewed. Advised pt only wakes for few seconds and returns to sleep; need to increase level of care. Will prepare pt for transfer to step down unit.

## 2016-12-19 NOTE — Procedures (Signed)
Central Venous Catheter Placement: Indication: Patient receiving vesicant or irritant drug.; Patient receiving intravenous therapy for longer than 5 days.; Patient has limited or no vascular access.   Consent: emergent   Hand washing performed prior to starting the procedure.   Procedure: An active timeout was performed and correct patient, name, & ID confirmed.  After explaining risk and benefits, patient was positioned correctly for central venous access. Patient was prepped using strict sterile technique including chlorohexadine preps, sterile drape, sterile gown and sterile gloves.  The area was prepped, draped and anesthetized in the usual sterile manner. Patient comfort was obtained.  A triple lumen catheter was placed in left subclavian Vein There was good blood return, catheter caps were placed on lumens, catheter flushed easily, the line was secured and a sterile dressing and BIO-PATCH applied.    Number of Attempts: 1 Complications:none Estimated Blood Loss: none Chest Radiograph indicated and ordered.  Operator: Wells Guileseep Cidney Kirkwood, M.D.   Wells Guileseep Antoni Stefan, M.D.  Pulmonary & Critical Care Medicine 12/12/2016

## 2016-12-19 NOTE — Progress Notes (Signed)
Fingers swelling with 1 plain band removed from left middle finger due to swelling with son present and given to him. Other rings still appropriate.

## 2016-12-19 NOTE — Progress Notes (Signed)
Nurse Supervisor contacted son to see if he would come to the hospital. Son voiced he spoke with Dr. Mariah MillingGollan and will not be able to come to the hospital at this time. Son wants to continue with measures discussed with MD.

## 2016-12-19 NOTE — Progress Notes (Signed)
PT Cancellation Note  Patient Details Name: Chloe Sanchez MRN: [id number] DOB: August 23, 1941   Cancelled Treatment:    Reason Eval/Treat Not Completed: Medical issues which prohibited therapy (Patient noted with transfer to CCU due to decline in medical status; currently with temporary pacer, intubated on full mechanical ventilation.  Will complete initial PT orders at this time; please re-consult as medically appropriate.)   Tyronza Happe H. Manson PasseyBrown, PT, DPT, NCS 12/09/2016, 8:28 PM [phone]

## 2016-12-19 NOTE — Progress Notes (Signed)
CH responded to a PG for a Code Stemi. Pt is being transported to the Cath Lab to get a pacemaker. Family was contacted but will not be back this evening as they are an hour away. CH provided silent prayer. [facility] is available for follow up as needed.    2017-01-29 1600  Clinical Encounter Type  Visited With Health care provider  Visit Type Initial;Follow-up;Spiritual support;Code;Critical Care (CODE STEMI)  Referral From Nurse  Consult/Referral To Chaplain  Spiritual Encounters  Spiritual Needs Prayer

## 2016-12-19 NOTE — Consult Note (Signed)
[facility]  CONSULT NOTE    Date: 01/15/17                  Patient Name:  Chloe Sanchez  MRN: [id number]  DOB: 09/09/1941  Age / Sex: 76 y.o., female         PCP: Delton Prairie, FNP                 Service Requesting Consult: Dr. Cherlynn Kaiser                 Reason for Consult: Acute renal failure            History of Present Illness: Ms. Chloe Sanchez is a 76 y.o. white female with diabetes mellitus type II, hypertension, peripheral vascular disease, obstructive sleep apnea, congestive heart failure, anemia, hypothyroidism, sjogren's, hyperlipidemia, seizure disorder and questionable lupus, who was admitted to [facility] on 12/14/2016 for Bradycardia [R00.1] Chest wall pain [R07.89] Generalized weakness [R53.1] Community acquired pneumonia of right upper lobe of lung (HCC) [J18.1]  Patient unable to give much history. History from chart. Last seen in our office in 2009 by Dr. Mosetta Pigeon.   Patient admitted on 12/09/2016 with creatinine of 1.77 and this has trended up to 2.65. Nephrology consulted for acute renal failure on chronic kidney disease stage III. Does not seem to see a nephrologist as outpatient.   Overnight, patient seems to have had seizure like activity. Currently confused.   Pneumonia recently changed antibiotics to pip/tazo and vancomycin. Placed on BIPAP. Troponin elevated and concern for demand ischemia.    Medications: Outpatient medications: Prescriptions Prior to Admission  Medication Sig Dispense Refill Last Dose  . albuterol (PROVENTIL HFA;VENTOLIN HFA) 108 (90 BASE) MCG/ACT inhaler Inhale 2 puffs into the lungs every 4 (four) hours as needed for wheezing or shortness of breath.   prn at prn  . allopurinol (ZYLOPRIM) 100 MG tablet Take 100 mg by mouth daily.   12/15/2016 at 0800  . amitriptyline (ELAVIL) 50 MG tablet Take 50 mg by mouth at bedtime.   12/15/2016 at 2000  . amLODipine-atorvastatin (CADUET) 5-10 MG per tablet Take 1 tablet  by mouth daily.   12/21/2016 at 0800  . aspirin 81 MG tablet Take 81 mg by mouth daily.   01/05/2017 at 0800  . Carboxymethylcellul-Glycerin (OPTIVE) 0.5-0.9 % SOLN Apply 1-2 drops to eye 3 (three) times daily as needed.   prn at prn  . colchicine 0.6 MG tablet Take 0.6 mg by mouth daily. 2 BY MOUTH FIRST SIGN OF GOUT THEN 1 DAILY AS NEEDED   prn at prn  . ergocalciferol (VITAMIN D2) 50000 UNITS capsule Take 50,000 Units by mouth every 30 (thirty) days.   Past Month at Unknown time  . esomeprazole (NEXIUM) 40 MG capsule Take 40 mg by mouth daily at 12 noon. AM   12/19/2016 at 0800  . ferrous sulfate 325 (65 FE) MG tablet Take 325 mg by mouth daily with breakfast.   12/10/2016 at 0800  . gabapentin (NEURONTIN) 600 MG tablet Take 600 mg by mouth 2 (two) times daily.    12/15/2016 at 0800  . guaiFENesin-dextromethorphan (ROBITUSSIN DM) 100-10 MG/5ML syrup Take 5 mLs by mouth every 4 (four) hours as needed for cough.   prn at prn  . levothyroxine (SYNTHROID, LEVOTHROID) 88 MCG tablet Take 88 mcg by mouth daily before breakfast.   12/27/2016 at 0730  . methadone (DOLOPHINE) 10 MG tablet Take 1 tablet (10 mg total)  by mouth daily with lunch. (Patient taking differently: Take 10 mg by mouth 3 (three) times daily. 1 tablet at 0800, 1 tablet at 1330, 2 tablets at 2100) 30 tablet 0   . polyethylene glycol (MIRALAX / GLYCOLAX) packet Take 17 g by mouth daily.   prn at prn  . pregabalin (LYRICA) 150 MG capsule Take 1 capsule (150 mg total) by mouth 2 (two) times daily. 30 capsule 0 01/12/17 at 0800  . rOPINIRole (REQUIP) 1 MG tablet Take 1 mg by mouth 2 (two) times daily. 0800, 1930   2017-01-12 at 0800  . tiotropium (SPIRIVA) 18 MCG inhalation capsule Place 18 mcg into inhaler and inhale daily.   12-Jan-2017 at 0800  . trolamine salicylate (ASPERCREME) 10 % cream Apply 1 application topically 3 (three) times daily as needed for muscle pain.   prn at prn  . methadone (DOLOPHINE) 10 MG tablet Take 3 tablets (30 mg total) by mouth  daily with breakfast. (Patient not taking: Reported on 01-12-17) 30 tablet 0 Not Taking at Unknown time    Current medications: Current Facility-Administered Medications  Medication Dose Route Frequency Provider Last Rate Last Dose  . acetaminophen (TYLENOL) tablet 650 mg  650 mg Oral Q6H PRN Shaune Pollack, MD      . allopurinol (ZYLOPRIM) tablet 100 mg  100 mg Oral Daily Altamese Dilling, MD   100 mg at 01/01/2017 0932  . amitriptyline (ELAVIL) tablet 50 mg  50 mg Oral QHS Altamese Dilling, MD   50 mg at 12/18/16 2144  . aspirin tablet 325 mg  325 mg Oral Daily Shaune Pollack, MD   325 mg at 12/31/2016 1106  . atorvastatin (LIPITOR) tablet 40 mg  40 mg Oral q1800 Shaune Pollack, MD   40 mg at 12/18/16 1752  . Chlorhexidine Gluconate Cloth 2 % PADS 6 each  6 each Topical Q0600 Altamese Dilling, MD   6 each at 12/11/2016 0541  . colchicine tablet 0.6 mg  0.6 mg Oral Daily PRN Shaune Pollack, MD      . ferrous sulfate tablet 325 mg  325 mg Oral Q breakfast Altamese Dilling, MD   325 mg at 01/01/2017 0932  . fluticasone (FLONASE) 50 MCG/ACT nasal spray 1 spray  1 spray Each Nare Daily Altamese Dilling, MD   1 spray at 01/01/2017 0930  . furosemide (LASIX) injection 20 mg  20 mg Intravenous BID Shaune Pollack, MD   20 mg at 01/03/2017 1105  . gabapentin (NEURONTIN) tablet 600 mg  600 mg Oral TID Altamese Dilling, MD   600 mg at 12/17/2016 0931  . guaiFENesin-dextromethorphan (ROBITUSSIN DM) 100-10 MG/5ML syrup 5 mL  5 mL Oral Q4H PRN Altamese Dilling, MD   5 mL at 12/17/16 2317  . heparin injection 5,000 Units  5,000 Units Subcutaneous Q8H Altamese Dilling, MD   5,000 Units at 12/13/2016 0538  . insulin aspart (novoLOG) injection 0-9 Units  0-9 Units Subcutaneous TID WC Altamese Dilling, MD   1 Units at 12/18/16 1217  . ipratropium-albuterol (DUONEB) 0.5-2.5 (3) MG/3ML nebulizer solution 3 mL  3 mL Nebulization TID Altamese Dilling, MD   3 mL at 12/31/2016 0812  . levothyroxine (SYNTHROID,  LEVOTHROID) tablet 88 mcg  88 mcg Oral QAC breakfast Altamese Dilling, MD   88 mcg at 12/11/2016 0931  . loratadine (CLARITIN) tablet 10 mg  10 mg Oral Daily Altamese Dilling, MD   10 mg at 01/02/2017 0931  . methadone (DOLOPHINE) tablet 10 mg  10 mg Oral BID Oralia Manis,  MD   10 mg at 12/28/2016 0932  . methadone (DOLOPHINE) tablet 20 mg  20 mg Oral QHS Oralia Manis, MD   20 mg at 12/18/16 2144  . methylPREDNISolone sodium succinate (SOLU-MEDROL) 125 mg/2 mL injection 60 mg  60 mg Intravenous Q6H Shaune Pollack, MD   60 mg at 12/10/2016 0927  . montelukast (SINGULAIR) tablet 10 mg  10 mg Oral QHS Altamese Dilling, MD   10 mg at 12/18/16 2143  . mupirocin ointment (BACTROBAN) 2 % 1 application  1 application Nasal BID Altamese Dilling, MD   1 application at 12/16/2016 0930  . oxyCODONE-acetaminophen (PERCOCET/ROXICET) 5-325 MG per tablet 1-2 tablet  1-2 tablet Oral Q6H PRN Altamese Dilling, MD   2 tablet at 12/17/16 1657  . pantoprazole (PROTONIX) EC tablet 40 mg  40 mg Oral Daily Altamese Dilling, MD   40 mg at 01/01/2017 0932  . piperacillin-tazobactam (ZOSYN) IVPB 3.375 g  3.375 g Intravenous Q12H Oralia Manis, MD   3.375 g at 12/24/2016 1610  . polyethylene glycol (MIRALAX / GLYCOLAX) packet 17 g  17 g Oral Daily PRN Altamese Dilling, MD      . pregabalin (LYRICA) capsule 150 mg  150 mg Oral BID Altamese Dilling, MD   150 mg at 01/02/2017 0931  . tiotropium (SPIRIVA) inhalation capsule 18 mcg  18 mcg Inhalation Daily Altamese Dilling, MD   18 mcg at 01/02/2017 0937  . vancomycin (VANCOCIN) IVPB 1000 mg/200 mL premix  1,000 mg Intravenous Q36H Oralia Manis, MD          Allergies: No Known Allergies    Past Medical History: Past Medical History:  Diagnosis Date  . Anemia   . Anxiety   . Arthritis   . CHF (congestive heart failure) (HCC)   . Chronic kidney disease    CKD  . Depression   . Diabetes mellitus without complication (HCC)   . Gout   . Hypertension    . Hypothyroidism   . On supplemental oxygen therapy    AT NIGHT  . Polyneuropathy (HCC)   . RLS (restless legs syndrome)   . Sjogren's syndrome (HCC)   . Sleep apnea      Past Surgical History: Past Surgical History:  Procedure Laterality Date  . BACK SURGERY     lumbar  . CHOLECYSTECTOMY    . FOOT SURGERY Bilateral   . TOTAL KNEE ARTHROPLASTY Right 07/17/2015   Procedure: TOTAL KNEE ARTHROPLASTY;  Surgeon: Kennedy Bucker, MD;  Location: [facility] ORS;  Service: Orthopedics;  Laterality: Right;     Family History: Family History  Problem Relation Age of Onset  . Lung cancer Sister      Social History: Social History   Social History  . Marital status: Widowed    Spouse name: N/A  . Number of children: N/A  . Years of education: N/A   Occupational History  . Not on file.   Social History Main Topics  . Smoking status: Never Smoker  . Smokeless tobacco: Former Neurosurgeon    Types: Snuff    Quit date: 07/16/2015  . Alcohol use No  . Drug use: No  . Sexual activity: No   Other Topics Concern  . Not on file   Social History Narrative  . No narrative on file     Review of Systems: Review of Systems  Unable to perform ROS: Mental status change    Vital Signs: Blood pressure (!) 109/51, pulse 91, temperature 97.8 F (36.6 C), temperature source Oral,  resp. rate (!) 22, height 5\' 2"  (1.575 m), weight 63 kg (138 lb 14.4 oz), SpO2 93 %.  Weight trends: Filed Weights   December 18, 2016 1612 December 18, 2016 2010  Weight: 63 kg (139 lb) 63 kg (138 lb 14.4 oz)    Physical Exam: General: NAD, laying in bed  Head: Normocephalic, atraumatic. Moist oral mucosal membranes  Eyes: Anicteric, PERRL  Neck: Supple, trachea midline  Lungs:  Bilateral coarse breath sounds, crackles and rhonchi, nonrebreather  Heart: Regular rate and rhythm  Abdomen:  Soft, nontender,   Extremities: no peripheral edema.  Neurologic: Alert and oriented times three, lethargic, following commands intermittently,  moving all four extremities  Skin: No lesions        Lab results: Basic Metabolic Panel:  Recent Labs Lab 12/17/16 0649 12/18/16 0753 12/22/2016 0514  NA 140 136 136  K 3.9 4.4 5.0  CL 110 106 107  CO2 22 24 20*  GLUCOSE 95 103* 114*  BUN 27* 33* 38*  CREATININE 1.51* 2.47* 2.65*  CALCIUM 8.7* 8.5* 8.6*  MG  --   --  1.7    Liver Function Tests: No results for input(s): AST, ALT, ALKPHOS, BILITOT, PROT, ALBUMIN in the last 168 hours. No results for input(s): LIPASE, AMYLASE in the last 168 hours. No results for input(s): AMMONIA in the last 168 hours.  CBC:  Recent Labs Lab December 18, 2016 1613 12/17/16 0649 12/18/16 0753 12/31/2016 0514  WBC 8.6 7.5 7.8 11.9*  HGB 9.1* 8.5* 7.8* 8.2*  HCT 26.8* 25.8* 23.1* 24.9*  MCV 89.8 90.9 89.8 89.7  PLT 295 274 258 288    Cardiac Enzymes:  Recent Labs Lab December 18, 2016 1613 12/27/2016 0514  TROPONINI <0.03 0.32*    BNP: Invalid input(s): POCBNP  CBG:  Recent Labs Lab 12/18/16 1211 12/18/16 1651 12/18/16 2127 12/12/2016 0733 12/18/2016 1148  GLUCAP 121* 91 129* 96 161*    Microbiology: Results for orders placed or performed during the hospital encounter of December 18, 2016  MRSA PCR Screening     Status: Abnormal   Collection Time: December 18, 2016 10:51 PM  Result Value Ref Range Status   MRSA by PCR POSITIVE (A) NEGATIVE Final    Comment:        The GeneXpert MRSA Assay (FDA approved for NASAL specimens only), is one component of a comprehensive MRSA colonization surveillance program. It is not intended to diagnose MRSA infection nor to guide or monitor treatment for MRSA infections. RESULT CALLED TO, READ BACK BY AND VERIFIED WITH: CYNTHIA BURGESS @ 0021 ON 12/17/2016 BY CAF     Coagulation Studies: No results for input(s): LABPROT, INR in the last 72 hours.  Urinalysis: No results for input(s): COLORURINE, LABSPEC, PHURINE, GLUCOSEU, HGBUR, BILIRUBINUR, KETONESUR, PROTEINUR, UROBILINOGEN, NITRITE, LEUKOCYTESUR in the  last 72 hours.  Invalid input(s): APPERANCEUR    Imaging: Ct Head Wo Contrast  Result Date: 12/18/2016 CLINICAL DATA:  Acute onset of stroke-like symptoms. Difficulty with speech, and possible seizure like activity. Initial encounter. EXAM: CT HEAD WITHOUT CONTRAST TECHNIQUE: Contiguous axial images were obtained from the base of the skull through the vertex without intravenous contrast. COMPARISON:  CT of the head performed 12/08/2016 FINDINGS: Brain: No evidence of acute infarction, hemorrhage, hydrocephalus, extra-axial collection or mass lesion/mass effect. Prominence of the sulci suggests mild cortical volume loss. Mild cerebellar atrophy is noted. Mild periventricular white matter change likely reflects small vessel ischemic microangiopathy. The brainstem and fourth ventricle are within normal limits. The basal ganglia are unremarkable in appearance. The cerebral hemispheres demonstrate grossly  normal gray-white differentiation. No mass effect or midline shift is seen. Vascular: No hyperdense vessel or unexpected calcification. Skull: There is no evidence of fracture; visualized osseous structures are unremarkable in appearance. Sinuses/Orbits: The visualized portions of the orbits are within normal limits. The paranasal sinuses and mastoid air cells are well-aerated. Other: No significant soft tissue abnormalities are seen. IMPRESSION: 1. No acute intracranial pathology seen on CT. 2. Mild cortical volume loss and scattered small vessel ischemic microangiopathy. Electronically Signed   By: Roanna Raider M.D.   On: 12/18/2016 17:44   Mr Brain Wo Contrast  Result Date: 12/18/2016 CLINICAL DATA:  Episode of seizure like activity. Slurred speech. Tremors. Disorientation. EXAM: MRI HEAD WITHOUT CONTRAST TECHNIQUE: Multiplanar, multiecho pulse sequences of the brain and surrounding structures were obtained without intravenous contrast. COMPARISON:  Head CT same day.  Head CT 12/08/2016. FINDINGS:  Brain: Diffusion imaging does not show any acute or subacute infarction. The study does suffer from considerable motion degradation. The brainstem is negative. There are old small vessel infarctions affecting the cerebellum bilaterally. Cerebral hemispheres show moderate chronic small-vessel ischemic changes of the deep white matter. No large vessel territory infarction. No mass lesion, hemorrhage, hydrocephalus or extra-axial collection. No pituitary mass. Vascular: Major vessels at the base of the brain show flow. Skull and upper cervical spine: Negative Sinuses/Orbits: Clear/normal Other: None significant IMPRESSION: No acute finding. 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 Moderate chronic small-vessel ischemic changes of the cerebral hemispheric white matter. No specific cause of seizure identified. These results will be called to the ordering clinician or representative by the Radiologist Assistant, and communication documented in the PACS or zVision Dashboard. Electronically Signed   By: Paulina Fusi M.D.   On: 12/18/2016 20:56   Dg Chest Port 1 View  Result Date: 12/18/2016 CLINICAL DATA:  Cough. EXAM: PORTABLE CHEST 1 VIEW COMPARISON:  12/12/2016 FINDINGS: Borderline heart size with normal pulmonary vascularity. Patchy airspace infiltrates are demonstrated in the right upper lung and left perihilar region as well as in the lung bases. There is progression since previous study. This likely represents multifocal pneumonia. Edema less likely. No blunting of costophrenic angles. No pneumothorax. Tortuous aorta. Degenerative changes in the spine. IMPRESSION: Increasing airspace disease in both lungs suggesting multifocal pneumonia. Electronically Signed   By: Burman Nieves M.D.   On: 12/18/2016 00:23      Assessment & Plan: Ms. MALAIAH VIRAMONTES is a 76 y.o. white female with diabetes mellitus type II, hypertension, peripheral vascular disease, obstructive sleep apnea, congestive heart  failure, anemia, hypothyroidism, sjogren's, hyperlipidemia, seizure disorder and questionable lupus, who was admitted to [facility] on 12/23/2016 for   1. Acute renal failure on chronic kidney disease stage IV with proteinuria: baseline creatinine of 1.77, GFR of 27 on admission 2/8.  Acute renal failure seems multifactorial. ATN, hypotension, concurrent illnesses.  Chronic kidney disease secondary to hypertension and diabetic nephropathy.  - Discontinue IV fluids due to pulmonary edema on examination - Place foley catheter.  - Agree with IV furosemide.  - Check renal ultrasound, urinalysis and spot urine protein to creatinine ratio  2. Pneumonia and acute exacerbation of COPD: currently on nonrebreather - BiPAP overnight - empiric antibiotics.   3. Hypertension and concern for acute exacerbation of congestive heart failure.  - holding amlodipine - IV furosemide - echocardiogram pending.   4. Diabetes mellitus type II with chronic kidney disease: continue glucose control.   LOS: 3 Heriberto Stmartin 2/11/201812:24 PM

## 2016-12-19 NOTE — Consult Note (Signed)
Reason for Consult:Shaking episodes Referring Physician: Imogene Burn  CC: Shaking episodes  HPI: Chloe Sanchez is an 76 y.o. female with multiple medical problems who due to mental status is unable to provide any history today.  Family not available so all history obtained from the chart.  Patient admitted on 2/8 with SOB, weakness and worsening cough.  Patient was bradycardic and found to have PNA.  Patient treated with Rocephin and Azithromycin.  On night of 2/9 patient found to have tremors.  Did seem to acknowledge staff based on notes during episode.  HR fluctuating, O2 sats in the 80's.  CXR was worse and patient was started on Vanc and Zosyn.  Last evening patient had more shaking episodes.  Per RR patient with contracted limbs but able to remain alert and talk throughout the event.  Was oriented to everything but date.  Was incontinent at CT.    Past Medical History:  Diagnosis Date  . Anemia   . Anxiety   . Arthritis   . CHF (congestive heart failure) (HCC)   . Chronic kidney disease    CKD  . Depression   . Diabetes mellitus without complication (HCC)   . Gout   . Hypertension   . Hypothyroidism   . On supplemental oxygen therapy    AT NIGHT  . Polyneuropathy (HCC)   . RLS (restless legs syndrome)   . Sjogren's syndrome (HCC)   . Sleep apnea     Past Surgical History:  Procedure Laterality Date  . BACK SURGERY     lumbar  . CHOLECYSTECTOMY    . FOOT SURGERY Bilateral   . TOTAL KNEE ARTHROPLASTY Right 07/17/2015   Procedure: TOTAL KNEE ARTHROPLASTY;  Surgeon: Kennedy Bucker, MD;  Location: [facility] ORS;  Service: Orthopedics;  Laterality: Right;    Family History  Problem Relation Age of Onset  . Lung cancer Sister     Social History:  reports that she has never smoked. She quit smokeless tobacco use about 17 months ago. Her smokeless tobacco use included Snuff. She reports that she does not drink alcohol or use drugs.  No Known Allergies  Medications:  I have reviewed the  patient's current medications. Prior to Admission:  Prescriptions Prior to Admission  Medication Sig Dispense Refill Last Dose  . albuterol (PROVENTIL HFA;VENTOLIN HFA) 108 (90 BASE) MCG/ACT inhaler Inhale 2 puffs into the lungs every 4 (four) hours as needed for wheezing or shortness of breath.   prn at prn  . allopurinol (ZYLOPRIM) 100 MG tablet Take 100 mg by mouth daily.   01/03/2017 at 0800  . amitriptyline (ELAVIL) 50 MG tablet Take 50 mg by mouth at bedtime.   12/15/2016 at 2000  . amLODipine-atorvastatin (CADUET) 5-10 MG per tablet Take 1 tablet by mouth daily.   12/13/2016 at 0800  . aspirin 81 MG tablet Take 81 mg by mouth daily.   12/28/2016 at 0800  . Carboxymethylcellul-Glycerin (OPTIVE) 0.5-0.9 % SOLN Apply 1-2 drops to eye 3 (three) times daily as needed.   prn at prn  . colchicine 0.6 MG tablet Take 0.6 mg by mouth daily. 2 BY MOUTH FIRST SIGN OF GOUT THEN 1 DAILY AS NEEDED   prn at prn  . ergocalciferol (VITAMIN D2) 50000 UNITS capsule Take 50,000 Units by mouth every 30 (thirty) days.   Past Month at Unknown time  . esomeprazole (NEXIUM) 40 MG capsule Take 40 mg by mouth daily at 12 noon. AM   12/28/2016 at 0800  . ferrous  sulfate 325 (65 FE) MG tablet Take 325 mg by mouth daily with breakfast.   2017/01/09 at 0800  . gabapentin (NEURONTIN) 600 MG tablet Take 600 mg by mouth 2 (two) times daily.    Jan 09, 2017 at 0800  . guaiFENesin-dextromethorphan (ROBITUSSIN DM) 100-10 MG/5ML syrup Take 5 mLs by mouth every 4 (four) hours as needed for cough.   prn at prn  . levothyroxine (SYNTHROID, LEVOTHROID) 88 MCG tablet Take 88 mcg by mouth daily before breakfast.   01/09/17 at 0730  . methadone (DOLOPHINE) 10 MG tablet Take 1 tablet (10 mg total) by mouth daily with lunch. (Patient taking differently: Take 10 mg by mouth 3 (three) times daily. 1 tablet at 0800, 1 tablet at 1330, 2 tablets at 2100) 30 tablet 0   . polyethylene glycol (MIRALAX / GLYCOLAX) packet Take 17 g by mouth daily.   prn at prn  .  pregabalin (LYRICA) 150 MG capsule Take 1 capsule (150 mg total) by mouth 2 (two) times daily. 30 capsule 0 2017-01-09 at 0800  . rOPINIRole (REQUIP) 1 MG tablet Take 1 mg by mouth 2 (two) times daily. 0800, 1930   09-Jan-2017 at 0800  . tiotropium (SPIRIVA) 18 MCG inhalation capsule Place 18 mcg into inhaler and inhale daily.   2017/01/09 at 0800  . trolamine salicylate (ASPERCREME) 10 % cream Apply 1 application topically 3 (three) times daily as needed for muscle pain.   prn at prn  . methadone (DOLOPHINE) 10 MG tablet Take 3 tablets (30 mg total) by mouth daily with breakfast. (Patient not taking: Reported on 2017-01-09) 30 tablet 0 Not Taking at Unknown time   Scheduled: . allopurinol  100 mg Oral Daily  . amitriptyline  50 mg Oral QHS  . aspirin  325 mg Oral Daily  . atorvastatin  40 mg Oral q1800  . Chlorhexidine Gluconate Cloth  6 each Topical Q0600  . ferrous sulfate  325 mg Oral Q breakfast  . fluticasone  1 spray Each Nare Daily  . furosemide  20 mg Intravenous BID  . gabapentin  600 mg Oral TID  . heparin  5,000 Units Subcutaneous Q8H  . insulin aspart  0-9 Units Subcutaneous TID WC  . ipratropium-albuterol  3 mL Nebulization TID  . levothyroxine  88 mcg Oral QAC breakfast  . loratadine  10 mg Oral Daily  . methadone  10 mg Oral BID  . methadone  20 mg Oral QHS  . methylPREDNISolone (SOLU-MEDROL) injection  60 mg Intravenous Q6H  . montelukast  10 mg Oral QHS  . mupirocin ointment  1 application Nasal BID  . pantoprazole  40 mg Oral Daily  . piperacillin-tazobactam (ZOSYN)  IV  3.375 g Intravenous Q12H  . pregabalin  150 mg Oral BID  . tiotropium  18 mcg Inhalation Daily  . vancomycin  1,000 mg Intravenous Q36H    ROS: Patient unable to provide due to mental status  Physical Examination: Blood pressure (!) 109/51, pulse 91, temperature 97.8 F (36.6 C), temperature source Oral, resp. rate (!) 22, height 5\' 2"  (1.575 m), weight 63 kg (138 lb 14.4 oz), SpO2 93 %.  HEENT-   Normocephalic, no lesions, without obvious abnormality.  Normal external eye and conjunctiva.  Normal TM's bilaterally.  Normal auditory canals and external ears. Normal external nose, mucus membranes and septum.  Normal pharynx. Cardiovascular- S1, S2 normal, pulses palpable throughout   Lungs- chest clear, no wheezing, rales, normal symmetric air entry Abdomen- soft, non-tender; bowel sounds normal; no  masses,  no organomegaly Extremities- no edema Lymph-no adenopathy palpable Musculoskeletal-no joint tenderness, deformity or swelling Skin-Miultiple scars due to past surgeries  Neurological Examination  Mental Status: Patient obtunded.  Able to be awakened but goes right back to sleep.  Knows where she is/who she is/how old she is and can follow simple commands. Cranial Nerves: II: patient does not respond confrontation bilaterally, pupils right 3 mm, left 3 mm,and reactive bilaterally III,IV,VI: doll's response absent bilaterally. V,VII: corneal reflex present bilaterally  VIII: grossly intact IX,X: gag reflex present, XI: trapezius strength unable to test bilaterally XII: tongue kidlinet Motor: Moves all extremities against gravity with no focal weakness noted.  Tremor noted.   Sensory: Responds to noxious stimuli in all extremities. Deep Tendon Reflexes:  1+ in the upper extremities and absent in the lower extremities Plantars: Mute bilaterally Cerebellar: Unable to perform    Laboratory Studies:   Basic Metabolic Panel:  Recent Labs Lab 01/04/2017 1613 12/17/16 0649 12/18/16 0753 Mar 02, 2017 0514  NA 140 140 136 136  K 4.2 3.9 4.4 5.0  CL 108 110 106 107  CO2 22 22 24  20*  GLUCOSE 128* 95 103* 114*  BUN 30* 27* 33* 38*  CREATININE 1.77* 1.51* 2.47* 2.65*  CALCIUM 9.1 8.7* 8.5* 8.6*  MG  --   --   --  1.7    Liver Function Tests: No results for input(s): AST, ALT, ALKPHOS, BILITOT, PROT, ALBUMIN in the last 168 hours. No results for input(s): LIPASE, AMYLASE in  the last 168 hours. No results for input(s): AMMONIA in the last 168 hours.  CBC:  Recent Labs Lab 12/09/2016 1613 12/17/16 0649 12/18/16 0753 Mar 02, 2017 0514  WBC 8.6 7.5 7.8 11.9*  HGB 9.1* 8.5* 7.8* 8.2*  HCT 26.8* 25.8* 23.1* 24.9*  MCV 89.8 90.9 89.8 89.7  PLT 295 274 258 288    Cardiac Enzymes:  Recent Labs Lab 12/11/2016 1613 Mar 02, 2017 0514  TROPONINI <0.03 0.32*    BNP: Invalid input(s): POCBNP  CBG:  Recent Labs Lab 12/18/16 1211 12/18/16 1651 12/18/16 2127 Mar 02, 2017 0733 Mar 02, 2017 1148  GLUCAP 121* 91 129* 96 161*    Microbiology: Results for orders placed or performed during the hospital encounter of 12/22/2016  MRSA PCR Screening     Status: Abnormal   Collection Time: 12/28/2016 10:51 PM  Result Value Ref Range Status   MRSA by PCR POSITIVE (A) NEGATIVE Final    Comment:        The GeneXpert MRSA Assay (FDA approved for NASAL specimens only), is one component of a comprehensive MRSA colonization surveillance program. It is not intended to diagnose MRSA infection nor to guide or monitor treatment for MRSA infections. RESULT CALLED TO, READ BACK BY AND VERIFIED WITH: CYNTHIA BURGESS @ 0021 ON 12/17/2016 BY CAF     Coagulation Studies: No results for input(s): LABPROT, INR in the last 72 hours.  Urinalysis: No results for input(s): COLORURINE, LABSPEC, PHURINE, GLUCOSEU, HGBUR, BILIRUBINUR, KETONESUR, PROTEINUR, UROBILINOGEN, NITRITE, LEUKOCYTESUR in the last 168 hours.  Invalid input(s): APPERANCEUR  Lipid Panel:     Component Value Date/Time   CHOL 108 2017/08/06 0514   TRIG 74 2017/08/06 0514   HDL 43 2017/08/06 0514   CHOLHDL 2.5 2017/08/06 0514   VLDL 15 2017/08/06 0514   LDLCALC 50 2017/08/06 0514    HgbA1C: No results found for: HGBA1C  Urine Drug Screen:  No results found for: LABOPIA, COCAINSCRNUR, LABBENZ, AMPHETMU, THCU, LABBARB  Alcohol Level: No results for input(s): ETH in the  last 168 hours.  Other results: EKG:  junctional rhythm at 52 bpm.  Imaging: Ct Head Wo Contrast  Result Date: 12/18/2016 CLINICAL DATA:  Acute onset of stroke-like symptoms. Difficulty with speech, and possible seizure like activity. Initial encounter. EXAM: CT HEAD WITHOUT CONTRAST TECHNIQUE: Contiguous axial images were obtained from the base of the skull through the vertex without intravenous contrast. COMPARISON:  CT of the head performed 12/08/2016 FINDINGS: Brain: No evidence of acute infarction, hemorrhage, hydrocephalus, extra-axial collection or mass lesion/mass effect. Prominence of the sulci suggests mild cortical volume loss. Mild cerebellar atrophy is noted. Mild periventricular white matter change likely reflects small vessel ischemic microangiopathy. The brainstem and fourth ventricle are within normal limits. The basal ganglia are unremarkable in appearance. The cerebral hemispheres demonstrate grossly normal gray-white differentiation. No mass effect or midline shift is seen. Vascular: No hyperdense vessel or unexpected calcification. Skull: There is no evidence of fracture; visualized osseous structures are unremarkable in appearance. Sinuses/Orbits: The visualized portions of the orbits are within normal limits. The paranasal sinuses and mastoid air cells are well-aerated. Other: No significant soft tissue abnormalities are seen. IMPRESSION: 1. No acute intracranial pathology seen on CT. 2. Mild cortical volume loss and scattered small vessel ischemic microangiopathy. Electronically Signed   By: Roanna Raider M.D.   On: 12/18/2016 17:44   Mr Brain Wo Contrast  Result Date: 12/18/2016 CLINICAL DATA:  Episode of seizure like activity. Slurred speech. Tremors. Disorientation. EXAM: MRI HEAD WITHOUT CONTRAST TECHNIQUE: Multiplanar, multiecho pulse sequences of the brain and surrounding structures were obtained without intravenous contrast. COMPARISON:  Head CT same day.  Head CT 12/08/2016. FINDINGS: Brain: Diffusion imaging  does not show any acute or subacute infarction. The study does suffer from considerable motion degradation. The brainstem is negative. There are old small vessel infarctions affecting the cerebellum bilaterally. Cerebral hemispheres show moderate chronic small-vessel ischemic changes of the deep white matter. No large vessel territory infarction. No mass lesion, hemorrhage, hydrocephalus or extra-axial collection. No pituitary mass. Vascular: Major vessels at the base of the brain show flow. Skull and upper cervical spine: Negative Sinuses/Orbits: Clear/normal Other: None significant IMPRESSION: No acute finding. 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 Moderate chronic small-vessel ischemic changes of the cerebral hemispheric white matter. No specific cause of seizure identified. These results will be called to the ordering clinician or representative by the Radiologist Assistant, and communication documented in the PACS or zVision Dashboard. Electronically Signed   By: Paulina Fusi M.D.   On: 12/18/2016 20:56   Dg Chest Port 1 View  Result Date: 12/18/2016 CLINICAL DATA:  Cough. EXAM: PORTABLE CHEST 1 VIEW COMPARISON:  12/21/2016 FINDINGS: Borderline heart size with normal pulmonary vascularity. Patchy airspace infiltrates are demonstrated in the right upper lung and left perihilar region as well as in the lung bases. There is progression since previous study. This likely represents multifocal pneumonia. Edema less likely. No blunting of costophrenic angles. No pneumothorax. Tortuous aorta. Degenerative changes in the spine. IMPRESSION: Increasing airspace disease in both lungs suggesting multifocal pneumonia. Electronically Signed   By: Burman Nieves M.D.   On: 12/18/2016 00:23     Assessment/Plan: 76 year old female presenting with PNA now with shaking episodes.  Patient with multiple metabolic issues including PNA, acute on chronic renal disease, hypoxia, low grade temp and elevated  white blood cell count.  In face of renal issues patient also on Neurontin and Lyrica.  From description of events per chart episodes do not appear consistent  with a generalized seizure since patient remained conversant.  May be related to metabolic issues.  Also patient had acutely been off Methadone and may have been experiencing some withdrawal.  Head CT and MRI reviewed and shows no acute changes.    Recommendation: 1.  D/C Neurontin 2.  EEG 3.  Seizure precautions but would not start anticonvulsant therapy at this time.  Ativan prn if necessary. 4.  Agree with addressing medical issues   Thana Farr, MD Neurology [phone] 12/24/16, 12:36 PM

## 2016-12-19 NOTE — Progress Notes (Signed)
Pt enroute with team to CCU.

## 2016-12-19 NOTE — Progress Notes (Signed)
eLink Physician-Brief Progress Note Patient Name: Chloe Sanchez DOB: Feb 02, 1941 MRN: [id number]   Date of Service  12/11/2016  HPI/Events of Note  Evening electrolyte panel reviewed. Glucose 246. Patient currently nothing by mouth on mechanical ventilation. Creatinine rising at 3.44. Potassium 4.1.   eICU Interventions  1. Continuing to monitor urine output 2. Continuing to monitor electrolytes and renal function with a.m. labs 3. Continuing sliding scale insulin with Accu-Cheks every 4 hours and low-dose sliding scale coverage      Intervention Category Intermediate Interventions: Hyperglycemia - evaluation and treatment  Lawanda CousinsJennings Alyria Krack 12/18/2016, 6:55 PM

## 2016-12-20 ENCOUNTER — Inpatient Hospital Stay: Payer: Medicare HMO

## 2016-12-20 ENCOUNTER — Encounter: Payer: Self-pay | Admitting: Cardiovascular Disease

## 2016-12-20 DIAGNOSIS — J9690 Respiratory failure, unspecified, unspecified whether with hypoxia or hypercapnia: Secondary | ICD-10-CM

## 2016-12-20 DIAGNOSIS — N183 Chronic kidney disease, stage 3 (moderate): Secondary | ICD-10-CM

## 2016-12-20 DIAGNOSIS — R569 Unspecified convulsions: Secondary | ICD-10-CM

## 2016-12-20 DIAGNOSIS — J9602 Acute respiratory failure with hypercapnia: Secondary | ICD-10-CM

## 2016-12-20 DIAGNOSIS — R570 Cardiogenic shock: Secondary | ICD-10-CM

## 2016-12-20 DIAGNOSIS — A419 Sepsis, unspecified organism: Secondary | ICD-10-CM

## 2016-12-20 DIAGNOSIS — N179 Acute kidney failure, unspecified: Secondary | ICD-10-CM

## 2016-12-20 DIAGNOSIS — R6521 Severe sepsis with septic shock: Secondary | ICD-10-CM

## 2016-12-20 LAB — COMPREHENSIVE METABOLIC PANEL
ALT: 74 U/L — AB (ref 14–54)
AST: 136 U/L — AB (ref 15–41)
Albumin: 2.5 g/dL — ABNORMAL LOW (ref 3.5–5.0)
Alkaline Phosphatase: 152 U/L — ABNORMAL HIGH (ref 38–126)
Anion gap: 13 (ref 5–15)
BILIRUBIN TOTAL: 0.8 mg/dL (ref 0.3–1.2)
BUN: 53 mg/dL — AB (ref 6–20)
CHLORIDE: 100 mmol/L — AB (ref 101–111)
CO2: 20 mmol/L — ABNORMAL LOW (ref 22–32)
CREATININE: 3.54 mg/dL — AB (ref 0.44–1.00)
Calcium: 8 mg/dL — ABNORMAL LOW (ref 8.9–10.3)
GFR calc Af Amer: 14 mL/min — ABNORMAL LOW (ref >60)
GFR, EST NON AFRICAN AMERICAN: 12 mL/min — AB (ref >60)
Glucose, Bld: 600 mg/dL (ref 65–99)
Potassium: 3.8 mmol/L (ref 3.5–5.1)
Sodium: 133 mmol/L — ABNORMAL LOW (ref 135–145)
Total Protein: 6.3 g/dL — ABNORMAL LOW (ref 6.5–8.1)

## 2016-12-20 LAB — CBC
HCT: 29.2 % — ABNORMAL LOW (ref 35.0–47.0)
HEMOGLOBIN: 9.5 g/dL — AB (ref 12.0–16.0)
MCH: 30 pg (ref 26.0–34.0)
MCHC: 32.6 g/dL (ref 32.0–36.0)
MCV: 92 fL (ref 80.0–100.0)
PLATELETS: 332 10*3/uL (ref 150–440)
RBC: 3.18 MIL/uL — ABNORMAL LOW (ref 3.80–5.20)
RDW: 14.8 % — AB (ref 11.5–14.5)
WBC: 20.3 10*3/uL — ABNORMAL HIGH (ref 3.6–11.0)

## 2016-12-20 LAB — BLOOD GAS, ARTERIAL
ACID-BASE DEFICIT: 1.8 mmol/L (ref 0.0–2.0)
Bicarbonate: 25.4 mmol/L (ref 20.0–28.0)
FIO2: 0.7
O2 Saturation: 99.3 %
PCO2 ART: 54 mmHg — AB (ref 32.0–48.0)
PEEP: 8 cmH2O
Patient temperature: 37
RATE: 14 resp/min
VT: 450 mL
pH, Arterial: 7.28 — ABNORMAL LOW (ref 7.350–7.450)
pO2, Arterial: 163 mmHg — ABNORMAL HIGH (ref 83.0–108.0)

## 2016-12-20 LAB — MAGNESIUM
MAGNESIUM: 1.9 mg/dL (ref 1.7–2.4)
MAGNESIUM: 1.9 mg/dL (ref 1.7–2.4)

## 2016-12-20 LAB — GLUCOSE, CAPILLARY
GLUCOSE-CAPILLARY: 138 mg/dL — AB (ref 65–99)
GLUCOSE-CAPILLARY: 170 mg/dL — AB (ref 65–99)
GLUCOSE-CAPILLARY: 444 mg/dL — AB (ref 65–99)
GLUCOSE-CAPILLARY: 445 mg/dL — AB (ref 65–99)
GLUCOSE-CAPILLARY: 453 mg/dL — AB (ref 65–99)
GLUCOSE-CAPILLARY: 75 mg/dL (ref 65–99)
GLUCOSE-CAPILLARY: 85 mg/dL (ref 65–99)
GLUCOSE-CAPILLARY: 95 mg/dL (ref 65–99)
Glucose-Capillary: 111 mg/dL — ABNORMAL HIGH (ref 65–99)
Glucose-Capillary: 125 mg/dL — ABNORMAL HIGH (ref 65–99)
Glucose-Capillary: 207 mg/dL — ABNORMAL HIGH (ref 65–99)
Glucose-Capillary: 334 mg/dL — ABNORMAL HIGH (ref 65–99)
Glucose-Capillary: 430 mg/dL — ABNORMAL HIGH (ref 65–99)
Glucose-Capillary: 447 mg/dL — ABNORMAL HIGH (ref 65–99)
Glucose-Capillary: 486 mg/dL — ABNORMAL HIGH (ref 65–99)
Glucose-Capillary: 491 mg/dL — ABNORMAL HIGH (ref 65–99)
Glucose-Capillary: 51 mg/dL — ABNORMAL LOW (ref 65–99)
Glucose-Capillary: 58 mg/dL — ABNORMAL LOW (ref 65–99)
Glucose-Capillary: 80 mg/dL (ref 65–99)

## 2016-12-20 LAB — PROCALCITONIN: PROCALCITONIN: 59.38 ng/mL

## 2016-12-20 LAB — LACTIC ACID, PLASMA: Lactic Acid, Venous: 2.4 mmol/L (ref 0.5–1.9)

## 2016-12-20 LAB — PHOSPHORUS: Phosphorus: 7.3 mg/dL — ABNORMAL HIGH (ref 2.5–4.6)

## 2016-12-20 LAB — BASIC METABOLIC PANEL
Anion gap: 11 (ref 5–15)
BUN: 58 mg/dL — ABNORMAL HIGH (ref 6–20)
CO2: 26 mmol/L (ref 22–32)
CREATININE: 3.48 mg/dL — AB (ref 0.44–1.00)
Calcium: 7.9 mg/dL — ABNORMAL LOW (ref 8.9–10.3)
Chloride: 99 mmol/L — ABNORMAL LOW (ref 101–111)
GFR calc Af Amer: 14 mL/min — ABNORMAL LOW (ref >60)
GFR, EST NON AFRICAN AMERICAN: 12 mL/min — AB (ref >60)
GLUCOSE: 619 mg/dL — AB (ref 65–99)
POTASSIUM: 3.4 mmol/L — AB (ref 3.5–5.1)
SODIUM: 136 mmol/L (ref 135–145)

## 2016-12-20 LAB — VANCOMYCIN, RANDOM: Vancomycin Rm: 20

## 2016-12-20 LAB — CK: CK TOTAL: 40 U/L (ref 38–234)

## 2016-12-20 LAB — PREPARE RBC (CROSSMATCH)

## 2016-12-20 MED ORDER — FAMOTIDINE IN NACL 20-0.9 MG/50ML-% IV SOLN
20.0000 mg | INTRAVENOUS | Status: DC
  Administered 2016-12-20 – 2016-12-28 (×9): 20 mg via INTRAVENOUS
  Filled 2016-12-20 (×9): qty 50

## 2016-12-20 MED ORDER — SODIUM CHLORIDE 0.9% FLUSH: 10.0000 mL | INTRAVENOUS | Status: DC | PRN

## 2016-12-20 MED ORDER — INSULIN ASPART 100 UNIT/ML ~~LOC~~ SOLN
15.0000 [IU] | Freq: Once | SUBCUTANEOUS | Status: AC
  Administered 2016-12-20: 15 [IU] via SUBCUTANEOUS

## 2016-12-20 MED ORDER — SODIUM BICARBONATE 8.4 % IV SOLN
150.0000 meq | Freq: Once | INTRAVENOUS | Status: AC
  Administered 2016-12-20: 150 meq via INTRAVENOUS

## 2016-12-20 MED ORDER — FUROSEMIDE 10 MG/ML IJ SOLN
20.0000 mg | Freq: Once | INTRAMUSCULAR | Status: AC
  Administered 2016-12-20: 20 mg via INTRAVENOUS

## 2016-12-20 MED ORDER — SODIUM CHLORIDE 0.9 % IV SOLN
INTRAVENOUS | Status: DC
  Administered 2016-12-20: 10 mL/h via INTRAVENOUS

## 2016-12-20 MED ORDER — SODIUM CHLORIDE 0.9 % IV SOLN
INTRAVENOUS | Status: DC
  Administered 2016-12-20: 4.3 [IU]/h via INTRAVENOUS
  Filled 2016-12-20: qty 2.5

## 2016-12-20 MED ORDER — INSULIN ASPART 100 UNIT/ML ~~LOC~~ SOLN
0.0000 [IU] | SUBCUTANEOUS | Status: DC
  Administered 2016-12-21 – 2016-12-23 (×4): 1 [IU] via SUBCUTANEOUS
  Administered 2016-12-23: 2 [IU] via SUBCUTANEOUS
  Administered 2016-12-23: 1 [IU] via SUBCUTANEOUS
  Administered 2016-12-23: 2 [IU] via SUBCUTANEOUS
  Administered 2016-12-23 – 2016-12-24 (×2): 1 [IU] via SUBCUTANEOUS
  Administered 2016-12-24: 2 [IU] via SUBCUTANEOUS
  Administered 2016-12-24 (×2): 1 [IU] via SUBCUTANEOUS
  Administered 2016-12-25: 2 [IU] via SUBCUTANEOUS
  Administered 2016-12-25 (×3): 1 [IU] via SUBCUTANEOUS
  Administered 2016-12-26: 2 [IU] via SUBCUTANEOUS
  Administered 2016-12-26: 1 [IU] via SUBCUTANEOUS
  Administered 2016-12-26 – 2016-12-27 (×2): 2 [IU] via SUBCUTANEOUS
  Administered 2016-12-27: 1 [IU] via SUBCUTANEOUS
  Administered 2016-12-27: 3 [IU] via SUBCUTANEOUS
  Administered 2016-12-27 (×2): 1 [IU] via SUBCUTANEOUS
  Administered 2016-12-28: 2 [IU] via SUBCUTANEOUS
  Filled 2016-12-20 (×9): qty 1
  Filled 2016-12-20: qty 2
  Filled 2016-12-20 (×3): qty 1
  Filled 2016-12-20 (×2): qty 2
  Filled 2016-12-20: qty 1
  Filled 2016-12-20 (×2): qty 2
  Filled 2016-12-20 (×3): qty 1
  Filled 2016-12-20 (×3): qty 2
  Filled 2016-12-20: qty 1

## 2016-12-20 MED ORDER — BLISTEX MEDICATED EX OINT
TOPICAL_OINTMENT | CUTANEOUS | Status: DC | PRN
  Filled 2016-12-20: qty 6.3

## 2016-12-20 MED ORDER — POTASSIUM CHLORIDE 20 MEQ PO PACK
20.0000 meq | PACK | Freq: Once | ORAL | Status: AC
  Administered 2016-12-20: 20 meq via ORAL
  Filled 2016-12-20: qty 1

## 2016-12-20 MED ORDER — INSULIN GLARGINE 100 UNIT/ML ~~LOC~~ SOLN
5.0000 [IU] | Freq: Every day | SUBCUTANEOUS | Status: DC
  Administered 2016-12-20 – 2016-12-21 (×2): 5 [IU] via SUBCUTANEOUS
  Filled 2016-12-20 (×3): qty 0.05

## 2016-12-20 MED ORDER — STERILE WATER FOR INJECTION IJ SOLN
INTRAMUSCULAR | Status: AC
  Filled 2016-12-20: qty 10

## 2016-12-20 MED ORDER — CHLORHEXIDINE GLUCONATE 0.12% ORAL RINSE (MEDLINE KIT)
15.0000 mL | Freq: Two times a day (BID) | OROMUCOSAL | Status: DC
  Administered 2016-12-20 – 2016-12-21 (×3): 15 mL via OROMUCOSAL

## 2016-12-20 MED ORDER — DEXTROSE 50 % IV SOLN: 25.0000 mL | INTRAVENOUS | Status: DC | PRN

## 2016-12-20 MED ORDER — INSULIN REGULAR BOLUS VIA INFUSION
0.0000 [IU] | Freq: Three times a day (TID) | INTRAVENOUS | Status: DC
  Filled 2016-12-20: qty 10

## 2016-12-20 MED ORDER — IPRATROPIUM-ALBUTEROL 0.5-2.5 (3) MG/3ML IN SOLN
3.0000 mL | Freq: Four times a day (QID) | RESPIRATORY_TRACT | Status: DC
  Administered 2016-12-20 – 2016-12-28 (×32): 3 mL via RESPIRATORY_TRACT
  Filled 2016-12-20 (×34): qty 3

## 2016-12-20 MED ORDER — SODIUM CHLORIDE 0.9% FLUSH
10.0000 mL | Freq: Two times a day (BID) | INTRAVENOUS | Status: DC
  Administered 2016-12-20 – 2016-12-21 (×3): 10 mL
  Administered 2016-12-21: 20 mL
  Administered 2016-12-22 – 2016-12-26 (×8): 10 mL
  Administered 2016-12-26: 40 mL
  Administered 2016-12-27 – 2016-12-28 (×3): 10 mL

## 2016-12-20 MED ORDER — ORAL CARE MOUTH RINSE
15.0000 mL | OROMUCOSAL | Status: DC
  Administered 2016-12-20 – 2016-12-21 (×16): 15 mL via OROMUCOSAL

## 2016-12-20 MED ORDER — DEXTROSE 50 % IV SOLN
INTRAVENOUS | Status: AC
  Administered 2016-12-20: 17 mL
  Filled 2016-12-20: qty 50

## 2016-12-20 MED FILL — Medication: Qty: 1 | Status: AC

## 2016-12-20 NOTE — Progress Notes (Signed)
Spoke with patient's son Romeo AppleBen hall regarding patient's condition-requiring more pressures, shock and overall prognosis. He indicated that patient had clearly communicated her wishes and they don''t want her to suffer. He agree to make patient a DNR. We will continue with the current treatment plan and re-evaluate if patient makes for the next 24-48 hrs.  All questions answered and support provided to the two sons and daughter Will continue to monitor and update  Elizet Kaplan S. [facility] ANP-BC Pulmonary and Critical Care Medicine [facility] Pager [phone] or [phone]

## 2016-12-20 NOTE — Progress Notes (Signed)
Pharmacy Antibiotic Note  Chloe Sanchez is a 76 y.o. female admitted on 12/10/2016 with pneumonia.  Pharmacy has been consulted for vancomycin and cefepime dosing.  Plan: Patient in ARF with no plans for dialysis per nephrology. Vancomycin random level yesterday of 9 mcg/ml. Subsequent to vancomycin level patient received 2 doses of vancomycin 1000 mg. SCr also not improving. Will check a random vancomycin level now and reassess dosing.   Continue cefepime 2 g iv q 24 hours.   Height: 5\' 2"  (157.5 cm) Weight: 138 lb 14.4 oz (63 kg) IBW/kg (Calculated) : 50.1  Temp (24hrs), Avg:97.7 F (36.5 C), Min:96.3 F (35.7 C), Max:98.5 F (36.9 C)   Recent Labs Lab 12/17/16 0649 12/18/16 0753 21-Dec-2016 0514 December 21, 2016 1343 December 21, 2016 1819 12-21-2016 2135 21-Dec-2016 2140 12-21-16 2351 12/20/16 0416 12/20/16 0421  WBC 7.5 7.8 11.9*  --   --  19.0*  --   --  20.3*  --   CREATININE 1.51* 2.47* 2.65*  --  3.44*  --   --  3.54* 3.48*  --   LATICACIDVEN  --   --   --   --   --   --  3.9*  --   --  2.4*  VANCORANDOM  --   --   --  9  --   --   --   --   --   --     Estimated Creatinine Clearance: 12.2 mL/min (by C-G formula based on SCr of 3.48 mg/dL (H)).    No Known Allergies  Anti-infectives    Start     Dose/Rate Route Frequency Ordered Stop   December 21, 2016 2200  ceFEPIme (MAXIPIME) 2 g in dextrose 5 % 50 mL IVPB     2 g 100 mL/hr over 30 Minutes Intravenous Every 24 hours 2016-12-21 2111     2016/12/21 1800  vancomycin (VANCOCIN) IVPB 1000 mg/200 mL premix  Status:  Discontinued     1,000 mg 200 mL/hr over 60 Minutes Intravenous Every 36 hours 12/18/16 1151 12-21-16 1458   21-Dec-2016 1530  vancomycin (VANCOCIN) IVPB 1000 mg/200 mL premix     1,000 mg 200 mL/hr over 60 Minutes Intravenous Every 36 hours 2016-12-21 1458     12/18/16 2100  piperacillin-tazobactam (ZOSYN) IVPB 3.375 g  Status:  Discontinued     3.375 g 12.5 mL/hr over 240 Minutes Intravenous Every 12 hours 12/18/16 1149 December 21, 2016 2103   12/18/16 1800  vancomycin (VANCOCIN) IVPB 1000 mg/200 mL premix  Status:  Discontinued     1,000 mg 200 mL/hr over 60 Minutes Intravenous Every 36 hours 12/18/16 0053 12/18/16 1149   12/18/16 0200  piperacillin-tazobactam (ZOSYN) IVPB 3.375 g  Status:  Discontinued     3.375 g 12.5 mL/hr over 240 Minutes Intravenous Every 8 hours 12/18/16 0049 12/18/16 1149   12/18/16 0200  vancomycin (VANCOCIN) IVPB 1000 mg/200 mL premix     1,000 mg 200 mL/hr over 60 Minutes Intravenous  Once 12/18/16 0053 12/18/16 0301   12/17/16 1800  levofloxacin (LEVAQUIN) tablet 750 mg  Status:  Discontinued     750 mg Oral Every 48 hours 12/17/16 1621 12/18/16 0031   12/17/16 1000  cefTRIAXone (ROCEPHIN) IVPB 1 g  Status:  Discontinued     1 g 100 mL/hr over 30 Minutes Intravenous Every 24 hours 12/26/2016 1853 12/17/16 1600   12/13/2016 1900  cefTRIAXone (ROCEPHIN) 1 g in dextrose 5 % 50 mL IVPB  Status:  Discontinued     1 g 100 mL/hr  over 30 Minutes Intravenous Every 24 hours 12/10/2016 1849 12/25/2016 1853   12/22/2016 1900  azithromycin (ZITHROMAX) 250 mg in dextrose 5 % 125 mL IVPB  Status:  Discontinued     250 mg 125 mL/hr over 60 Minutes Intravenous Every 24 hours 12/09/2016 1849 12/17/16 1600   01/04/2017 1800  cefTRIAXone (ROCEPHIN) IVPB 1 g     1 g 100 mL/hr over 30 Minutes Intravenous  Once 01/01/2017 1746 12/09/2016 2316   01/03/2017 1745  cefTRIAXone (ROCEPHIN) 1 g in dextrose 5 % 50 mL IVPB  Status:  Discontinued     1 g 100 mL/hr over 30 Minutes Intravenous  Once 12/27/2016 1742 12/31/2016 1746   01/04/2017 1745  azithromycin (ZITHROMAX) 500 mg in dextrose 5 % 250 mL IVPB     500 mg 250 mL/hr over 60 Minutes Intravenous  Once 12/30/2016 1742 12/24/2016 1907     Recent Results (from the past 240 hour(s))  MRSA PCR Screening     Status: Abnormal   Collection Time: 12/28/2016 10:51 PM  Result Value Ref Range Status   MRSA by PCR POSITIVE (A) NEGATIVE Final    Comment:        The GeneXpert MRSA Assay (FDA approved for  NASAL specimens only), is one component of a comprehensive MRSA colonization surveillance program. It is not intended to diagnose MRSA infection nor to guide or monitor treatment for MRSA infections. RESULT CALLED TO, READ BACK BY AND VERIFIED WITH: CYNTHIA BURGESS @ 0021 ON 12/17/2016 BY CAF   Culture, blood (Routine X 2) w Reflex to ID Panel     Status: None (Preliminary result)   Collection Time: 12/18/2016  1:43 PM  Result Value Ref Range Status   Specimen Description BLOOD RIGHT ARM  Final   Special Requests   Final    BOTTLES DRAWN AEROBIC AND ANAEROBIC AER 6CC ANA 4CC   Culture NO GROWTH < 24 HOURS  Final   Report Status PENDING  Incomplete  Culture, blood (Routine X 2) w Reflex to ID Panel     Status: None (Preliminary result)   Collection Time: 01/04/2017  1:43 PM  Result Value Ref Range Status   Specimen Description BLOOD LEFT HAND  Final   Special Requests   Final    BOTTLES DRAWN AEROBIC AND ANAEROBIC AER 6CC ANA 7CC   Culture NO GROWTH < 24 HOURS  Final   Report Status PENDING  Incomplete    Thank you for allowing pharmacy to be a part of this patient's care.  Luisa HartChristy, Freddy Spadafora D 12/20/2016 12:54 PM

## 2016-12-20 NOTE — Progress Notes (Signed)
Pharmacy note  Electrolyte and glucose management  2/12 AM labs K+ 3.4. 20 mEq KCl x1 per tube ordered. Recheck BMP tomorrow AM.  Continue insulin drip per gluco stabilizer.

## 2016-12-20 NOTE — Progress Notes (Signed)
Pt CBG checked, resulted at 51, rechecked resulted 58. Insulin drip held. Pt nurse Brad notify, will given D50.

## 2016-12-20 NOTE — Consult Note (Signed)
[facility] Consultation    Name: Chloe Sanchez MRN: [id number] DOB: Dec 17, 1940    ADMISSION DATE:  12/27/2016 CONSULTATION DATE:  12/25/2016  REFERRING MD : Dr. Imogene Burnhen  CHIEF COMPLAINT:  Dyspnea.    SUBJ:   Vasopressor requirements improving. RASS -2. + F/C. Uo improving.   VITAL SIGNS: Temp:  [96.3 F (35.7 C)-98 F (36.7 C)] 97.9 F (36.6 C) (02/12 1200) Pulse Rate:  [61-75] 67 (02/12 1530) Resp:  [0-22] 18 (02/12 1530) BP: (71-154)/(36-119) 103/60 (02/12 1530) SpO2:  [92 %-100 %] 100 % (02/12 1609) FiO2 (%):  [50 %-100 %] 50 % (02/12 1609) HEMODYNAMICS: CVP:  [21 mmHg] 21 mmHg VENTILATOR SETTINGS: Vent Mode: PRVC FiO2 (%):  [50 %-100 %] 50 % Set Rate:  [14 bmp] 14 bmp Vt Set:  [450 mL] 450 mL PEEP:  [8 cmH20] 8 cmH20 INTAKE / OUTPUT:  Intake/Output Summary (Last 24 hours) at 12/20/16 1654 Last data filed at 12/20/16 1500  Gross per 24 hour  Intake          2386.22 ml  Output              478 ml  Net          1908.22 ml    Physical Examination:   VS: BP 103/60   Pulse 67   Temp 97.9 F (36.6 C) (Axillary)   Resp 18   Ht 5\' 2"  (1.575 m)   Wt 138 lb 14.4 oz (63 kg)   SpO2 100%   BMI 25.41 kg/m   General Appearance: No distress  Neuro:without focal findings, mental status, speech normal,. HEENT: PERRLA, EOM intact, no ptosis, no other lesions noticed;  Pulmonary: normal breath sounds., diaphragmatic excursion normal. CardiovascularNormal S1,S2. + systolic M.    Abdomen: Benign, Soft, non-tender, No masses, hepatosplenomegaly, No lymphadenopathy GU:  Not performed at this time. Endoc: No evident thyromegaly, no signs of acromegaly. Skin:   warm, no rashes, no ecchymosis  Extremities: normal, no cyanosis, clubbing, no edema, warm with normal capillary refill.    LABS: Reviewed   LABORATORY PANEL:   CBC  Recent Labs Lab 12/20/16 0416  WBC 20.3*  HGB 9.5*  HCT 29.2*  PLT 332    Chemistries   Recent Labs Lab  12/10/2016 2351 12/20/16 0416  NA 133* 136  K 3.8 3.4*  CL 100* 99*  CO2 20* 26  GLUCOSE 600* 619*  BUN 53* 58*  CREATININE 3.54* 3.48*  CALCIUM 8.0* 7.9*  MG 1.9 1.9  PHOS 7.3*  --   AST 136*  --   ALT 74*  --   ALKPHOS 152*  --   BILITOT 0.8  --      Recent Labs Lab 12/20/16 1240 12/20/16 1411 12/20/16 1414 12/20/16 1438 12/20/16 1545 12/20/16 1638  GLUCAP 80 51* 58* 95 75 85    Recent Labs Lab 12/17/2016 2036 01/03/2017 2350 12/20/16 0421  PHART 7.01* 7.17* 7.28*  PCO2ART 59* 52* 54*  PO2ART 225* 128* 163*    Recent Labs Lab 12/27/2016 2351  AST 136*  ALT 74*  ALKPHOS 152*  BILITOT 0.8  ALBUMIN 2.5*    Cardiac Enzymes  Recent Labs Lab 12/18/2016 1819  TROPONINI 0.17*    RADIOLOGY:  CXR: CM, edema pattern    ASSESSMENT/PLAN   [age] year old female with acute respiratory failure, with acute aspiration pneumonitis/pneumonia with severe pulmonary edema. 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  PULMONARY A:-Acute aspiration pneumonia/pneumonitis. -  Acute pulmonary edema, likely secondary to aortic stenosis. P:   -Continue antibiotic coverage. -Continue Lasix, will increase frequency. -Empiric steroids for aspiration pneumonitis.  CARDIOVASCULAR A: Third degree heart block with junctional escape rhythm. -Critical aortic stenosis. P:  -Discussed with Dr. Mariah Milling, will be following, patient may need temporary pacemaker. -Aortic stenosis, may need to be addressed after acute respiratory issues have improved.  RENAL A acute kidney injury. Suspect prerenal. P:   Continue diuresis.  GASTROINTESTINAL A:  -Nausea, vomiting. -Symptomatically treatment.  HEMATOLOGIC A:  --  INFECTIOUS A:  Aspiration pneumonia/pneumonitis. P:   -As above, continue antibiotics. -Check blood cultures. -MRSA screen positive, will start vancomycin. -Check blood cultures. -Urine cultures.  Micro/culture  results:  BCx2 -- UC -- Sputum-- MRSA screen 12/19/2016:  Antibiotics: Azithro 2/8 >>2/9 CTX 2/9 >>2/9 Levofloxacin 2/9>> Vancomycin 2/11>>  ENDOCRINE A:  Hypothyroidism.  gout P:   --Continue thyroid replacement.  --Stop allopurinol, colchicine.   NEUROLOGIC A:  Acute metabolic encephalopathy P:     CCM time: 45 mins The above time includes time spent in consultation with patient and/or family members and reviewing care plan on multidisciplinary rounds  Billy Fischer, MD PCCM service Mobile [phone] Pager [phone]   12/20/2016

## 2016-12-20 NOTE — Progress Notes (Signed)
Subjective:   Patient is critically ill intubated and sedated Vent dependent at present Temporary venous pacemaker in place Blood sugars are high (600's) S Cr high in mid 3.5 range UOP appears to have improved overnight Requiring vasopressors- Vasopressin Son at bedside  Objective:  Vital signs in last 24 hours:  Temp:  [96.3 F (35.7 C)-98.5 F (36.9 C)] 97.7 F (36.5 C) (02/12 0400) Pulse Rate:  [30-73] 70 (02/12 0600) Resp:  [0-22] 18 (02/12 0600) BP: (71-154)/(36-119) 126/57 (02/12 0600) SpO2:  [87 %-100 %] 98 % (02/12 0600) FiO2 (%):  [70 %-100 %] 70 % (02/12 0400)  Weight change:  Filed Weights   12/25/2016 1612 12/09/2016 2010  Weight: 63 kg (139 lb) 63 kg (138 lb 14.4 oz)    Intake/Output:    Intake/Output Summary (Last 24 hours) at 12/20/16 1004 Last data filed at 12/20/16 0600  Gross per 24 hour  Intake          2846.84 ml  Output              928 ml  Net          1918.84 ml     Physical Exam: General: Critically ill  HEENT ETT  Neck No masses  Pulm/lungs Vent assisted, Fio2 50%, coarse breath sounds  CVS/Heart Paced rhythm, 2/6 systolic murmur  Abdomen:  Soft, decreased BS  Extremities: No significant edema  Neurologic: sedated  Skin: No acute rashes   Foley present       Basic Metabolic Panel:   Recent Labs Lab 12/18/16 0753 12/09/2016 0514 12/15/2016 1819 12/18/2016 2351 12/20/16 0416  NA 136 136 136 133* 136  K 4.4 5.0 4.1 3.8 3.4*  CL 106 107 107 100* 99*  CO2 24 20* 18* 20* 26  GLUCOSE 103* 114* 246* 600* 619*  BUN 33* 38* 52* 53* 58*  CREATININE 2.47* 2.65* 3.44* 3.54* 3.48*  CALCIUM 8.5* 8.6* 8.9 8.0* 7.9*  MG  --  1.7  --  1.9 1.9  PHOS  --   --   --  7.3*  --      CBC:  Recent Labs Lab 12/17/16 0649 12/18/16 0753 12/28/2016 0514 12/16/2016 2135 12/20/16 0416  WBC 7.5 7.8 11.9* 19.0* 20.3*  HGB 8.5* 7.8* 8.2* 7.4* 9.5*  HCT 25.8* 23.1* 24.9* 23.2* 29.2*  MCV 90.9 89.8 89.7 91.7 92.0  PLT 274 258 288 312 332       Microbiology:  Recent Results (from the past 720 hour(s))  MRSA PCR Screening     Status: Abnormal   Collection Time: 12/26/2016 10:51 PM  Result Value Ref Range Status   MRSA by PCR POSITIVE (A) NEGATIVE Final    Comment:        The GeneXpert MRSA Assay (FDA approved for NASAL specimens only), is one component of a comprehensive MRSA colonization surveillance program. It is not intended to diagnose MRSA infection nor to guide or monitor treatment for MRSA infections. RESULT CALLED TO, READ BACK BY AND VERIFIED WITH: CYNTHIA BURGESS @ 0021 ON 12/17/2016 BY CAF   Culture, blood (Routine X 2) w Reflex to ID Panel     Status: None (Preliminary result)   Collection Time: 01/03/2017  1:43 PM  Result Value Ref Range Status   Specimen Description BLOOD RIGHT ARM  Final   Special Requests   Final    BOTTLES DRAWN AEROBIC AND ANAEROBIC AER 6CC ANA 4CC   Culture NO GROWTH < 24 HOURS  Final   Report  Status PENDING  Incomplete  Culture, blood (Routine X 2) w Reflex to ID Panel     Status: None (Preliminary result)   Collection Time: 12/30/2016  1:43 PM  Result Value Ref Range Status   Specimen Description BLOOD LEFT HAND  Final   Special Requests   Final    BOTTLES DRAWN AEROBIC AND ANAEROBIC AER 6CC ANA [place]   Culture NO GROWTH < 24 HOURS  Final   Report Status PENDING  Incomplete    Coagulation Studies:  Recent Labs  01/01/2017 2135  LABPROT 19.0*  INR 1.58    Urinalysis:  Recent Labs  12/10/2016 1350  COLORURINE YELLOW*  LABSPEC 1.017  PHURINE 5.0  GLUCOSEU NEGATIVE  HGBUR NEGATIVE  BILIRUBINUR NEGATIVE  KETONESUR NEGATIVE  PROTEINUR 100*  NITRITE NEGATIVE  LEUKOCYTESUR TRACE*      Imaging: Dg Chest 1 View  Result Date: 12/20/2016 CLINICAL DATA:  Shortness of breath . EXAM: CHEST 1 VIEW COMPARISON:  01/02/2017 . FINDINGS: Interim placement of NG tube, its tip is below the left hemidiaphragm. Endotracheal tube, right IJ line stable position. Cardiomegaly with  bilateral pulmonary alveolar infiltrates consistent with CHF. Bilateral pneumonia cannot be excluded. No pneumothorax. IMPRESSION: 1. Interim placement of NG tube, its tip is below the left hemidiaphragm. Endotracheal tube and right IJ line in stable position. 2. Cardiomegaly with bilateral pulmonary infiltrates consistent pulmonary edema. Bilateral pneumonia cannot be excluded. No change from prior exam . Electronically Signed   By: Marcello Moores  Register   On: 12/20/2016 06:55   Dg Chest 1 View  Result Date: 12/22/2016 CLINICAL DATA:  Status post intubation and central line placement EXAM: CHEST 1 VIEW COMPARISON:  12/17/2016 FINDINGS: Cardiac shadow is stable. Diffuse bilateral airspace disease is again seen. Endotracheal tube is again seen in satisfactory position. Left subclavian central line extends in the left internal jugular vein stable from the prior study. No acute bony abnormality is noted. IMPRESSION: Stable bilateral airspace disease. Stable appearance of endotracheal tube and left subclavian vein central catheter with the tip in the left internal jugular vein Electronically Signed   By: Inez Catalina M.D.   On: 12/15/2016 19:23   Dg Chest 1 View  Result Date: 12/25/2016 CLINICAL DATA:  Dyspnea per physician order. Last night seizure-like activity with vomiting and shortness of breath, Pt was admitted 12/11/2016 for bilateral chest wall pain, shortness of breath, dyspnea on exertion, and generalized weakness. EXAM: CHEST 1 VIEW COMPARISON:  12/18/2016 FINDINGS: There are bilateral interstitial and alveolar airspace opacities involving the upper and lower lungs. There are trace bilateral pleural effusions. There is no pneumothorax. There is stable cardiomegaly. The osseous structures are unremarkable. IMPRESSION: Bilateral interstitial and alveolar airspace opacities throughout bilateral lungs which may reflect florid pulmonary edema versus multi lobar pneumonia. Electronically Signed   By: Kathreen Devoid    On: 01/01/2017 13:15   Dg Abd 1 View  Result Date: 01/03/2017 CLINICAL DATA:  OG tube placement EXAM: ABDOMEN - 1 VIEW COMPARISON:  None. FINDINGS: Enteric tube with tip in the left upper quadrant consistent with location in the body of the stomach. Right femoral venous catheter with tip over the inferior vena caval atrial junction. Scattered gas and stool throughout the colon. No bowel distention. Atelectasis or infiltration seen in the lung bases. Lumbar scoliosis convex towards the left with degenerative change. Aortic calcification. IMPRESSION: Enteric tube tip is in the left upper quadrant consistent with location in the body of the stomach. Electronically Signed   By: Gwyndolyn Saxon  Gerilyn Nestle M.D.   On: 12/10/2016 23:21   Ct Head Wo Contrast  Result Date: 12/18/2016 CLINICAL DATA:  Acute onset of stroke-like symptoms. Difficulty with speech, and possible seizure like activity. Initial encounter. EXAM: CT HEAD WITHOUT CONTRAST TECHNIQUE: Contiguous axial images were obtained from the base of the skull through the vertex without intravenous contrast. COMPARISON:  CT of the head performed 12/08/2016 FINDINGS: Brain: No evidence of acute infarction, hemorrhage, hydrocephalus, extra-axial collection or mass lesion/mass effect. Prominence of the sulci suggests mild cortical volume loss. Mild cerebellar atrophy is noted. Mild periventricular white matter change likely reflects small vessel ischemic microangiopathy. The brainstem and fourth ventricle are within normal limits. The basal ganglia are unremarkable in appearance. The cerebral hemispheres demonstrate grossly normal gray-white differentiation. No mass effect or midline shift is seen. Vascular: No hyperdense vessel or unexpected calcification. Skull: There is no evidence of fracture; visualized osseous structures are unremarkable in appearance. Sinuses/Orbits: The visualized portions of the orbits are within normal limits. The paranasal sinuses and mastoid  air cells are well-aerated. Other: No significant soft tissue abnormalities are seen. IMPRESSION: 1. No acute intracranial pathology seen on CT. 2. Mild cortical volume loss and scattered small vessel ischemic microangiopathy. Electronically Signed   By: Garald Balding M.D.   On: 12/18/2016 17:44   Mr Brain Wo Contrast  Result Date: 12/18/2016 CLINICAL DATA:  Episode of seizure like activity. Slurred speech. Tremors. Disorientation. EXAM: MRI HEAD WITHOUT CONTRAST TECHNIQUE: Multiplanar, multiecho pulse sequences of the brain and surrounding structures were obtained without intravenous contrast. COMPARISON:  Head CT same day.  Head CT 12/08/2016. FINDINGS: Brain: Diffusion imaging does not show any acute or subacute infarction. The study does suffer from considerable motion degradation. The brainstem is negative. There are old small vessel infarctions affecting the cerebellum bilaterally. Cerebral hemispheres show moderate chronic small-vessel ischemic changes of the deep white matter. No large vessel territory infarction. No mass lesion, hemorrhage, hydrocephalus or extra-axial collection. No pituitary mass. Vascular: Major vessels at the base of the brain show flow. Skull and upper cervical spine: Negative Sinuses/Orbits: Clear/normal Other: None significant IMPRESSION: No acute finding. 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 Moderate chronic small-vessel ischemic changes of the cerebral hemispheric white matter. No specific cause of seizure identified. These results will be called to the ordering clinician or representative by the Radiologist Assistant, and communication documented in the PACS or zVision Dashboard. Electronically Signed   By: Nelson Chimes M.D.   On: 12/18/2016 20:56   US Renal  Result Date: 12/11/2016 CLINICAL DATA:  Chronic kidney disease EXAM: RENAL / URINARY TRACT ULTRASOUND COMPLETE COMPARISON:  None. FINDINGS: Right Kidney: Length: 8.4 cm. No renal mass or  hydronephrosis. Renal cortical thinning with increased echogenicity. Left Kidney: Length: 9.2 cm. No renal mass or hydronephrosis. Renal cortical thinning with increased echogenicity. Bladder: Foley catheter in place.  Decompressed. IMPRESSION: 1. Atrophic bilateral kidneys with renal cortical thinning and increased renal echogenicity as can be seen with medical renal disease. Electronically Signed   By: Kathreen Devoid   On: 12/09/2016 13:20   Dg Chest Port 1 View  Result Date: 01/05/2017 CLINICAL DATA:  Central line placement EXAM: PORTABLE CHEST 1 VIEW COMPARISON:  Film from earlier in the same day FINDINGS: Bilateral airspace disease is again identified. The endotracheal tube is in satisfactory position. The previously seen left subclavian central line has been removed. A new right jugular central line is noted in satisfactory position. No pneumothorax is noted. IMPRESSION: Status post right jugular  central line placement in satisfactory position. The remainder of the exam is stable. Electronically Signed   By: Inez Catalina M.D.   On: 12/26/2016 21:14   Dg Chest Port 1 View  Result Date: 12/20/2016 CLINICAL DATA:  Endotracheal tube placement. Central line placement. EXAM: PORTABLE CHEST 1 VIEW COMPARISON:  Earlier same day FINDINGS: Defibrillator pads overlie the chest. Pacemaker has been placed from an inferior approach with the tip in the region of the right ventricle. Endotracheal tube has its tip 5 cm above the carina. Left subclavian central line turns up the left internal jugular vein, tip not visible. Diffuse airspace density persists but is improved. IMPRESSION: Less diffuse airspace density/ improved pulmonary aeration. Endotracheal tube tip 5 cm above the carina. Left subclavian central line turns up the left IJ, tip not visible. Pacemaker placed from below has the lead tip in the region of the right ventricle. These results will be called to the ordering clinician or representative by the  Radiologist Assistant, and communication documented in the PACS or zVision Dashboard. Electronically Signed   By: Nelson Chimes M.D.   On: 12/28/2016 18:46     Medications:   . sodium chloride 50 mL/hr at 12/20/16 0413  . insulin (NOVOLIN-R) infusion 5.9 Units/hr (12/20/16 0949)  . norepinephrine (LEVOPHED) Adult infusion 12 mcg/min (12/20/16 0602)  . vasopressin (PITRESSIN) infusion - *FOR SHOCK* 0.03 Units/min (12/30/2016 2138)   . aspirin  325 mg Per NG tube Daily  . ceFEPime (MAXIPIME) IV  2 g Intravenous Q24H  . chlorhexidine gluconate (MEDLINE KIT)  15 mL Mouth Rinse BID  . Chlorhexidine Gluconate Cloth  6 each Topical Q0600  . famotidine (PEPCID) IV  20 mg Intravenous Q24H  . heparin  5,000 Units Subcutaneous Q8H  . ipratropium-albuterol  3 mL Nebulization Q6H  . levothyroxine  50 mcg Intravenous Daily  . mouth rinse  15 mL Mouth Rinse Q2H  . mupirocin ointment  1 application Nasal BID  . sodium chloride flush  10-40 mL Intracatheter Q12H  . sterile water (preservative free)      . vancomycin  1,000 mg Intravenous Q36H   acetaminophen, bisacodyl, dextrose, fentaNYL (SUBLIMAZE) injection, lip balm, midazolam, polyethylene glycol  Assessment/ Plan:  75 y.o. female female with diabetes mellitus type II, hypertension, peripheral vascular disease, obstructive sleep apnea, congestive heart failure, anemia, hypothyroidism, sjogren's, hyperlipidemia, seizure disorder and questionable lupus, who was admitted to [facility] on 12/11/2016   1. ARF on CKD s 4. Baseline Cr 1.77/GFR 27 (admission 12/18/2016) 2. B/l atrophic kidneys noted on u/s 3. Acute respiratory failure - vent dependent 4. DM-2/CKD   Underlying CKD is likely secondary to DM and atherosclerosis ARF is likely due to abnormal cardiorenal hemodymacs from Ao Stenosis and possibly ATN from recent events (hypotension)  PLAN Since UOP appears to be improving and S creatinine is stabilizing, will continue to monitor closely No acute  indication for HD at present Currently requiring pressors Will hold further lasix for now and monitor UOP.     LOS: 4 Topacio Cella 2/12/201810:04 AM

## 2016-12-20 NOTE — Progress Notes (Signed)
This patient has become increasingly more stable throughout this writer's shift. Levophed gtt was titrated off, followed by d/c'ing vasopressin gtt. Pt was transitioned off insulin gtt per stabilizer when pt had a low CBG of 51. 17 mL of D50 given IV, per glucose stabilizer. Recheck CBG was 95, followed by 75, then 85. By the end of the shift, pt is alert to self and place, [facility], attempting to speak around ETT, shakes head to yes/no questions. Pt tolerates ETT well. She has been sleeping/drowsy majority of the shift. Temp TransVenous pacer in place w/ no capture failures noted. All task/orders for this shift completed.

## 2016-12-20 NOTE — Progress Notes (Signed)
Initial Nutrition Assessment  DOCUMENTATION CODES:   Not applicable  INTERVENTION:  -Discussed nutrition poc during ICU rounds with MD Sung AmabileSimonds, MD did not want EN initiated today. Recommend initiation of EN within 24-48 hours if aggressive nutrition intervention warranted   NUTRITION DIAGNOSIS:   Inadequate oral intake related to acute illness as evidenced by NPO status.  GOAL:   Provide needs based on ASPEN/SCCM guidelines  MONITOR:   Vent status, Labs, Weight trends  REASON FOR ASSESSMENT:   Ventilator    ASSESSMENT:    76 yo female admitted with complete heart block, acute respiratory failure with pneumonia and CHF, acute on CKD IV   Patient is currently intubated on ventilator support, intubated 12/28/2016 MV: 7.2 L/min Temp (24hrs), Avg:97.6 F (36.4 C), Min:96.3 F (35.7 C), Max:98.4 F (36.9 C)  OG tube located in stomach  Labs: CBGs 80-453, Creatinine 3.48 Meds: insulin drip, levophed, vasopressin  Diet Order:   NPO  Skin:  Reviewed, no issues  Last BM:  12/15/2016  Height:   Ht Readings from Last 1 Encounters:  12/28/2016 5\' 2"  (1.575 m)    Weight:   Wt Readings from Last 1 Encounters:  12/22/2016 138 lb 14.4 oz (63 kg)    BMI:  Body mass index is 25.41 kg/m.  Estimated Nutritional Needs:   Kcal:  1210 kcals  Protein:  95-126 g  Fluid:  >/= 1.2 L  EDUCATION NEEDS:   No education needs identified at this time  Romelle StarcherCate Orrin Yurkovich MS, RD, LDN [phone] Pager  [phone] Weekend/On-Call Pager

## 2016-12-20 NOTE — Progress Notes (Addendum)
[facility] [place] at [facility]   PATIENT NAME: Chloe Sanchez    MR#:  [id number]  DATE OF BIRTH:  October 27, 1941  SUBJECTIVE:  CHIEF COMPLAINT:   Chief Complaint  Patient presents with  . 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 On vent and pressors. REVIEW OF SYSTEMS:  Review of Systems  Unable to perform ROS: Medical condition    DRUG ALLERGIES:  No Known Allergies VITALS:  Blood pressure (!) 126/57, pulse 70, temperature 97.7 F (36.5 C), temperature source Axillary, resp. rate 18, height 5\' 2"  (1.575 m), weight 138 lb 14.4 oz (63 kg), SpO2 98 %. PHYSICAL EXAMINATION:  Physical Exam  Constitutional:  On vent, critical ill looking  HENT:  Head: Normocephalic.  Eyes: Pupils are equal, round, and reactive to light. No scleral icterus.  Neck: No JVD present. No tracheal deviation present.  Cardiovascular: Normal rate and regular rhythm.  Exam reveals no gallop.   Murmur heard. Pulmonary/Chest: She has no wheezes. She has rales.  Abdominal: Soft. Bowel sounds are normal. She exhibits no distension.  Musculoskeletal: She exhibits edema. She exhibits no tenderness.  Trace leg edema.  Neurological:  On vent  Skin: No rash noted. No erythema.  Psychiatric:  On sedation   LABORATORY PANEL:   CBC  Recent Labs Lab 12/20/16 0416  WBC 20.3*  HGB 9.5*  HCT 29.2*  PLT 332   ------------------------------------------------------------------------------------------------------------------ Chemistries   Recent Labs Lab 12/09/2016 2351 12/20/16 0416  NA 133* 136  K 3.8 3.4*  CL 100* 99*  CO2 20* 26  GLUCOSE 600* 619*  BUN 53* 58*  CREATININE 3.54* 3.48*  CALCIUM 8.0* 7.9*  MG 1.9 1.9  AST 136*  --   ALT 74*  --   ALKPHOS 152*  --   BILITOT 0.8  --    RADIOLOGY:  Dg Chest 1 View  Result Date: 12/20/2016 CLINICAL DATA:  Shortness of breath . EXAM: CHEST 1  VIEW COMPARISON:  12/12/2016 . FINDINGS: Interim placement of NG tube, its tip is below the left hemidiaphragm. Endotracheal tube, right IJ line stable position. Cardiomegaly with bilateral pulmonary alveolar infiltrates consistent with CHF. Bilateral pneumonia cannot be excluded. No pneumothorax. IMPRESSION: 1. Interim placement of NG tube, its tip is below the left hemidiaphragm. Endotracheal tube and right IJ line in stable position. 2. Cardiomegaly with bilateral pulmonary infiltrates consistent pulmonary edema. Bilateral pneumonia cannot be excluded. No change from prior exam . Electronically Signed   By: Maisie Fus  Register   On: 12/20/2016 06:55   Dg Chest 1 View  Result Date: 12/24/2016 CLINICAL DATA:  Status post intubation and central line placement EXAM: CHEST 1 VIEW COMPARISON:  01/03/2017 FINDINGS: Cardiac shadow is stable. Diffuse bilateral airspace disease is again seen. Endotracheal tube is again seen in satisfactory position. Left subclavian central line extends in the left internal jugular vein stable from the prior study. No acute bony abnormality is noted. IMPRESSION: Stable bilateral airspace disease. Stable appearance of endotracheal tube and left subclavian vein central catheter with the tip in the left internal jugular vein Electronically Signed   By: Alcide Clever M.D.   On: 12/11/2016 19:23   Dg Chest 1 View  Result Date: 12/28/2016 CLINICAL DATA:  Dyspnea per physician order. Last night seizure-like activity with vomiting and shortness of breath, Pt was admitted January 03, 2017 for bilateral chest wall pain, shortness of breath, dyspnea on exertion,  and generalized weakness. EXAM: CHEST 1 VIEW COMPARISON:  12/18/2016 FINDINGS: There are bilateral interstitial and alveolar airspace opacities involving the upper and lower lungs. There are trace bilateral pleural effusions. There is no pneumothorax. There is stable cardiomegaly. The osseous structures are unremarkable. IMPRESSION: Bilateral  interstitial and alveolar airspace opacities throughout bilateral lungs which may reflect florid pulmonary edema versus multi lobar pneumonia. Electronically Signed   By: Elige KoHetal  Patel   On: 12/18/2016 13:15   Dg Abd 1 View  Result Date: 12/27/2016 CLINICAL DATA:  OG tube placement EXAM: ABDOMEN - 1 VIEW COMPARISON:  None. FINDINGS: Enteric tube with tip in the left upper quadrant consistent with location in the body of the stomach. Right femoral venous catheter with tip over the inferior vena caval atrial junction. Scattered gas and stool throughout the colon. No bowel distention. Atelectasis or infiltration seen in the lung bases. Lumbar scoliosis convex towards the left with degenerative change. Aortic calcification. IMPRESSION: Enteric tube tip is in the left upper quadrant consistent with location in the body of the stomach. Electronically Signed   By: Burman NievesWilliam  Stevens M.D.   On: 12/10/2016 23:21   [place] Renal  Result Date: 01/03/2017 CLINICAL DATA:  Chronic kidney disease EXAM: RENAL / URINARY TRACT ULTRASOUND COMPLETE COMPARISON:  None. FINDINGS: Right Kidney: Length: 8.4 cm. No renal mass or hydronephrosis. Renal cortical thinning with increased echogenicity. Left Kidney: Length: 9.2 cm. No renal mass or hydronephrosis. Renal cortical thinning with increased echogenicity. Bladder: Foley catheter in place.  Decompressed. IMPRESSION: 1. Atrophic bilateral kidneys with renal cortical thinning and increased renal echogenicity as can be seen with medical renal disease. Electronically Signed   By: Elige KoHetal  Patel   On: 12/26/2016 13:20   Dg Chest Port 1 View  Result Date: 01/01/2017 CLINICAL DATA:  Central line placement EXAM: PORTABLE CHEST 1 VIEW COMPARISON:  Film from earlier in the same day FINDINGS: Bilateral airspace disease is again identified. The endotracheal tube is in satisfactory position. The previously seen left subclavian central line has been removed. A new right jugular central line is noted  in satisfactory position. No pneumothorax is noted. IMPRESSION: Status post right jugular central line placement in satisfactory position. The remainder of the exam is stable. Electronically Signed   By: Alcide CleverMark  Lukens M.D.   On: 12/20/2016 21:14   Dg Chest Port 1 View  Result Date: 12/20/2016 CLINICAL DATA:  Endotracheal tube placement. Central line placement. EXAM: PORTABLE CHEST 1 VIEW COMPARISON:  Earlier same day FINDINGS: Defibrillator pads overlie the chest. Pacemaker has been placed from an inferior approach with the tip in the region of the right ventricle. Endotracheal tube has its tip 5 cm above the carina. Left subclavian central line turns up the left internal jugular vein, tip not visible. Diffuse airspace density persists but is improved. IMPRESSION: Less diffuse airspace density/ improved pulmonary aeration. Endotracheal tube tip 5 cm above the carina. Left subclavian central line turns up the left IJ, tip not visible. Pacemaker placed from below has the lead tip in the region of the right ventricle. These results will be called to the ordering clinician or representative by the Radiologist Assistant, and communication documented in the PACS or zVision Dashboard. Electronically Signed   By: Paulina FusiMark  Shogry M.D.   On: 01/04/2017 18:46   ASSESSMENT AND PLAN:   * Pneumonia and sepsis.   She was treated with  Rocephin plus azithromycin. Changed to vancomycin and Zosyn due to worsening multifocal pneumonia. Now changed to cefepime and vancomycin.  F/u CBC and blood culture.  * Acute respiratory failure with hypoxia due to pneumonia, COPD exacerbation and acute on chronic diastolic CHF (EF 40-98%). On vent. Lasix on hold due to hypotension.  * Elevated troponin, due to demanding ischemia.   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 Mariah Milling. HR in normal range.  Severe aortic stenosis.  * Hypotension. Continue dopamine and levophed drip.  * Acute on chronic renal failure,    Creatinine  is higher than baseline creatinine which was 1 year ago. She was on IV fluid due to worsening renal function. Since the patient has history of CHF, IVF was hold. Lasix iv bid was given.  * COPD exacerbation.    started iv solumedrol, continue inhalers and Spiriva.  * Hypertension   hold norvasc due to low BP.  * Diabetes with hyperglycemia.   On insulin drip.  * Hypothyroidism   Continue levothyroxine.  Anemia of chronic disease. Hemoglobin decreased to 7.8. Follow-up hemoglobin is stable.  Seizure-like activity and acute encephalopathy. MRI of the brain: no CVA.  Discussed with Dr. Mariah Milling and Dr. Park Breed. The patient's condition is declining, high risk for cardiopulmonary arrest and has poor prognosis. Her son change to DNR status. All the records are reviewed and case discussed with Care Management/Social Worker. Management plans discussed with the patient, her son and they are in agreement.  CODE STATUS: DNR  TOTAL Critical TIME TAKING CARE OF THIS PATIENT: 46 minutes.   More than 50% of the time was spent in counseling/coordination of care: YES  POSSIBLE D/C IN ? DAYS, DEPENDING ON CLINICAL CONDITION.   Shaune Pollack M.D on 12/20/2016 at 8:07 AM  Between 7am to 6pm - Pager - [phone]  After 6pm go to [email or web address] - Social research officer, government  [facility]  Office  [phone]  CC: Primary care physician; Delton [place]irie, FNP  Note: This dictation was prepared with Dragon dictation along with smaller phrase technology. Any transcriptional errors that result from this process are unintentional.

## 2016-12-20 NOTE — Progress Notes (Signed)
Inpatient Diabetes Program Recommendations  AACE/ADA: New Consensus Statement on Inpatient Glycemic Control (2015)  Target Ranges:  Prepandial:   less than 140 mg/dL      Peak postprandial:   less than 180 mg/dL (1-2 hours)      Critically ill patients:  140 - 180 mg/dL   Results for Chloe Sanchez, [name] (MRN [id number]) as of 12/20/2016 07:46  Ref. Range 2017-04-12 07:33 2017-04-12 11:48 2017-04-12 19:37 12/20/2016 00:48 12/20/2016 02:00 12/20/2016 03:02 12/20/2016 03:59 12/20/2016 04:57 12/20/2016 06:01 12/20/2016 07:05  Glucose-Capillary Latest Ref Range: 65 - 99 mg/dL 96 829161 (H) 562252 (H) 130430 (H) 486 (H) 447 (H) 491 (H) 444 (H) 453 (H) 445 (H)    Admit with: Pneumonia  History: DM, CHF, CKD  Home DM Meds: None listed  Current Insulin Orders: IV Insulin drip      MD- Note patient has been Intubated.  Receiving Solumedrol 40 mg Q8 hours.  Requiring Vasopressor support.  Patient was started on IV Insulin drip early this AM at 4am.  Currently on the IV Insulin/GlucoStabilizier order set.  Please consider switching patient over to the ICU Glycemic Control Order set- Phase 2 IV insulin instead so patient will have aggressive blood glucose management parameters when nearing transition off IV Insulin drip      --Will follow patient during hospitalization--  Ambrose FinlandJeannine Johnston Torsha Lemus RN, MSN, CDE Diabetes Coordinator Inpatient Glycemic Control Team Team Pager: [phone] (8a-5p)

## 2016-12-20 NOTE — Progress Notes (Addendum)
Patient Name: Chloe Sanchez Date of Encounter: 12/20/2016  Primary Cardiologist: New to [facility] - consult by [facility] Problem List     Active Problems:   Pneumonia   Complete heart block ([place])   Acute renal failure (HCC)   Acute chronic obstructive pulmonary disease with respiratory distress (HCC)   Chronic kidney disease, stage III (moderate)   Cough   Dyspnea   Generalized weakness   Pleural effusion   Anemia in chronic kidney disease   Hypotension   Bradycardia     Subjective   Remains critically ill. Intubated in the PM of 12/11/2016. Status post emergent venous temp wire on 2/11 by Dr. Fletcher Anon, MD. On Epi and Levophed. Echo showed normal EF as below. FiO2 50%. Potassium 3.4 this morning status post repletion. Renal function slowly improving. On insulin gtt 2/2 blood sugars. PCT 59. WBC 20.3, hgb 9.5, plt 332. MAP 76.   Inpatient Medications    Scheduled Meds: . aspirin  325 mg Per NG tube Daily  . atorvastatin  40 mg Oral q1800  . ceFEPime (MAXIPIME) IV  2 g Intravenous Q24H  . chlorhexidine gluconate (MEDLINE KIT)  15 mL Mouth Rinse BID  . Chlorhexidine Gluconate Cloth  6 each Topical Q0600  . ferrous sulfate  300 mg Per Tube Q breakfast  . furosemide  20 mg Intravenous Once  . furosemide  40 mg Intravenous Q6H  . heparin  5,000 Units Subcutaneous Q8H  . ipratropium-albuterol  3 mL Nebulization TID  . levothyroxine  50 mcg Intravenous Daily  . mouth rinse  15 mL Mouth Rinse Q2H  . methadone  10 mg Per NG tube BID  . methadone  20 mg Per NG tube QHS  . methylPREDNISolone (SOLU-MEDROL) injection  40 mg Intravenous Q8H  . mupirocin ointment  1 application Nasal BID  . pantoprazole (PROTONIX) IV  40 mg Intravenous Q24H  . sodium chloride flush  10-40 mL Intracatheter Q12H  . sterile water (preservative free)      . vancomycin  1,000 mg Intravenous Q36H   Continuous Infusions: . sodium chloride 50 mL/hr at 12/20/16 0413  . DOPamine Stopped (12/20/16 0214)    . epinephrine Stopped (12/20/16 0412)  . insulin (NOVOLIN-R) infusion 15.4 Units/hr (12/20/16 0706)  . norepinephrine (LEVOPHED) Adult infusion 12 mcg/min (12/20/16 0602)  . vasopressin (PITRESSIN) infusion - *FOR SHOCK* 0.03 Units/min (01/04/2017 2138)   PRN Meds: acetaminophen, bisacodyl, dextrose, fentaNYL (SUBLIMAZE) injection, fentaNYL (SUBLIMAZE) injection, guaiFENesin-dextromethorphan, lip balm, midazolam, midazolam, polyethylene glycol   Vital Signs    Vitals:   12/20/16 0400 12/20/16 0430 12/20/16 0500 12/20/16 0600  BP:  130/61 (!) 141/58 (!) 126/57  Pulse:  70 70 70  Resp:  15 17 18   Temp: 97.7 F (36.5 C)     TempSrc: Axillary     SpO2:  100% 99% 98%  Weight:      Height:        Intake/Output Summary (Last 24 hours) at 12/20/16 0840 Last data filed at 12/20/16 0600  Gross per 24 hour  Intake          2896.84 ml  Output              928 ml  Net          1968.84 ml   Filed Weights   12/15/2016 1612 12/17/2016 2010  Weight: 139 lb (63 kg) 138 lb 14.4 oz (63 kg)    Physical Exam    GEN: Critically  ill appearing and intubated.  HEENT: Grossly normal.  Neck: Supple, no JVD, carotid bruits, or masses. Prior central line dressed right side. Cardiac: Paced, III/VI systolic murmur RUSB radiating to the neck, no rubs, or gallops. No clubbing, cyanosis, edema.  Radials/DP/PT 2+ and equal bilaterally.  Respiratory:  Decreased breath sounds bilaterally with scattered rhonchi. GI: Soft, nontender, nondistended, BS + x 4. MS: No deformity or atrophy. Skin: Cool and dry, no rash. Neuro:  Intubated. Psych: Intubated.  Labs    CBC  Recent Labs  12/15/2016 2135 12/20/16 0416  WBC 19.0* 20.3*  HGB 7.4* 9.5*  HCT 23.2* 29.2*  MCV 91.7 92.0  PLT 312 951   Basic Metabolic Panel  Recent Labs  01/04/2017 2351 12/20/16 0416  NA 133* 136  K 3.8 3.4*  CL 100* 99*  CO2 20* 26  GLUCOSE 600* 619*  BUN 53* 58*  CREATININE 3.54* 3.48*  CALCIUM 8.0* 7.9*  MG 1.9 1.9   PHOS 7.3*  --    Liver Function Tests  Recent Labs  12/30/2016 2351  AST 136*  ALT 74*  ALKPHOS 152*  BILITOT 0.8  PROT 6.3*  ALBUMIN 2.5*   No results for input(s): LIPASE, AMYLASE in the last 72 hours. Cardiac Enzymes  Recent Labs  01/02/2017 0514 01/02/2017 1113 01/03/2017 1819 01/05/2017 2351  CKTOTAL  --   --   --  40  TROPONINI 0.32* 0.20* 0.17*  --    BNP Invalid input(s): POCBNP D-Dimer No results for input(s): DDIMER in the last 72 hours. Hemoglobin A1C No results for input(s): HGBA1C in the last 72 hours. Fasting Lipid Panel  Recent Labs  12/22/2016 0514  CHOL 108  HDL 43  LDLCALC 50  TRIG 74  CHOLHDL 2.5   Thyroid Function Tests No results for input(s): TSH, T4TOTAL, T3FREE, THYROIDAB in the last 72 hours.  Invalid input(s): FREET3  Telemetry    Paced 70 bpm - Personally Reviewed  ECG    n/a - Personally Reviewed  Radiology    Dg Chest 1 View  Result Date: 12/20/2016 CLINICAL DATA:  Shortness of breath . EXAM: CHEST 1 VIEW COMPARISON:  12/20/2016 . FINDINGS: Interim placement of NG tube, its tip is below the left hemidiaphragm. Endotracheal tube, right IJ line stable position. Cardiomegaly with bilateral pulmonary alveolar infiltrates consistent with CHF. Bilateral pneumonia cannot be excluded. No pneumothorax. IMPRESSION: 1. Interim placement of NG tube, its tip is below the left hemidiaphragm. Endotracheal tube and right IJ line in stable position. 2. Cardiomegaly with bilateral pulmonary infiltrates consistent pulmonary edema. Bilateral pneumonia cannot be excluded. No change from prior exam . Electronically Signed   By: Marcello Moores  Register   On: 12/20/2016 06:55   Dg Chest 1 View  Result Date: 12/10/2016 CLINICAL DATA:  Status post intubation and central line placement EXAM: CHEST 1 VIEW COMPARISON:  12/18/2016 FINDINGS: Cardiac shadow is stable. Diffuse bilateral airspace disease is again seen. Endotracheal tube is again seen in satisfactory  position. Left subclavian central line extends in the left internal jugular vein stable from the prior study. No acute bony abnormality is noted. IMPRESSION: Stable bilateral airspace disease. Stable appearance of endotracheal tube and left subclavian vein central catheter with the tip in the left internal jugular vein Electronically Signed   By: Inez Catalina M.D.   On: 12/21/2016 19:23   Dg Chest 1 View  Result Date: 12/17/2016 CLINICAL DATA:  Dyspnea per physician order. Last night seizure-like activity with vomiting and shortness of breath, Pt was  admitted 12/15/2016 for bilateral chest wall pain, shortness of breath, dyspnea on exertion, and generalized weakness. EXAM: CHEST 1 VIEW COMPARISON:  12/18/2016 FINDINGS: There are bilateral interstitial and alveolar airspace opacities involving the upper and lower lungs. There are trace bilateral pleural effusions. There is no pneumothorax. There is stable cardiomegaly. The osseous structures are unremarkable. IMPRESSION: Bilateral interstitial and alveolar airspace opacities throughout bilateral lungs which may reflect florid pulmonary edema versus multi lobar pneumonia. Electronically Signed   By: Kathreen Devoid   On: 12/17/2016 13:15   Dg Abd 1 View  Result Date: 01/02/2017 CLINICAL DATA:  OG tube placement EXAM: ABDOMEN - 1 VIEW COMPARISON:  None. FINDINGS: Enteric tube with tip in the left upper quadrant consistent with location in the body of the stomach. Right femoral venous catheter with tip over the inferior vena caval atrial junction. Scattered gas and stool throughout the colon. No bowel distention. Atelectasis or infiltration seen in the lung bases. Lumbar scoliosis convex towards the left with degenerative change. Aortic calcification. IMPRESSION: Enteric tube tip is in the left upper quadrant consistent with location in the body of the stomach. Electronically Signed   By: Lucienne Capers M.D.   On: 12/17/2016 23:21   Ct Head Wo Contrast  Result  Date: 12/18/2016 CLINICAL DATA:  Acute onset of stroke-like symptoms. Difficulty with speech, and possible seizure like activity. Initial encounter. EXAM: CT HEAD WITHOUT CONTRAST TECHNIQUE: Contiguous axial images were obtained from the base of the skull through the vertex without intravenous contrast. COMPARISON:  CT of the head performed 12/08/2016 FINDINGS: Brain: No evidence of acute infarction, hemorrhage, hydrocephalus, extra-axial collection or mass lesion/mass effect. Prominence of the sulci suggests mild cortical volume loss. Mild cerebellar atrophy is noted. Mild periventricular white matter change likely reflects small vessel ischemic microangiopathy. The brainstem and fourth ventricle are within normal limits. The basal ganglia are unremarkable in appearance. The cerebral hemispheres demonstrate grossly normal gray-white differentiation. No mass effect or midline shift is seen. Vascular: No hyperdense vessel or unexpected calcification. Skull: There is no evidence of fracture; visualized osseous structures are unremarkable in appearance. Sinuses/Orbits: The visualized portions of the orbits are within normal limits. The paranasal sinuses and mastoid air cells are well-aerated. Other: No significant soft tissue abnormalities are seen. IMPRESSION: 1. No acute intracranial pathology seen on CT. 2. Mild cortical volume loss and scattered small vessel ischemic microangiopathy. Electronically Signed   By: Garald Balding M.D.   On: 12/18/2016 17:44   Mr Brain Wo Contrast  Result Date: 12/18/2016 CLINICAL DATA:  Episode of seizure like activity. Slurred speech. Tremors. Disorientation. EXAM: MRI HEAD WITHOUT CONTRAST TECHNIQUE: Multiplanar, multiecho pulse sequences of the brain and surrounding structures were obtained without intravenous contrast. COMPARISON:  Head CT same day.  Head CT 12/08/2016. FINDINGS: Brain: Diffusion imaging does not show any acute or subacute infarction. The study does suffer from  considerable motion degradation. The brainstem is negative. There are old small vessel infarctions affecting the cerebellum bilaterally. Cerebral hemispheres show moderate chronic small-vessel ischemic changes of the deep white matter. No large vessel territory infarction. No mass lesion, hemorrhage, hydrocephalus or extra-axial collection. No pituitary mass. Vascular: Major vessels at the base of the brain show flow. Skull and upper cervical spine: Negative Sinuses/Orbits: Clear/normal Other: None significant IMPRESSION: No acute finding. 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 Moderate chronic small-vessel ischemic changes of the cerebral hemispheric white matter. No specific cause of seizure identified. These results will be called to the ordering clinician or  representative by the Radiologist Assistant, and communication documented in the PACS or zVision Dashboard. Electronically Signed   By: Nelson Chimes M.D.   On: 12/18/2016 20:56   US Renal  Result Date: 01/02/2017 CLINICAL DATA:  Chronic kidney disease EXAM: RENAL / URINARY TRACT ULTRASOUND COMPLETE COMPARISON:  None. FINDINGS: Right Kidney: Length: 8.4 cm. No renal mass or hydronephrosis. Renal cortical thinning with increased echogenicity. Left Kidney: Length: 9.2 cm. No renal mass or hydronephrosis. Renal cortical thinning with increased echogenicity. Bladder: Foley catheter in place.  Decompressed. IMPRESSION: 1. Atrophic bilateral kidneys with renal cortical thinning and increased renal echogenicity as can be seen with medical renal disease. Electronically Signed   By: Kathreen Devoid   On: 12/18/2016 13:20   Dg Chest Port 1 View  Result Date: 12/09/2016 CLINICAL DATA:  Central line placement EXAM: PORTABLE CHEST 1 VIEW COMPARISON:  Film from earlier in the same day FINDINGS: Bilateral airspace disease is again identified. The endotracheal tube is in satisfactory position. The previously seen left subclavian central line has been  removed. A new right jugular central line is noted in satisfactory position. No pneumothorax is noted. IMPRESSION: Status post right jugular central line placement in satisfactory position. The remainder of the exam is stable. Electronically Signed   By: Inez Catalina M.D.   On: 12/26/2016 21:14   Dg Chest Port 1 View  Result Date: 12/27/2016 CLINICAL DATA:  Endotracheal tube placement. Central line placement. EXAM: PORTABLE CHEST 1 VIEW COMPARISON:  Earlier same day FINDINGS: Defibrillator pads overlie the chest. Pacemaker has been placed from an inferior approach with the tip in the region of the right ventricle. Endotracheal tube has its tip 5 cm above the carina. Left subclavian central line turns up the left internal jugular vein, tip not visible. Diffuse airspace density persists but is improved. IMPRESSION: Less diffuse airspace density/ improved pulmonary aeration. Endotracheal tube tip 5 cm above the carina. Left subclavian central line turns up the left IJ, tip not visible. Pacemaker placed from below has the lead tip in the region of the right ventricle. These results will be called to the ordering clinician or representative by the Radiologist Assistant, and communication documented in the PACS or zVision Dashboard. Electronically Signed   By: Nelson Chimes M.D.   On: 12/26/2016 18:46    Cardiac Studies   Echo 12/14/2016: Study Conclusions  - Left ventricle: The cavity size was normal. Systolic function was   normal. The estimated ejection fraction was in the range of 60%   to 65%. Wall motion was normal; there were no regional wall   motion abnormalities. The study is not technically sufficient to   allow evaluation of LV diastolic function. - Aortic valve: Transvalvular velocity was increased. There was   severe stenosis. There was mild regurgitation. Peak velocity (S):   482 cm/s. Mean gradient (S): 51 mm Hg. Valve area (VTI): 0.68   cm^2. - Mitral valve: There was moderate  regurgitation. - Left atrium: The atrium was mildly dilated. - Right ventricle: Systolic function was normal. - Tricuspid valve: There was mild-moderate regurgitation. - Pulmonary arteries: Systolic pressure was severely elevated. PA   peak pressure: 72 mm Hg (S).  Impressions:  - Rhythm is complete heart block.  Patient Profile     76 y.o. female with history of CHF, CKD, DM, depression, HTN who presented to the EF with CHB, elevated troponin, and sepsis with PNA.  Assessment & Plan    1. Complete heart block: -Status post  venous temp wire on 2/11 with heart rate pacing at 70 bpm -Echo showed preserved EF as above -Likely contributing to her respiratory failure requiring intubation -Continue pacing at this time -Await improvement in status from acute illness to likely place PPM -Cannot rule out cardiogenic shock  2. Acute respiratory failure/PNA requiring intubation: -Likely multifactorial including PNA and acute diastolic CHF -Not on sedation -Will flutter eyes when spoken to -Wean as able  3. Sepsis with PNA: -Cannot rule out septic shock -ABX per IM -Pressors per PCCM  4. Acute diastolic CHF: -MAP 76 this morning -Limit IV fluids -Perhaps in the next 24-36 hours could start IV Lasix for diuresis -No beta blocker 2/2 the above  5. Acute on chronic renal failure: -Appears stable at this time -No indication for dialysis at this time -[place] ATN in the setting of her infection and hypotension  6. Severe aortic stenosis: -Measurements are severe on echocardiogram done today -Previously noted to be moderate one year ago -Likely contributing to CHF symptoms -Will need to stabilize respiratory status, heart rate, -Determine if permanent pacer is indicated, at that time could discuss with surgical team in [place] whether she would be a candidate for TAVR, will need R/LHC prior  7. Anemia: -Unable to to exclude iron deficiency vs anemia of chronic  disease -Maintain hgb > 8.5  8. Elevated troponin: -Likely supply demand ischemia 2/2 the above -May require ischemic evaluation pending her recovery given her valvular heart disease   Signed, Marcille Blanco [place] Pager: [phone] 12/20/2016, 8:40 AM   Attending Note Patient seen and examined, agree with detailed note above,  Patient presentation and plan discussed on rounds/ICU rounds.   Severe hypotension overnight, improved BP this AM weaned off dopamine Lower urine outpt this AM Levo 12, vaospressin 0.3 On the vent, no sedation  Paced rhythm with transvenous pacer SBP 120 this Am  Coarse lung sounds b/l, RRR with 3/6 SEM RSB, ABD soft NT, No edema  CR 3.48  ---plan as above Sepsis/pna On pressors, broad ABX  ---complete heart block Transvenous pacer Will discuss with EP, Dr. Caryl Comes  Pacer candidate? May have to wait until sepsis runs coarse, hemodynamically stable  ---acute on chronic renal failure ATN from hypotension  ----severe aortic valve stenosis Critical at this time, likely contributing to presentation and CHF sx Once stable from PNA, sepsis, pacer, TAVR candidate?  Discussed with nursing, ICU team Greater than 50% was spent in counseling and coordination of care with patient Total encounter time 35 minutes or more   Signed: Esmond Plants  M.D., Ph.D. [facility]

## 2016-12-20 NOTE — Consult Note (Signed)
Reason for Consult:Shaking episodes Referring Physician: Bridgett Larsson  CC: Shaking episodes  HPI: Chloe Sanchez is an 76 y.o. female with multiple medical problems who due to mental status is unable to provide any history today.  Family not available so all history obtained from the chart.  Patient admitted on 2/8 with SOB, weakness and worsening cough.  Patient was bradycardic and found to have PNA.  Patient treated with Rocephin and Azithromycin.  On night of 2/9 patient found to have tremors.  Did seem to acknowledge staff based on notes during episode.  HR fluctuating, O2 sats in the 80's.  CXR was worse and patient was started on Vanc and Zosyn.  Last evening patient had more shaking episodes.  Per RR patient with contracted limbs but able to remain alert and talk throughout the event.  Was oriented to everything but date.  Was incontinent at CT.     Currently intubated and not following commands.   Past Medical History:  Diagnosis Date  . Anemia   . Anxiety   . Arthritis   . CHF (congestive heart failure) ([place])   . Chronic kidney disease    CKD  . Depression   . Diabetes mellitus without complication ([place])   . Gout   . Hypertension   . Hypothyroidism   . On supplemental oxygen therapy    AT NIGHT  . Polyneuropathy ([place])   . RLS (restless legs syndrome)   . Sjogren's syndrome ([place])   . Sleep apnea     Past Surgical History:  Procedure Laterality Date  . BACK SURGERY     lumbar  . CHOLECYSTECTOMY    . FOOT SURGERY Bilateral   . TEMPORARY PACEMAKER N/A 12/13/2016   Procedure: Temporary Pacemaker;  Surgeon: Wellington Hampshire, MD;  Location: [facility];  Service: Cardiovascular;  Laterality: N/A;  . TOTAL KNEE ARTHROPLASTY Right 07/17/2015   Procedure: TOTAL KNEE ARTHROPLASTY;  Surgeon: Hessie Knows, MD;  Location: [facility];  Service: Orthopedics;  Laterality: Right;    Family History  Problem Relation Age of Onset  . Lung cancer Sister     Social History:  reports that  she has never smoked. She quit smokeless tobacco use about 17 months ago. Her smokeless tobacco use included Snuff. She reports that she does not drink alcohol or use drugs.  No Known Allergies  Medications:  I have reviewed the patient's current medications. Prior to Admission:  Prescriptions Prior to Admission  Medication Sig Dispense Refill Last Dose  . albuterol (PROVENTIL HFA;VENTOLIN HFA) 108 (90 BASE) MCG/ACT inhaler Inhale 2 puffs into the lungs every 4 (four) hours as needed for wheezing or shortness of breath.   prn at prn  . allopurinol (ZYLOPRIM) 100 MG tablet Take 100 mg by mouth daily.   12/17/2016 at 0800  . amitriptyline (ELAVIL) 50 MG tablet Take 50 mg by mouth at bedtime.   12/15/2016 at 2000  . amLODipine-atorvastatin (CADUET) 5-10 MG per tablet Take 1 tablet by mouth daily.   12/12/2016 at 0800  . aspirin 81 MG tablet Take 81 mg by mouth daily.   12/11/2016 at 0800  . Carboxymethylcellul-Glycerin (OPTIVE) 0.5-0.9 % SOLN Apply 1-2 drops to eye 3 (three) times daily as needed.   prn at prn  . colchicine 0.6 MG tablet Take 0.6 mg by mouth daily. 2 BY MOUTH FIRST SIGN OF GOUT THEN 1 DAILY AS NEEDED   prn at prn  . ergocalciferol (VITAMIN D2) 50000 UNITS capsule Take 50,000 Units  by mouth every 30 (thirty) days.   Past Month at Unknown time  . esomeprazole (NEXIUM) 40 MG capsule Take 40 mg by mouth daily at 12 noon. AM   12/10/2016 at 0800  . ferrous sulfate 325 (65 FE) MG tablet Take 325 mg by mouth daily with breakfast.   12/15/2016 at 0800  . gabapentin (NEURONTIN) 600 MG tablet Take 600 mg by mouth 2 (two) times daily.    12/30/2016 at 0800  . guaiFENesin-dextromethorphan (ROBITUSSIN DM) 100-10 MG/5ML syrup Take 5 mLs by mouth every 4 (four) hours as needed for cough.   prn at prn  . levothyroxine (SYNTHROID, LEVOTHROID) 88 MCG tablet Take 88 mcg by mouth daily before breakfast.   12/31/2016 at 0730  . methadone (DOLOPHINE) 10 MG tablet Take 1 tablet (10 mg total) by mouth daily with lunch.  (Patient taking differently: Take 10 mg by mouth 3 (three) times daily. 1 tablet at 0800, 1 tablet at 1330, 2 tablets at 2100) 30 tablet 0   . polyethylene glycol (MIRALAX / GLYCOLAX) packet Take 17 g by mouth daily.   prn at prn  . pregabalin (LYRICA) 150 MG capsule Take 1 capsule (150 mg total) by mouth 2 (two) times daily. 30 capsule 0 12/14/2016 at 0800  . rOPINIRole (REQUIP) 1 MG tablet Take 1 mg by mouth 2 (two) times daily. 0800, 1930   12/17/2016 at 0800  . tiotropium (SPIRIVA) 18 MCG inhalation capsule Place 18 mcg into inhaler and inhale daily.   12/31/2016 at 0800  . trolamine salicylate (ASPERCREME) 10 % cream Apply 1 application topically 3 (three) times daily as needed for muscle pain.   prn at prn  . methadone (DOLOPHINE) 10 MG tablet Take 3 tablets (30 mg total) by mouth daily with breakfast. (Patient not taking: Reported on 12/23/2016) 30 tablet 0 Not Taking at Unknown time   Scheduled: . aspirin  325 mg Per NG tube Daily  . ceFEPime (MAXIPIME) IV  2 g Intravenous Q24H  . chlorhexidine gluconate (MEDLINE KIT)  15 mL Mouth Rinse BID  . Chlorhexidine Gluconate Cloth  6 each Topical Q0600  . famotidine (PEPCID) IV  20 mg Intravenous Q24H  . heparin  5,000 Units Subcutaneous Q8H  . ipratropium-albuterol  3 mL Nebulization Q6H  . levothyroxine  50 mcg Intravenous Daily  . mouth rinse  15 mL Mouth Rinse Q2H  . mupirocin ointment  1 application Nasal BID  . sodium chloride flush  10-40 mL Intracatheter Q12H  . sterile water (preservative free)      . vancomycin  1,000 mg Intravenous Q36H    ROS: Patient unable to provide due to mental status  Physical Examination: Blood pressure (!) 126/57, pulse 70, temperature 97.7 F (36.5 C), temperature source Axillary, resp. rate 18, height 5' 2" (1.575 m), weight 63 kg (138 lb 14.4 oz), SpO2 98 %.  HEENT-  Normocephalic, no lesions, without obvious abnormality.  Normal external eye and conjunctiva.  Normal TM's bilaterally.  Normal auditory  canals and external ears. Normal external nose, mucus membranes and septum.  Normal pharynx. Cardiovascular- S1, S2 normal, pulses palpable throughout   Lungs- chest clear, no wheezing, rales, normal symmetric air entry Abdomen- soft, non-tender; bowel sounds normal; no masses,  no organomegaly Extremities- no edema Lymph-no adenopathy palpable Musculoskeletal-no joint tenderness, deformity or swelling Skin-Miultiple scars due to past surgeries  Neurological Examination  Mental Status: Patient obtunded. Not following commands.  Cranial Nerves: II: patient does not respond confrontation bilaterally, pupils right 3  mm, left 3 mm,and reactive bilaterally III,IV,VI: doll's response absent bilaterally. V,VII: corneal reflex present bilaterally  VIII: grossly intact IX,X: gag reflex present, XI: trapezius strength unable to test bilaterally XII: tongue kidlinet Motor: Withdraws bilaterally  Sensory: Responds to noxious stimuli in all extremities. Deep Tendon Reflexes:  1+ in the upper extremities and absent in the lower extremities Plantars: Mute bilaterally Cerebellar: Unable to perform    Laboratory Studies:   Basic Metabolic Panel:  Recent Labs Lab 12/18/16 0753 01/05/2017 0514 01/02/2017 1819 12/30/2016 2351 12/20/16 0416  NA 136 136 136 133* 136  K 4.4 5.0 4.1 3.8 3.4*  CL 106 107 107 100* 99*  CO2 24 20* 18* 20* 26  GLUCOSE 103* 114* 246* 600* 619*  BUN 33* 38* 52* 53* 58*  CREATININE 2.47* 2.65* 3.44* 3.54* 3.48*  CALCIUM 8.5* 8.6* 8.9 8.0* 7.9*  MG  --  1.7  --  1.9 1.9  PHOS  --   --   --  7.3*  --     Liver Function Tests:  Recent Labs Lab 12/20/2016 2351  AST 136*  ALT 74*  ALKPHOS 152*  BILITOT 0.8  PROT 6.3*  ALBUMIN 2.5*   No results for input(s): LIPASE, AMYLASE in the last 168 hours. No results for input(s): AMMONIA in the last 168 hours.  CBC:  Recent Labs Lab 12/17/16 0649 12/18/16 0753 01/05/2017 0514 12/26/2016 2135 12/20/16 0416  WBC  7.5 7.8 11.9* 19.0* 20.3*  HGB 8.5* 7.8* 8.2* 7.4* 9.5*  HCT 25.8* 23.1* 24.9* 23.2* 29.2*  MCV 90.9 89.8 89.7 91.7 92.0  PLT 274 258 288 312 332    Cardiac Enzymes:  Recent Labs Lab 12/09/2016 1613 12/31/2016 0514 12/12/2016 1113 12/30/2016 1819 12/10/2016 2351  CKTOTAL  --   --   --   --  40  TROPONINI <0.03 0.32* 0.20* 0.17*  --     BNP: Invalid input(s): POCBNP  CBG:  Recent Labs Lab 12/20/16 0601 12/20/16 0705 12/20/16 0841 12/20/16 0947 12/20/16 1045  GLUCAP 453* 24* 334* 207* 170*    Microbiology: Results for orders placed or performed during the hospital encounter of 12/20/2016  MRSA PCR Screening     Status: Abnormal   Collection Time: 01/01/2017 10:51 PM  Result Value Ref Range Status   MRSA by PCR POSITIVE (A) NEGATIVE Final    Comment:        The GeneXpert MRSA Assay (FDA approved for NASAL specimens only), is one component of a comprehensive MRSA colonization surveillance program. It is not intended to diagnose MRSA infection nor to guide or monitor treatment for MRSA infections. RESULT CALLED TO, READ BACK BY AND VERIFIED WITH: CYNTHIA BURGESS @ 0021 ON 12/17/2016 BY CAF   Culture, blood (Routine X 2) w Reflex to ID Panel     Status: None (Preliminary result)   Collection Time: 12/23/2016  1:43 PM  Result Value Ref Range Status   Specimen Description BLOOD RIGHT ARM  Final   Special Requests   Final    BOTTLES DRAWN AEROBIC AND ANAEROBIC AER 6CC ANA 4CC   Culture NO GROWTH < 24 HOURS  Final   Report Status PENDING  Incomplete  Culture, blood (Routine X 2) w Reflex to ID Panel     Status: None (Preliminary result)   Collection Time: 12/25/2016  1:43 PM  Result Value Ref Range Status   Specimen Description BLOOD LEFT HAND  Final   Special Requests   Final    BOTTLES DRAWN AEROBIC AND  ANAEROBIC AER 6CC ANA [place]   Culture NO GROWTH < 24 HOURS  Final   Report Status PENDING  Incomplete    Coagulation Studies:  Recent Labs  12/18/2016 2135  LABPROT 19.0*   INR 1.58    Urinalysis:   Recent Labs Lab 01/02/2017 1350  COLORURINE YELLOW*  LABSPEC 1.017  PHURINE 5.0  GLUCOSEU NEGATIVE  HGBUR NEGATIVE  BILIRUBINUR NEGATIVE  KETONESUR NEGATIVE  PROTEINUR 100*  NITRITE NEGATIVE  LEUKOCYTESUR TRACE*    Lipid Panel:     Component Value Date/Time   CHOL 108 01/01/2017 0514   TRIG 74 12/09/2016 0514   HDL 43 12/24/2016 0514   CHOLHDL 2.5 12/27/2016 0514   VLDL 15 12/14/2016 0514   LDLCALC 50 12/10/2016 0514    HgbA1C: No results found for: HGBA1C  Urine Drug Screen:  No results found for: LABOPIA, COCAINSCRNUR, LABBENZ, AMPHETMU, THCU, LABBARB  Alcohol Level: No results for input(s): ETH in the last 168 hours.  Other results: EKG: junctional rhythm at 52 bpm.  Imaging: Dg Chest 1 View  Result Date: 12/20/2016 CLINICAL DATA:  Shortness of breath . EXAM: CHEST 1 VIEW COMPARISON:  12/28/2016 . FINDINGS: Interim placement of NG tube, its tip is below the left hemidiaphragm. Endotracheal tube, right IJ line stable position. Cardiomegaly with bilateral pulmonary alveolar infiltrates consistent with CHF. Bilateral pneumonia cannot be excluded. No pneumothorax. IMPRESSION: 1. Interim placement of NG tube, its tip is below the left hemidiaphragm. Endotracheal tube and right IJ line in stable position. 2. Cardiomegaly with bilateral pulmonary infiltrates consistent pulmonary edema. Bilateral pneumonia cannot be excluded. No change from prior exam . Electronically Signed   By: Marcello Moores  Register   On: 12/20/2016 06:55   Dg Chest 1 View  Result Date: 01/03/2017 CLINICAL DATA:  Status post intubation and central line placement EXAM: CHEST 1 VIEW COMPARISON:  12/18/2016 FINDINGS: Cardiac shadow is stable. Diffuse bilateral airspace disease is again seen. Endotracheal tube is again seen in satisfactory position. Left subclavian central line extends in the left internal jugular vein stable from the prior study. No acute bony abnormality is noted.  IMPRESSION: Stable bilateral airspace disease. Stable appearance of endotracheal tube and left subclavian vein central catheter with the tip in the left internal jugular vein Electronically Signed   By: Inez Catalina M.D.   On: 12/24/2016 19:23   Dg Chest 1 View  Result Date: 01/02/2017 CLINICAL DATA:  Dyspnea per physician order. Last night seizure-like activity with vomiting and shortness of breath, Pt was admitted 01/04/2017 for bilateral chest wall pain, shortness of breath, dyspnea on exertion, and generalized weakness. EXAM: CHEST 1 VIEW COMPARISON:  12/18/2016 FINDINGS: There are bilateral interstitial and alveolar airspace opacities involving the upper and lower lungs. There are trace bilateral pleural effusions. There is no pneumothorax. There is stable cardiomegaly. The osseous structures are unremarkable. IMPRESSION: Bilateral interstitial and alveolar airspace opacities throughout bilateral lungs which may reflect florid pulmonary edema versus multi lobar pneumonia. Electronically Signed   By: Kathreen Devoid   On: 12/11/2016 13:15   Dg Abd 1 View  Result Date: 12/26/2016 CLINICAL DATA:  OG tube placement EXAM: ABDOMEN - 1 VIEW COMPARISON:  None. FINDINGS: Enteric tube with tip in the left upper quadrant consistent with location in the body of the stomach. Right femoral venous catheter with tip over the inferior vena caval atrial junction. Scattered gas and stool throughout the colon. No bowel distention. Atelectasis or infiltration seen in the lung bases. Lumbar scoliosis convex towards the left  with degenerative change. Aortic calcification. IMPRESSION: Enteric tube tip is in the left upper quadrant consistent with location in the body of the stomach. Electronically Signed   By: Lucienne Capers M.D.   On: 12/30/2016 23:21   Ct Head Wo Contrast  Result Date: 12/18/2016 CLINICAL DATA:  Acute onset of stroke-like symptoms. Difficulty with speech, and possible seizure like activity. Initial  encounter. EXAM: CT HEAD WITHOUT CONTRAST TECHNIQUE: Contiguous axial images were obtained from the base of the skull through the vertex without intravenous contrast. COMPARISON:  CT of the head performed 12/08/2016 FINDINGS: Brain: No evidence of acute infarction, hemorrhage, hydrocephalus, extra-axial collection or mass lesion/mass effect. Prominence of the sulci suggests mild cortical volume loss. Mild cerebellar atrophy is noted. Mild periventricular white matter change likely reflects small vessel ischemic microangiopathy. The brainstem and fourth ventricle are within normal limits. The basal ganglia are unremarkable in appearance. The cerebral hemispheres demonstrate grossly normal gray-white differentiation. No mass effect or midline shift is seen. Vascular: No hyperdense vessel or unexpected calcification. Skull: There is no evidence of fracture; visualized osseous structures are unremarkable in appearance. Sinuses/Orbits: The visualized portions of the orbits are within normal limits. The paranasal sinuses and mastoid air cells are well-aerated. Other: No significant soft tissue abnormalities are seen. IMPRESSION: 1. No acute intracranial pathology seen on CT. 2. Mild cortical volume loss and scattered small vessel ischemic microangiopathy. Electronically Signed   By: Garald Balding M.D.   On: 12/18/2016 17:44   Mr Brain Wo Contrast  Result Date: 12/18/2016 CLINICAL DATA:  Episode of seizure like activity. Slurred speech. Tremors. Disorientation. EXAM: MRI HEAD WITHOUT CONTRAST TECHNIQUE: Multiplanar, multiecho pulse sequences of the brain and surrounding structures were obtained without intravenous contrast. COMPARISON:  Head CT same day.  Head CT 12/08/2016. FINDINGS: Brain: Diffusion imaging does not show any acute or subacute infarction. The study does suffer from considerable motion degradation. The brainstem is negative. There are old small vessel infarctions affecting the cerebellum bilaterally.  Cerebral hemispheres show moderate chronic small-vessel ischemic changes of the deep white matter. No large vessel territory infarction. No mass lesion, hemorrhage, hydrocephalus or extra-axial collection. No pituitary mass. Vascular: Major vessels at the base of the brain show flow. Skull and upper cervical spine: Negative Sinuses/Orbits: Clear/normal Other: None significant IMPRESSION: No acute finding. 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 Moderate chronic small-vessel ischemic changes of the cerebral hemispheric white matter. No specific cause of seizure identified. These results will be called to the ordering clinician or representative by the Radiologist Assistant, and communication documented in the PACS or zVision Dashboard. Electronically Signed   By: Nelson Chimes M.D.   On: 12/18/2016 20:56   US Renal  Result Date: 12/23/2016 CLINICAL DATA:  Chronic kidney disease EXAM: RENAL / URINARY TRACT ULTRASOUND COMPLETE COMPARISON:  None. FINDINGS: Right Kidney: Length: 8.4 cm. No renal mass or hydronephrosis. Renal cortical thinning with increased echogenicity. Left Kidney: Length: 9.2 cm. No renal mass or hydronephrosis. Renal cortical thinning with increased echogenicity. Bladder: Foley catheter in place.  Decompressed. IMPRESSION: 1. Atrophic bilateral kidneys with renal cortical thinning and increased renal echogenicity as can be seen with medical renal disease. Electronically Signed   By: Kathreen Devoid   On: 12/27/2016 13:20   Dg Chest Port 1 View  Result Date: 12/31/2016 CLINICAL DATA:  Central line placement EXAM: PORTABLE CHEST 1 VIEW COMPARISON:  Film from earlier in the same day FINDINGS: Bilateral airspace disease is again identified. The endotracheal tube is in satisfactory  position. The previously seen left subclavian central line has been removed. A new right jugular central line is noted in satisfactory position. No pneumothorax is noted. IMPRESSION: Status post right jugular  central line placement in satisfactory position. The remainder of the exam is stable. Electronically Signed   By: Inez Catalina M.D.   On: 12/18/2016 21:14   Dg Chest Port 1 View  Result Date: 01/05/2017 CLINICAL DATA:  Endotracheal tube placement. Central line placement. EXAM: PORTABLE CHEST 1 VIEW COMPARISON:  Earlier same day FINDINGS: Defibrillator pads overlie the chest. Pacemaker has been placed from an inferior approach with the tip in the region of the right ventricle. Endotracheal tube has its tip 5 cm above the carina. Left subclavian central line turns up the left internal jugular vein, tip not visible. Diffuse airspace density persists but is improved. IMPRESSION: Less diffuse airspace density/ improved pulmonary aeration. Endotracheal tube tip 5 cm above the carina. Left subclavian central line turns up the left IJ, tip not visible. Pacemaker placed from below has the lead tip in the region of the right ventricle. These results will be called to the ordering clinician or representative by the Radiologist Assistant, and communication documented in the PACS or zVision Dashboard. Electronically Signed   By: Nelson Chimes M.D.   On: 12/09/2016 18:46     Assessment/Plan: 76 year old female presenting with PNA now with shaking episodes.  Patient with multiple metabolic issues including PNA, acute on chronic renal disease, hypoxia, low grade temp and elevated white blood cell count.  - No more shaking activity. EEG pending for today - d/w husband at bedside - appreciate nephrology evaluation, as renal function is improving - will follow up EEG - no further imaging at this point

## 2016-12-21 ENCOUNTER — Inpatient Hospital Stay: Payer: Medicare HMO

## 2016-12-21 DIAGNOSIS — J9601 Acute respiratory failure with hypoxia: Secondary | ICD-10-CM

## 2016-12-21 DIAGNOSIS — R579 Shock, unspecified: Secondary | ICD-10-CM

## 2016-12-21 DIAGNOSIS — J810 Acute pulmonary edema: Secondary | ICD-10-CM

## 2016-12-21 DIAGNOSIS — R569 Unspecified convulsions: Secondary | ICD-10-CM

## 2016-12-21 DIAGNOSIS — J690 Pneumonitis due to inhalation of food and vomit: Secondary | ICD-10-CM

## 2016-12-21 LAB — GLUCOSE, CAPILLARY
GLUCOSE-CAPILLARY: 122 mg/dL — AB (ref 65–99)
GLUCOSE-CAPILLARY: 125 mg/dL — AB (ref 65–99)
Glucose-Capillary: 108 mg/dL — ABNORMAL HIGH (ref 65–99)
Glucose-Capillary: 79 mg/dL (ref 65–99)
Glucose-Capillary: 90 mg/dL (ref 65–99)
Glucose-Capillary: 91 mg/dL (ref 65–99)

## 2016-12-21 LAB — COMPREHENSIVE METABOLIC PANEL
ALBUMIN: 2.3 g/dL — AB (ref 3.5–5.0)
ALT: 59 U/L — AB (ref 14–54)
AST: 65 U/L — AB (ref 15–41)
Alkaline Phosphatase: 121 U/L (ref 38–126)
Anion gap: 10 (ref 5–15)
BUN: 66 mg/dL — ABNORMAL HIGH (ref 6–20)
CALCIUM: 8.1 mg/dL — AB (ref 8.9–10.3)
CHLORIDE: 103 mmol/L (ref 101–111)
CO2: 25 mmol/L (ref 22–32)
Creatinine, Ser: 2.91 mg/dL — ABNORMAL HIGH (ref 0.44–1.00)
GFR calc Af Amer: 17 mL/min — ABNORMAL LOW (ref >60)
GFR, EST NON AFRICAN AMERICAN: 15 mL/min — AB (ref >60)
Glucose, Bld: 135 mg/dL — ABNORMAL HIGH (ref 65–99)
Potassium: 4.2 mmol/L (ref 3.5–5.1)
Sodium: 138 mmol/L (ref 135–145)
TOTAL PROTEIN: 6 g/dL — AB (ref 6.5–8.1)
Total Bilirubin: 0.7 mg/dL (ref 0.3–1.2)

## 2016-12-21 LAB — URINE CULTURE: Culture: NO GROWTH

## 2016-12-21 LAB — TYPE AND SCREEN
ABO/RH(D): A POS
Antibody Screen: NEGATIVE
UNIT DIVISION: 0

## 2016-12-21 LAB — CBC
HEMATOCRIT: 24.4 % — AB (ref 35.0–47.0)
Hemoglobin: 8 g/dL — ABNORMAL LOW (ref 12.0–16.0)
MCH: 29.5 pg (ref 26.0–34.0)
MCHC: 33 g/dL (ref 32.0–36.0)
MCV: 89.3 fL (ref 80.0–100.0)
PLATELETS: 257 10*3/uL (ref 150–440)
RBC: 2.73 MIL/uL — AB (ref 3.80–5.20)
RDW: 14.4 % (ref 11.5–14.5)
WBC: 28.5 10*3/uL — ABNORMAL HIGH (ref 3.6–11.0)

## 2016-12-21 LAB — PROCALCITONIN: PROCALCITONIN: 79.11 ng/mL

## 2016-12-21 LAB — HEMOGLOBIN A1C
Hgb A1c MFr Bld: 6 % — ABNORMAL HIGH (ref 4.8–5.6)
Mean Plasma Glucose: 126 mg/dL

## 2016-12-21 LAB — VANCOMYCIN, RANDOM: Vancomycin Rm: 18

## 2016-12-21 MED ORDER — FUROSEMIDE 10 MG/ML IJ SOLN
80.0000 mg | Freq: Once | INTRAMUSCULAR | Status: AC
  Administered 2016-12-21: 80 mg via INTRAVENOUS
  Filled 2016-12-21: qty 8

## 2016-12-21 MED ORDER — ORAL CARE MOUTH RINSE
15.0000 mL | Freq: Two times a day (BID) | OROMUCOSAL | Status: DC
  Administered 2016-12-22 – 2016-12-25 (×7): 15 mL via OROMUCOSAL

## 2016-12-21 MED ORDER — CHLORHEXIDINE GLUCONATE 0.12 % MT SOLN
15.0000 mL | Freq: Two times a day (BID) | OROMUCOSAL | Status: DC
  Administered 2016-12-21 – 2016-12-25 (×8): 15 mL via OROMUCOSAL
  Filled 2016-12-21 (×5): qty 15

## 2016-12-21 MED ORDER — ASPIRIN 325 MG PO TABS: 325.0000 mg | ORAL_TABLET | Freq: Every day | ORAL | Status: DC

## 2016-12-21 MED ORDER — FENTANYL CITRATE (PF) 100 MCG/2ML IJ SOLN
12.5000 ug | INTRAMUSCULAR | Status: DC | PRN
  Administered 2016-12-22: 25 ug via INTRAVENOUS
  Administered 2016-12-22 (×3): 12.5 ug via INTRAVENOUS
  Administered 2016-12-22: 25 ug via INTRAVENOUS
  Administered 2016-12-22: 12.5 ug via INTRAVENOUS
  Administered 2016-12-23 – 2016-12-26 (×14): 25 ug via INTRAVENOUS
  Filled 2016-12-21 (×20): qty 2

## 2016-12-21 MED ORDER — CEFEPIME-DEXTROSE 1 GM/50ML IV SOLR
1.0000 g | INTRAVENOUS | Status: DC
  Administered 2016-12-21 – 2016-12-27 (×7): 1 g via INTRAVENOUS
  Filled 2016-12-21 (×9): qty 50

## 2016-12-21 NOTE — Progress Notes (Addendum)
Patient Name: Chloe Sanchez Date of Encounter: 12/21/2016  Primary Cardiologist: New to [facility] - consult by [facility] Problem List     Active Problems:   Pneumonia   Complete heart block (HCC)   Acute renal failure (HCC)   Acute chronic obstructive pulmonary disease with respiratory distress (HCC)   Chronic kidney disease, stage III (moderate)   Cough   Dyspnea   Generalized weakness   Pleural effusion   Anemia in chronic kidney disease   Hypotension   Bradycardia   Respiratory failure (HCC)   Sepsis (HCC)   Seizure-like activity (HCC)     Subjective   Remains critically ill. Intubated in the PM of 12/09/2016. Status post emergent venous temp wire on 2/11 by Dr. Kirke Corin, MD. Pressors discontinued. Has mostly been sinus tachycardia in the low 100's bpm with occasional period of what appears to be AV dissociation. Echo showed normal EF as below. FiO2 40%. Sitting up. Awake, opens eyes when spoke too. SCr improving.   Inpatient Medications    Scheduled Meds: . [START ON 12/22/2016] aspirin  325 mg Oral Daily  . ceFEPime (MAXIPIME) IV  2 g Intravenous Q24H  . famotidine (PEPCID) IV  20 mg Intravenous Q24H  . heparin  5,000 Units Subcutaneous Q8H  . insulin aspart  0-9 Units Subcutaneous Q4H  . insulin glargine  5 Units Subcutaneous QHS  . ipratropium-albuterol  3 mL Nebulization Q6H  . levothyroxine  50 mcg Intravenous Daily  . mupirocin ointment  1 application Nasal BID  . sodium chloride flush  10-40 mL Intracatheter Q12H   Continuous Infusions:  PRN Meds: acetaminophen, fentaNYL (SUBLIMAZE) injection, lip balm   Vital Signs    Vitals:   12/21/16 0430 12/21/16 0500 12/21/16 0822 12/21/16 0824  BP: (!) 98/56 (!) 98/59    Pulse: (!) 108 (!) 108    Resp: 19 17    Temp:      TempSrc:      SpO2: 99% 98% 100% 99%  Weight:      Height:        Intake/Output Summary (Last 24 hours) at 12/21/16 1032 Last data filed at 12/21/16 0400  Gross per 24 hour  Intake            435.75 ml  Output              795 ml  Net          -359.25 ml   Filed Weights   01/02/2017 1612 12/31/2016 2010  Weight: 139 lb (63 kg) 138 lb 14.4 oz (63 kg)    Physical Exam    GEN: Critically ill appearing and intubated.  HEENT: Grossly normal.  Neck: Supple, no JVD, carotid bruits, or masses. Prior central line dressed right side. Cardiac: Tachycardic, III/VI systolic murmur RUSB radiating to the neck, no rubs, or gallops. No clubbing, cyanosis, edema.  Radials/DP/PT 2+ and equal bilaterally.  Respiratory:  Decreased breath sounds bilaterally with scattered rhonchi. GI: Soft, nontender, nondistended, BS + x 4. MS: No deformity or atrophy. Skin: Cool and dry, no rash. Neuro:  Intubated. Psych: Intubated.  Labs    CBC  Recent Labs  12/20/16 0416 12/21/16 0533  WBC 20.3* 28.5*  HGB 9.5* 8.0*  HCT 29.2* 24.4*  MCV 92.0 89.3  PLT 332 257   Basic Metabolic Panel  Recent Labs  12/28/2016 2351 12/20/16 0416 12/21/16 0533  NA 133* 136 138  K 3.8 3.4* 4.2  CL 100* 99*  103  CO2 20* 26 25  GLUCOSE 600* 619* 135*  BUN 53* 58* 66*  CREATININE 3.54* 3.48* 2.91*  CALCIUM 8.0* 7.9* 8.1*  MG 1.9 1.9  --   PHOS 7.3*  --   --    Liver Function Tests  Recent Labs  12/23/2016 2351 12/21/16 0533  AST 136* 65*  ALT 74* 59*  ALKPHOS 152* 121  BILITOT 0.8 0.7  PROT 6.3* 6.0*  ALBUMIN 2.5* 2.3*   No results for input(s): LIPASE, AMYLASE in the last 72 hours. Cardiac Enzymes  Recent Labs  12/28/2016 0514 12/10/2016 1113 12/10/2016 1819 12/13/2016 2351  CKTOTAL  --   --   --  40  TROPONINI 0.32* 0.20* 0.17*  --    BNP Invalid input(s): POCBNP D-Dimer No results for input(s): DDIMER in the last 72 hours. Hemoglobin A1C  Recent Labs  12/20/16 0416  HGBA1C 6.0*   Fasting Lipid Panel  Recent Labs  12/25/2016 0514  CHOL 108  HDL 43  LDLCALC 50  TRIG 74  CHOLHDL 2.5   Thyroid Function Tests No results for input(s): TSH, T4TOTAL, T3FREE, THYROIDAB in the  last 72 hours.  Invalid input(s): FREET3  Telemetry    Sinus tachycardia, low 100's bpm with occasional episodes of AV dissociation - Personally Reviewed  ECG    n/a - Personally Reviewed  Radiology    Dg Chest 1 View  Result Date: 12/20/2016 CLINICAL DATA:  Shortness of breath . EXAM: CHEST 1 VIEW COMPARISON:  12/26/2016 . FINDINGS: Interim placement of NG tube, its tip is below the left hemidiaphragm. Endotracheal tube, right IJ line stable position. Cardiomegaly with bilateral pulmonary alveolar infiltrates consistent with CHF. Bilateral pneumonia cannot be excluded. No pneumothorax. IMPRESSION: 1. Interim placement of NG tube, its tip is below the left hemidiaphragm. Endotracheal tube and right IJ line in stable position. 2. Cardiomegaly with bilateral pulmonary infiltrates consistent pulmonary edema. Bilateral pneumonia cannot be excluded. No change from prior exam . Electronically Signed   By: Maisie Fus  Register   On: 12/20/2016 06:55   Dg Chest 1 View  Result Date: 12/15/2016 CLINICAL DATA:  Status post intubation and central line placement EXAM: CHEST 1 VIEW COMPARISON:  01/05/2017 FINDINGS: Cardiac shadow is stable. Diffuse bilateral airspace disease is again seen. Endotracheal tube is again seen in satisfactory position. Left subclavian central line extends in the left internal jugular vein stable from the prior study. No acute bony abnormality is noted. IMPRESSION: Stable bilateral airspace disease. Stable appearance of endotracheal tube and left subclavian vein central catheter with the tip in the left internal jugular vein Electronically Signed   By: Alcide Clever M.D.   On: 01/02/2017 19:23   Dg Chest 1 View  Result Date: 12/28/2016 CLINICAL DATA:  Dyspnea per physician order. Last night seizure-like activity with vomiting and shortness of breath, Pt was admitted 2017-01-13 for bilateral chest wall pain, shortness of breath, dyspnea on exertion, and generalized weakness. EXAM: CHEST  1 VIEW COMPARISON:  12/18/2016 FINDINGS: There are bilateral interstitial and alveolar airspace opacities involving the upper and lower lungs. There are trace bilateral pleural effusions. There is no pneumothorax. There is stable cardiomegaly. The osseous structures are unremarkable. IMPRESSION: Bilateral interstitial and alveolar airspace opacities throughout bilateral lungs which may reflect florid pulmonary edema versus multi lobar pneumonia. Electronically Signed   By: Elige Ko   On: 01/03/2017 13:15   Dg Abd 1 View  Result Date: 01/04/2017 CLINICAL DATA:  OG tube placement EXAM: ABDOMEN - 1 VIEW  COMPARISON:  None. FINDINGS: Enteric tube with tip in the left upper quadrant consistent with location in the body of the stomach. Right femoral venous catheter with tip over the inferior vena caval atrial junction. Scattered gas and stool throughout the colon. No bowel distention. Atelectasis or infiltration seen in the lung bases. Lumbar scoliosis convex towards the left with degenerative change. Aortic calcification. IMPRESSION: Enteric tube tip is in the left upper quadrant consistent with location in the body of the stomach. Electronically Signed   By: Burman Nieves M.D.   On: 01/15/2017 23:21   US Renal  Result Date: Jan 15, 2017 CLINICAL DATA:  Chronic kidney disease EXAM: RENAL / URINARY TRACT ULTRASOUND COMPLETE COMPARISON:  None. FINDINGS: Right Kidney: Length: 8.4 cm. No renal mass or hydronephrosis. Renal cortical thinning with increased echogenicity. Left Kidney: Length: 9.2 cm. No renal mass or hydronephrosis. Renal cortical thinning with increased echogenicity. Bladder: Foley catheter in place.  Decompressed. IMPRESSION: 1. Atrophic bilateral kidneys with renal cortical thinning and increased renal echogenicity as can be seen with medical renal disease. Electronically Signed   By: Elige Ko   On: Jan 15, 2017 13:20   Dg Chest Port 1 View  Result Date: 12/21/2016 CLINICAL DATA:   Respiratory failure. EXAM: PORTABLE CHEST 1 VIEW COMPARISON:  12/20/2016. FINDINGS: Endotracheal tube, right IJ line, NG tube in stable position. Heart size stable. Prominent bilateral pulmonary infiltrates again noted. No pleural effusion. No pneumothorax. IMPRESSION: 1.  Lines and tubes in stable position. 2. Stable cardiomegaly. Prominent bilateral pulmonary infiltrates/edema again noted. No significant interim change. Electronically Signed   By: Maisie Fus  Register   On: 12/21/2016 06:39   Dg Chest Port 1 View  Result Date: 2017/01/15 CLINICAL DATA:  Central line placement EXAM: PORTABLE CHEST 1 VIEW COMPARISON:  Film from earlier in the same day FINDINGS: Bilateral airspace disease is again identified. The endotracheal tube is in satisfactory position. The previously seen left subclavian central line has been removed. A new right jugular central line is noted in satisfactory position. No pneumothorax is noted. IMPRESSION: Status post right jugular central line placement in satisfactory position. The remainder of the exam is stable. Electronically Signed   By: Alcide Clever M.D.   On: 01/15/17 21:14   Dg Chest Port 1 View  Result Date: 01/15/2017 CLINICAL DATA:  Endotracheal tube placement. Central line placement. EXAM: PORTABLE CHEST 1 VIEW COMPARISON:  Earlier same day FINDINGS: Defibrillator pads overlie the chest. Pacemaker has been placed from an inferior approach with the tip in the region of the right ventricle. Endotracheal tube has its tip 5 cm above the carina. Left subclavian central line turns up the left internal jugular vein, tip not visible. Diffuse airspace density persists but is improved. IMPRESSION: Less diffuse airspace density/ improved pulmonary aeration. Endotracheal tube tip 5 cm above the carina. Left subclavian central line turns up the left IJ, tip not visible. Pacemaker placed from below has the lead tip in the region of the right ventricle. These results will be called to the  ordering clinician or representative by the Radiologist Assistant, and communication documented in the PACS or zVision Dashboard. Electronically Signed   By: Paulina Fusi M.D.   On: 01-15-17 18:46    Cardiac Studies   Echo 01-15-17: Study Conclusions  - Left ventricle: The cavity size was normal. Systolic function was   normal. The estimated ejection fraction was in the range of 60%   to 65%. Wall motion was normal; there were no regional wall  motion abnormalities. The study is not technically sufficient to   allow evaluation of LV diastolic function. - Aortic valve: Transvalvular velocity was increased. There was   severe stenosis. There was mild regurgitation. Peak velocity (S):   482 cm/s. Mean gradient (S): 51 mm Hg. Valve area (VTI): 0.68   cm^2. - Mitral valve: There was moderate regurgitation. - Left atrium: The atrium was mildly dilated. - Right ventricle: Systolic function was normal. - Tricuspid valve: There was mild-moderate regurgitation. - Pulmonary arteries: Systolic pressure was severely elevated. PA   peak pressure: 72 mm Hg (S).  Impressions:  - Rhythm is complete heart block.  Patient Profile     76 y.o. female with history of CHF, CKD, DM, depression, HTN who presented to the EF with CHB, elevated troponin, and sepsis with PNA.  Assessment & Plan    1. Complete heart block: -Status post venous temp wire on 2/11 with sinus tachycardia at this time -Will have EP evaluate patient today for possible removal of temp wire -Echo showed preserved EF as above -Likely contributing to her respiratory failure requiring intubation -Await improvement in status from acute illness to likely place PPM -Cannot rule out cardiogenic shock  2. Acute respiratory failure/PNA requiring intubation: -Likely multifactorial including PNA and acute diastolic CHF -Not on sedation -Will flutter eyes when spoken to -Wean as able  3. Sepsis with PNA: -Cannot rule out septic  shock -ABX per IM -Pressors per PCCM  4. Acute diastolic CHF: -MAP 76 this morning -Limit IV fluids -Perhaps in the next 24-36 hours could start IV Lasix for diuresis -No beta blocker 2/2 the above  5. Acute on chronic renal failure: -Appears stable at this time -No indication for dialysis at this time -Likey ATN in the setting of her infection and hypotension  6. Severe aortic stenosis: -Measurements are severe on echocardiogram done today -Previously noted to be moderate one year ago -Likely contributing to CHF symptoms -Will need to stabilize respiratory status, heart rate, -Determine if permanent pacer is indicated, at that time could discuss with surgical team in [place] whether she would be a candidate for TAVR, will need R/LHC prior  7. Anemia: -Unable to to exclude iron deficiency vs anemia of chronic disease -Maintain hgb > 8.5  8. Elevated troponin: -Likely supply demand ischemia 2/2 the above -May require ischemic evaluation pending her recovery given her valvular heart disease   Signed, Carola FrostRyan Dunn, PA-C [facility] Pager: [phone] 12/21/2016, 10:32 AM   Patient seen and examined. Rhythm strips are reviewed back to her admission. She has been in atrial tachycardia with variable conduction. I think is reasonable to think about pulling out her temporary pacemaker. Indication for pacing is not clear certainly, the timing would be deferred until after the resolution of her current illness.  I agree with Dr. Knute NeuG that TAVR would be a reasonable thing to consider once she has resolved her current illness.  Agree with above.

## 2016-12-21 NOTE — Progress Notes (Signed)
Pharmacy Antibiotic Note  Chloe Sanchez is a 76 y.o. female admitted on 12/15/2016 with pneumonia.  Pharmacy has been consulted for vancomycin and cefepime dosing.  Plan: Patient in ARF with no plans for dialysis per nephrology. Vancomycin random level of 20 mcg/ml followed by 18 mcg/ml 24 hours later. Ke= 0.0044 h-1. T1/2= 157.5 h. Vancomycin level should reach 15 mcg/ml around 0700 2/15. Will check another vancomycin level with AM labs 2/15 and redose from there.   Will decrease cefepime to 1 g iv q 24 hours.   Height: 5\' 2"  (157.5 cm) Weight: 138 lb 14.4 oz (63 kg) IBW/kg (Calculated) : 50.1  Temp (24hrs), Avg:98 F (36.7 C), Min:97.7 F (36.5 C), Max:98.3 F (36.8 C)   Recent Labs Lab 12/18/16 0753 2017-01-01 0514  01/01/2017 1819 01-01-17 2135 01/01/17 2140 01-Jan-2017 2351 12/20/16 0416 12/20/16 0421 12/20/16 1406 12/21/16 0533 12/21/16 1340  WBC 7.8 11.9*  --   --  19.0*  --   --  20.3*  --   --  28.5*  --   CREATININE 2.47* 2.65*  --  3.44*  --   --  3.54* 3.48*  --   --  2.91*  --   LATICACIDVEN  --   --   --   --   --  3.9*  --   --  2.4*  --   --   --   VANCORANDOM  --   --   < >  --   --   --   --   --   --  20  --  18  < > = values in this interval not displayed.  Estimated Creatinine Clearance: 14.6 mL/min (by C-G formula based on SCr of 2.91 mg/dL (H)).    No Known Allergies  Anti-infectives    Start     Dose/Rate Route Frequency Ordered Stop   01-01-2017 2200  ceFEPIme (MAXIPIME) 2 g in dextrose 5 % 50 mL IVPB     2 g 100 mL/hr over 30 Minutes Intravenous Every 24 hours January 01, 2017 2111     01-Jan-2017 1800  vancomycin (VANCOCIN) IVPB 1000 mg/200 mL premix  Status:  Discontinued     1,000 mg 200 mL/hr over 60 Minutes Intravenous Every 36 hours 12/18/16 1151 Jan 01, 2017 1458   Jan 01, 2017 1530  vancomycin (VANCOCIN) IVPB 1000 mg/200 mL premix  Status:  Discontinued     1,000 mg 200 mL/hr over 60 Minutes Intravenous Every 36 hours 01/01/17 1458 12/20/16 1535   12/18/16 2100   piperacillin-tazobactam (ZOSYN) IVPB 3.375 g  Status:  Discontinued     3.375 g 12.5 mL/hr over 240 Minutes Intravenous Every 12 hours 12/18/16 1149 2017/01/01 2103   12/18/16 1800  vancomycin (VANCOCIN) IVPB 1000 mg/200 mL premix  Status:  Discontinued     1,000 mg 200 mL/hr over 60 Minutes Intravenous Every 36 hours 12/18/16 0053 12/18/16 1149   12/18/16 0200  piperacillin-tazobactam (ZOSYN) IVPB 3.375 g  Status:  Discontinued     3.375 g 12.5 mL/hr over 240 Minutes Intravenous Every 8 hours 12/18/16 0049 12/18/16 1149   12/18/16 0200  vancomycin (VANCOCIN) IVPB 1000 mg/200 mL premix     1,000 mg 200 mL/hr over 60 Minutes Intravenous  Once 12/18/16 0053 12/18/16 0301   12/17/16 1800  levofloxacin (LEVAQUIN) tablet 750 mg  Status:  Discontinued     750 mg Oral Every 48 hours 12/17/16 1621 12/18/16 0031   12/17/16 1000  cefTRIAXone (ROCEPHIN) IVPB 1 g  Status:  Discontinued     1 g 100 mL/hr over 30 Minutes Intravenous Every 24 hours 01/04/2017 1853 12/17/16 1600   12/13/2016 1900  cefTRIAXone (ROCEPHIN) 1 g in dextrose 5 % 50 mL IVPB  Status:  Discontinued     1 g 100 mL/hr over 30 Minutes Intravenous Every 24 hours 12/30/2016 1849 12/26/2016 1853   12/09/2016 1900  azithromycin (ZITHROMAX) 250 mg in dextrose 5 % 125 mL IVPB  Status:  Discontinued     250 mg 125 mL/hr over 60 Minutes Intravenous Every 24 hours 12/26/2016 1849 12/17/16 1600   12/10/2016 1800  cefTRIAXone (ROCEPHIN) IVPB 1 g     1 g 100 mL/hr over 30 Minutes Intravenous  Once 01/04/2017 1746 12/25/2016 2316   01/03/2017 1745  cefTRIAXone (ROCEPHIN) 1 g in dextrose 5 % 50 mL IVPB  Status:  Discontinued     1 g 100 mL/hr over 30 Minutes Intravenous  Once 12/27/2016 1742 12/20/2016 1746   01/02/2017 1745  azithromycin (ZITHROMAX) 500 mg in dextrose 5 % 250 mL IVPB     500 mg 250 mL/hr over 60 Minutes Intravenous  Once 12/22/2016 1742 12/28/2016 1907     Recent Results (from the past 240 hour(s))  MRSA PCR Screening     Status: Abnormal   Collection  Time: 12/11/2016 10:51 PM  Result Value Ref Range Status   MRSA by PCR POSITIVE (A) NEGATIVE Final    Comment:        The GeneXpert MRSA Assay (FDA approved for NASAL specimens only), is one component of a comprehensive MRSA colonization surveillance program. It is not intended to diagnose MRSA infection nor to guide or monitor treatment for MRSA infections. RESULT CALLED TO, READ BACK BY AND VERIFIED WITH: CYNTHIA BURGESS @ 0021 ON 12/17/2016 BY CAF   Culture, blood (Routine X 2) w Reflex to ID Panel     Status: None (Preliminary result)   Collection Time: 12/09/2016  1:43 PM  Result Value Ref Range Status   Specimen Description BLOOD RIGHT ARM  Final   Special Requests   Final    BOTTLES DRAWN AEROBIC AND ANAEROBIC AER 6CC ANA 4CC   Culture NO GROWTH 2 DAYS  Final   Report Status PENDING  Incomplete  Culture, blood (Routine X 2) w Reflex to ID Panel     Status: None (Preliminary result)   Collection Time: 01/04/2017  1:43 PM  Result Value Ref Range Status   Specimen Description BLOOD LEFT HAND  Final   Special Requests   Final    BOTTLES DRAWN AEROBIC AND ANAEROBIC AER 6CC ANA 7CC   Culture NO GROWTH 2 DAYS  Final   Report Status PENDING  Incomplete  Urine culture     Status: None   Collection Time: 12/17/2016  1:50 PM  Result Value Ref Range Status   Specimen Description URINE, RANDOM  Final   Special Requests NONE  Final   Culture   Final    NO GROWTH Performed at [facility], [address], [place], [place] [address]    Report Status 12/21/2016 FINAL  Final  Culture, respiratory (NON-Expectorated)     Status: None (Preliminary result)   Collection Time: 12/20/16  3:51 PM  Result Value Ref Range Status   Specimen Description TRACHEAL ASPIRATE  Final   Special Requests NONE  Final   Gram Stain   Final    ABUNDANT WBC PRESENT,BOTH PMN AND MONONUCLEAR FEW YEAST    Culture   Final  NO GROWTH < 24 HOURS Performed at [facility], [address]  [place], [place] [address]    Report Status PENDING  Incomplete    Thank you for allowing pharmacy to be a part of this patient's care.  Valentina GuChristy, Aubryanna Nesheim D 12/21/2016 2:47 PM

## 2016-12-21 NOTE — Progress Notes (Signed)
Patient extubated to 2lnc per Dr Sung AmabileSimonds order. Sat on 2 liters is 92% at this time and patient tolerating well. Will continue to monitor.

## 2016-12-21 NOTE — Plan of Care (Signed)
Problem: Nutritional: Goal: Intake of prescribed amount of daily calories will improve Outcome: Not Progressing Patient still NPO. No PO intake attempted since patient had to be place on bipap post intubation.  Problem: Respiratory: Goal: Ability to maintain a clear airway and adequate ventilation will improve Outcome: Progressing Patient extubated at 09:55 after successful spontaneous breathing trial today. Patient alert and only disoriented to time. While intubated and initally post extubation patient had clear upper lungs and fine crackles in lower lungs. After intubation, patient initally was on 2L nasal cannula but pulse oximetry level decreased throughout day despite RN and RT increasing oxygen provided and crackles had spread to upper lobes despite IV lasix being administered after extubation, so patient placed on bipap midday/early afternoon (which MD had anticipated so he had ordered prn bipap order and warned RT and RN). During all of that time, patient had no complaints of shortness of breath and appeared to be resting comfortably. After bipap, patient still had no complaints of shortness of breath but O2 sats stabilized in upper 90s and crackles only present in lower lobes.

## 2016-12-21 NOTE — Progress Notes (Signed)
Subjective:   Patient is seating upright on Vent.  Vent dependent at present Sinus rhythm now but tachycardic.  S Cr improving now to 2.91 UOP 295 cc but had lower intake. Some urine in bag.  Off vasopressin.    Objective:  Vital signs in last 24 hours:  Temp:  [97.7 F (36.5 C)-98.3 F (36.8 C)] 97.9 F (36.6 C) (02/13 0400) Pulse Rate:  [61-117] 108 (02/13 0500) Resp:  [12-24] 17 (02/13 0500) BP: (91-127)/(38-68) 98/59 (02/13 0500) SpO2:  [95 %-100 %] 99 % (02/13 0824) FiO2 (%):  [40 %-50 %] 40 % (02/13 0824)  Weight change:  Filed Weights   01/04/2017 1612 12/30/2016 2010  Weight: 63 kg (139 lb) 63 kg (138 lb 14.4 oz)    Intake/Output:    Intake/Output Summary (Last 24 hours) at 12/21/16 0929 Last data filed at 12/21/16 0400  Gross per 24 hour  Intake           727.59 ml  Output              795 ml  Net           -67.41 ml     Physical Exam: General: Critically ill  HEENT ETT  Neck No masses  Pulm/lungs Vent assisted, coarse breath sounds  CVS/Heart Sinus tachycardia rhythm, 2/6 systolic murmur  Abdomen:  Soft  Extremities: No significant edema  Neurologic: Follows simple commands  Skin: No acute rashes   Foley present       Basic Metabolic Panel:   Recent Labs Lab 12/18/2016 0514 12/20/2016 1819 01/01/2017 2351 12/20/16 0416 12/21/16 0533  NA 136 136 133* 136 138  K 5.0 4.1 3.8 3.4* 4.2  CL 107 107 100* 99* 103  CO2 20* 18* 20* 26 25  GLUCOSE 114* 246* 600* 619* 135*  BUN 38* 52* 53* 58* 66*  CREATININE 2.65* 3.44* 3.54* 3.48* 2.91*  CALCIUM 8.6* 8.9 8.0* 7.9* 8.1*  MG 1.7  --  1.9 1.9  --   PHOS  --   --  7.3*  --   --      CBC:  Recent Labs Lab 12/18/16 0753 12/21/2016 0514 12/12/2016 2135 12/20/16 0416 12/21/16 0533  WBC 7.8 11.9* 19.0* 20.3* 28.5*  HGB 7.8* 8.2* 7.4* 9.5* 8.0*  HCT 23.1* 24.9* 23.2* 29.2* 24.4*  MCV 89.8 89.7 91.7 92.0 89.3  PLT 258 288 312 332 257      Microbiology:  Recent Results (from the past 720  hour(s))  MRSA PCR Screening     Status: Abnormal   Collection Time: 12/11/2016 10:51 PM  Result Value Ref Range Status   MRSA by PCR POSITIVE (A) NEGATIVE Final    Comment:        The GeneXpert MRSA Assay (FDA approved for NASAL specimens only), is one component of a comprehensive MRSA colonization surveillance program. It is not intended to diagnose MRSA infection nor to guide or monitor treatment for MRSA infections. RESULT CALLED TO, READ BACK BY AND VERIFIED WITH: CYNTHIA BURGESS @ 0021 ON 12/17/2016 BY CAF   Culture, blood (Routine X 2) w Reflex to ID Panel     Status: None (Preliminary result)   Collection Time: 12/27/2016  1:43 PM  Result Value Ref Range Status   Specimen Description BLOOD RIGHT ARM  Final   Special Requests   Final    BOTTLES DRAWN AEROBIC AND ANAEROBIC AER 6CC ANA 4CC   Culture NO GROWTH 2 DAYS  Final   Report Status  PENDING  Incomplete  Culture, blood (Routine X 2) w Reflex to ID Panel     Status: None (Preliminary result)   Collection Time: 12/23/2016  1:43 PM  Result Value Ref Range Status   Specimen Description BLOOD LEFT HAND  Final   Special Requests   Final    BOTTLES DRAWN AEROBIC AND ANAEROBIC AER 6CC ANA Show Low   Culture NO GROWTH 2 DAYS  Final   Report Status PENDING  Incomplete  Urine culture     Status: None   Collection Time: 01/04/2017  1:50 PM  Result Value Ref Range Status   Specimen Description URINE, RANDOM  Final   Special Requests NONE  Final   Culture   Final    NO GROWTH Performed at [facility], [place] [address], [place], [place] [address]    Report Status 12/21/2016 FINAL  Final  Culture, respiratory (NON-Expectorated)     Status: None (Preliminary result)   Collection Time: 12/20/16  3:51 PM  Result Value Ref Range Status   Specimen Description TRACHEAL ASPIRATE  Final   Special Requests NONE  Final   Gram Stain   Final    ABUNDANT WBC PRESENT,BOTH PMN AND MONONUCLEAR FEW YEAST Performed at [facility], [place] [address] [place], [place] [address]    Culture PENDING  Incomplete   Report Status PENDING  Incomplete    Coagulation Studies:  Recent Labs  12/20/2016 2135  LABPROT 19.0*  INR 1.58    Urinalysis:  Recent Labs  12/17/2016 1350  COLORURINE YELLOW*  LABSPEC 1.017  PHURINE 5.0  GLUCOSEU NEGATIVE  HGBUR NEGATIVE  BILIRUBINUR NEGATIVE  KETONESUR NEGATIVE  PROTEINUR 100*  NITRITE NEGATIVE  LEUKOCYTESUR TRACE*      Imaging: Dg Chest 1 View  Result Date: 12/20/2016 CLINICAL DATA:  Shortness of breath . EXAM: CHEST 1 VIEW COMPARISON:  12/22/2016 . FINDINGS: Interim placement of NG tube, its tip is below the left hemidiaphragm. Endotracheal tube, right IJ line stable position. Cardiomegaly with bilateral pulmonary alveolar infiltrates consistent with CHF. Bilateral pneumonia cannot be excluded. No pneumothorax. IMPRESSION: 1. Interim placement of NG tube, its tip is below the left hemidiaphragm. Endotracheal tube and right IJ line in stable position. 2. Cardiomegaly with bilateral pulmonary infiltrates consistent pulmonary edema. Bilateral pneumonia cannot be excluded. No change from prior exam . Electronically Signed   By: Marcello Moores  Register   On: 12/20/2016 06:55   Dg Chest 1 View  Result Date: 12/27/2016 CLINICAL DATA:  Status post intubation and central line placement EXAM: CHEST 1 VIEW COMPARISON:  12/22/2016 FINDINGS: Cardiac shadow is stable. Diffuse bilateral airspace disease is again seen. Endotracheal tube is again seen in satisfactory position. Left subclavian central line extends in the left internal jugular vein stable from the prior study. No acute bony abnormality is noted. IMPRESSION: Stable bilateral airspace disease. Stable appearance of endotracheal tube and left subclavian vein central catheter with the tip in the left internal jugular vein Electronically Signed   By: Inez Catalina M.D.   On: 12/20/2016 19:23   Dg Chest 1 View  Result Date: 01/02/2017 CLINICAL DATA:   Dyspnea per physician order. Last night seizure-like activity with vomiting and shortness of breath, Pt was admitted 01/03/2017 for bilateral chest wall pain, shortness of breath, dyspnea on exertion, and generalized weakness. EXAM: CHEST 1 VIEW COMPARISON:  12/18/2016 FINDINGS: There are bilateral interstitial and alveolar airspace opacities involving the upper and lower lungs. There are trace bilateral pleural effusions. There is  no pneumothorax. There is stable cardiomegaly. The osseous structures are unremarkable. IMPRESSION: Bilateral interstitial and alveolar airspace opacities throughout bilateral lungs which may reflect florid pulmonary edema versus multi lobar pneumonia. Electronically Signed   By: Kathreen Devoid   On: 12/30/2016 13:15   Dg Abd 1 View  Result Date: 12/22/2016 CLINICAL DATA:  OG tube placement EXAM: ABDOMEN - 1 VIEW COMPARISON:  None. FINDINGS: Enteric tube with tip in the left upper quadrant consistent with location in the body of the stomach. Right femoral venous catheter with tip over the inferior vena caval atrial junction. Scattered gas and stool throughout the colon. No bowel distention. Atelectasis or infiltration seen in the lung bases. Lumbar scoliosis convex towards the left with degenerative change. Aortic calcification. IMPRESSION: Enteric tube tip is in the left upper quadrant consistent with location in the body of the stomach. Electronically Signed   By: Lucienne Capers M.D.   On: 12/23/2016 23:21   US Renal  Result Date: 12/17/2016 CLINICAL DATA:  Chronic kidney disease EXAM: RENAL / URINARY TRACT ULTRASOUND COMPLETE COMPARISON:  None. FINDINGS: Right Kidney: Length: 8.4 cm. No renal mass or hydronephrosis. Renal cortical thinning with increased echogenicity. Left Kidney: Length: 9.2 cm. No renal mass or hydronephrosis. Renal cortical thinning with increased echogenicity. Bladder: Foley catheter in place.  Decompressed. IMPRESSION: 1. Atrophic bilateral kidneys with  renal cortical thinning and increased renal echogenicity as can be seen with medical renal disease. Electronically Signed   By: Kathreen Devoid   On: 01/02/2017 13:20   Dg Chest Port 1 View  Result Date: 12/21/2016 CLINICAL DATA:  Respiratory failure. EXAM: PORTABLE CHEST 1 VIEW COMPARISON:  12/20/2016. FINDINGS: Endotracheal tube, right IJ line, NG tube in stable position. Heart size stable. Prominent bilateral pulmonary infiltrates again noted. No pleural effusion. No pneumothorax. IMPRESSION: 1.  Lines and tubes in stable position. 2. Stable cardiomegaly. Prominent bilateral pulmonary infiltrates/edema again noted. No significant interim change. Electronically Signed   By: Marcello Moores  Register   On: 12/21/2016 06:39   Dg Chest Port 1 View  Result Date: 12/31/2016 CLINICAL DATA:  Central line placement EXAM: PORTABLE CHEST 1 VIEW COMPARISON:  Film from earlier in the same day FINDINGS: Bilateral airspace disease is again identified. The endotracheal tube is in satisfactory position. The previously seen left subclavian central line has been removed. A new right jugular central line is noted in satisfactory position. No pneumothorax is noted. IMPRESSION: Status post right jugular central line placement in satisfactory position. The remainder of the exam is stable. Electronically Signed   By: Inez Catalina M.D.   On: 12/14/2016 21:14   Dg Chest Port 1 View  Result Date: 12/26/2016 CLINICAL DATA:  Endotracheal tube placement. Central line placement. EXAM: PORTABLE CHEST 1 VIEW COMPARISON:  Earlier same day FINDINGS: Defibrillator pads overlie the chest. Pacemaker has been placed from an inferior approach with the tip in the region of the right ventricle. Endotracheal tube has its tip 5 cm above the carina. Left subclavian central line turns up the left internal jugular vein, tip not visible. Diffuse airspace density persists but is improved. IMPRESSION: Less diffuse airspace density/ improved pulmonary aeration.  Endotracheal tube tip 5 cm above the carina. Left subclavian central line turns up the left IJ, tip not visible. Pacemaker placed from below has the lead tip in the region of the right ventricle. These results will be called to the ordering clinician or representative by the Radiologist Assistant, and communication documented in the PACS or zVision Dashboard.  Electronically Signed   By: Nelson Chimes M.D.   On: 12/15/2016 18:46     Medications:   . sodium chloride 10 mL/hr at 12/20/16 1800  . norepinephrine (LEVOPHED) Adult infusion Stopped (12/20/16 1706)   . aspirin  325 mg Per NG tube Daily  . ceFEPime (MAXIPIME) IV  2 g Intravenous Q24H  . chlorhexidine gluconate (MEDLINE KIT)  15 mL Mouth Rinse BID  . famotidine (PEPCID) IV  20 mg Intravenous Q24H  . heparin  5,000 Units Subcutaneous Q8H  . insulin aspart  0-9 Units Subcutaneous Q4H  . insulin glargine  5 Units Subcutaneous QHS  . ipratropium-albuterol  3 mL Nebulization Q6H  . levothyroxine  50 mcg Intravenous Daily  . mouth rinse  15 mL Mouth Rinse Q2H  . mupirocin ointment  1 application Nasal BID  . sodium chloride flush  10-40 mL Intracatheter Q12H   acetaminophen, bisacodyl, dextrose, fentaNYL (SUBLIMAZE) injection, lip balm, midazolam, polyethylene glycol  Assessment/ Plan:  76 y.o. female female with diabetes mellitus type II, hypertension, peripheral vascular disease, obstructive sleep apnea, congestive heart failure, anemia, hypothyroidism, sjogren's, hyperlipidemia, seizure disorder and questionable lupus, who was admitted to [facility] on 12/28/2016   1. ARF on CKD s 4. Baseline Cr 1.77/GFR 27 (admission 12/25/2016) 2. B/l atrophic kidneys noted on u/s 3. Acute respiratory failure - vent dependent 4. DM-2/CKD   Underlying CKD is likely secondary to DM and atherosclerosis ARF is likely due to abnormal cardiorenal hemodymacs from Ao Stenosis and possibly ATN from recent events (hypotension)  PLAN S Cr is improving  No acute  indication for HD at present Monitor urine output. Will hold further lasix for now      LOS: 5 Ellory Khurana 2/13/20189:29 AM

## 2016-12-21 NOTE — Progress Notes (Signed)
During hand off report with previous shift RN, this RN noticed that left femoral arterial line leaking blood onto patient's bedpad. Reinforced dressing, changed pad, and gathered supplies to change dressing. Even though changed dressing, still large amount of blood leaking from site. RN notified MD and MD ordered arterial line pulled and pressure held at site. No new blood from site post arterial line removal. Blood pressure and heart rate stable at this time, no new orders for lab values. Team will continue to monitor.

## 2016-12-21 NOTE — Plan of Care (Signed)
Problem: Bowel/Gastric: Goal: Will not experience complications related to bowel motility Outcome: Not Progressing Patient with no charted bowel movement since 12/16/2016, RN gave prn miralax today before extubation and informed MD.

## 2016-12-21 NOTE — Progress Notes (Signed)
Increased to 50% due to sats of 89%, sats increased to 92%.

## 2016-12-21 NOTE — Progress Notes (Signed)
Brief Nutrition Note:   Pt extubated to 2L [place] this AM. NPO at present.  Labs and meds reviewed. Await diet progression post extubation. Pt will likely benefit from nutritional supplement once diet advanced.   Romelle Starcherate Tyrann Donaho MS, RD, LDN [phone] Pager  [phone] Weekend/On-Call Pager

## 2016-12-21 NOTE — Consult Note (Addendum)
[facility] [place] [facility] Consultation    Name: Chloe Sanchez MRN: [id number] DOB: 05-16-1941    ADMISSION DATE:  01/14/2017 CONSULTATION DATE:  01/03/2017  REFERRING MD : Dr. Imogene Burn  CHIEF COMPLAINT:  Dyspnea.    SUBJ:   Off vasopressors.Marland Kitchen RASS 0. + F/C. Passed SBT. Extubated and comfortable on [place].   VITAL SIGNS: Temp:  [97.7 F (36.5 C)-98.3 F (36.8 C)] 97.9 F (36.6 C) (02/13 0400) Pulse Rate:  [61-117] 108 (02/13 0500) Resp:  [12-24] 17 (02/13 0500) BP: (91-117)/(38-66) 98/59 (02/13 0500) SpO2:  [95 %-100 %] 99 % (02/13 0824) FiO2 (%):  [40 %-50 %] 40 % (02/13 0824) HEMODYNAMICS:   VENTILATOR SETTINGS: Vent Mode: PRVC FiO2 (%):  [40 %-50 %] 40 % Set Rate:  [14 bmp] 14 bmp Vt Set:  [450 mL] 450 mL PEEP:  [8 cmH20] 8 cmH20 Plateau Pressure:  [21 cmH20] 21 cmH20 INTAKE / OUTPUT:  Intake/Output Summary (Last 24 hours) at 12/21/16 1206 Last data filed at 12/21/16 0400  Gross per 24 hour  Intake            364.8 ml  Output              795 ml  Net           -430.2 ml    Physical Examination:  Vitals:   12/21/16 0430 12/21/16 0500 12/21/16 0822 12/21/16 0824  BP: (!) 98/56 (!) 98/59    Pulse: (!) 108 (!) 108    Resp: 19 17    Temp:      TempSrc:      SpO2: 99% 98% 100% 99%  Weight:      Height:       RASS 0, + F/C HEENT WNL JVP not well visualized Scattered rhonchi, no wheezes Reg, + syst M Abd soft, +BS Ext warm, no edema No focal neuro deficits  LABS: Reviewed   LABORATORY PANEL:   CBC  Recent Labs Lab 12/21/16 0533  WBC 28.5*  HGB 8.0*  HCT 24.4*  PLT 257    Chemistries   Recent Labs Lab 12/17/2016 2351 12/20/16 0416 12/21/16 0533  NA 133* 136 138  K 3.8 3.4* 4.2  CL 100* 99* 103  CO2 20* 26 25  GLUCOSE 600* 619* 135*  BUN 53* 58* 66*  CREATININE 3.54* 3.48* 2.91*  CALCIUM 8.0* 7.9* 8.1*  MG 1.9 1.9  --   PHOS 7.3*  --   --   AST 136*  --  65*  ALT 74*  --  59*  ALKPHOS 152*  --  121  BILITOT 0.8  --  0.7      Recent Labs Lab 12/20/16 1638 12/20/16 1951 12/20/16 2331 12/21/16 0333 12/21/16 0708 12/21/16 1136  GLUCAP 85 111* 138* 122* 125* 108*    Recent Labs Lab 12/10/2016 2036 01/01/2017 2350 12/20/16 0421  PHART 7.01* 7.17* 7.28*  PCO2ART 59* 52* 54*  PO2ART 225* 128* 163*    Recent Labs Lab 01/02/2017 2351 12/21/16 0533  AST 136* 65*  ALT 74* 59*  ALKPHOS 152* 121  BILITOT 0.8 0.7  ALBUMIN 2.5* 2.3*    Cardiac Enzymes  Recent Labs Lab 12/31/2016 1819  TROPONINI 0.17*   PCT 2.34 >59.38> 79.11  RADIOLOGY:  CXR: Persistent R> L AS disease    ASSESSMENT/PLAN   76 year old female with acute respiratory failure, with acute aspiration pneumonitis/pneumonia with severe pulmonary edema. 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  1) Acute hypoxic respiratory failure 2) Extensive bilateral pulmonary infiltrates - edema and or PNA 3) suspected aspiration PNA 4) Critical aortic stenosis 5) CHB 6) 3rd degree HB - resolved 7) Cardiogenic and septic shock - resolved 8) AKI - nonoliguric 9) Nausea, resolved 10) Encephalopathy, resolved  1) Extubated this AM under my supervision 2) Lasix X 1 ordered 3) Cont Vanc/Cefepime - cont to follow procalcitonin 4) Monitor BMET intermittently, Monitor I/Os, Correct electrolytes as indicated 5) Further cardiac eval and mgmt per Cards 6) Renal following 7) Need to address whether we would re-intubate  CCM time: 35 mins The above time includes time spent in consultation with patient and/or family members and reviewing care plan on multidisciplinary rounds  Billy Fischeravid Elzie Knisley, MD PCCM service Mobile [phone] Pager [phone]   12/21/2016

## 2016-12-21 NOTE — Progress Notes (Signed)
Patient placed into PSV of 5/5 and 35% FIO2 for SBT.Patient is awake and following commands well.  RN and MD aware, pt tol well at this time and will continue to monitor.

## 2016-12-21 NOTE — Consult Note (Signed)
Significant improvement overnight.  Pt s/p extubation, following commands.     Past Medical History:  Diagnosis Date  . Anemia   . Anxiety   . Arthritis   . CHF (congestive heart failure) (HCC)   . Chronic kidney disease    CKD  . Depression   . Diabetes mellitus without complication (HCC)   . Gout   . Hypertension   . Hypothyroidism   . On supplemental oxygen therapy    AT NIGHT  . Polyneuropathy (HCC)   . RLS (restless legs syndrome)   . Sjogren's syndrome (HCC)   . Sleep apnea     Past Surgical History:  Procedure Laterality Date  . BACK SURGERY     lumbar  . CHOLECYSTECTOMY    . FOOT SURGERY Bilateral   . TEMPORARY PACEMAKER N/A December 25, 2016   Procedure: Temporary Pacemaker;  Surgeon: Iran Ouch, MD;  Location: [facility]IVE [facility];  Service: Cardiovascular;  Laterality: N/A;  . TOTAL KNEE ARTHROPLASTY Right 07/17/2015   Procedure: TOTAL KNEE ARTHROPLASTY;  Surgeon: Kennedy Bucker, MD;  Location: [facility] ORS;  Service: Orthopedics;  Laterality: Right;    Family History  Problem Relation Age of Onset  . Lung cancer Sister     Social History:  reports that she has never smoked. She quit smokeless tobacco use about 17 months ago. Her smokeless tobacco use included Snuff. She reports that she does not drink alcohol or use drugs.  No Known Allergies  Medications:  I have reviewed the patient's current medications. Prior to Admission:  Prescriptions Prior to Admission  Medication Sig Dispense Refill Last Dose  . albuterol (PROVENTIL HFA;VENTOLIN HFA) 108 (90 BASE) MCG/ACT inhaler Inhale 2 puffs into the lungs every 4 (four) hours as needed for wheezing or shortness of breath.   prn at prn  . allopurinol (ZYLOPRIM) 100 MG tablet Take 100 mg by mouth daily.   01/03/2017 at 0800  . amitriptyline (ELAVIL) 50 MG tablet Take 50 mg by mouth at bedtime.   12/15/2016 at 2000  . amLODipine-atorvastatin (CADUET) 5-10 MG per tablet Take 1 tablet by mouth daily.   12/23/2016 at 0800  .  aspirin 81 MG tablet Take 81 mg by mouth daily.   12/15/2016 at 0800  . Carboxymethylcellul-Glycerin (OPTIVE) 0.5-0.9 % SOLN Apply 1-2 drops to eye 3 (three) times daily as needed.   prn at prn  . colchicine 0.6 MG tablet Take 0.6 mg by mouth daily. 2 BY MOUTH FIRST SIGN OF GOUT THEN 1 DAILY AS NEEDED   prn at prn  . ergocalciferol (VITAMIN D2) 50000 UNITS capsule Take 50,000 Units by mouth every 30 (thirty) days.   Past Month at Unknown time  . esomeprazole (NEXIUM) 40 MG capsule Take 40 mg by mouth daily at 12 noon. AM   12/15/2016 at 0800  . ferrous sulfate 325 (65 FE) MG tablet Take 325 mg by mouth daily with breakfast.   01/01/2017 at 0800  . gabapentin (NEURONTIN) 600 MG tablet Take 600 mg by mouth 2 (two) times daily.    12/12/2016 at 0800  . guaiFENesin-dextromethorphan (ROBITUSSIN DM) 100-10 MG/5ML syrup Take 5 mLs by mouth every 4 (four) hours as needed for cough.   prn at prn  . levothyroxine (SYNTHROID, LEVOTHROID) 88 MCG tablet Take 88 mcg by mouth daily before breakfast.   01/02/2017 at 0730  . methadone (DOLOPHINE) 10 MG tablet Take 1 tablet (10 mg total) by mouth daily with lunch. (Patient taking differently: Take 10 mg by mouth  3 (three) times daily. 1 tablet at 0800, 1 tablet at 1330, 2 tablets at 2100) 30 tablet 0   . polyethylene glycol (MIRALAX / GLYCOLAX) packet Take 17 g by mouth daily.   prn at prn  . pregabalin (LYRICA) 150 MG capsule Take 1 capsule (150 mg total) by mouth 2 (two) times daily. 30 capsule 0 12/09/2016 at 0800  . rOPINIRole (REQUIP) 1 MG tablet Take 1 mg by mouth 2 (two) times daily. 0800, 1930   12/25/2016 at 0800  . tiotropium (SPIRIVA) 18 MCG inhalation capsule Place 18 mcg into inhaler and inhale daily.   12/18/2016 at 0800  . trolamine salicylate (ASPERCREME) 10 % cream Apply 1 application topically 3 (three) times daily as needed for muscle pain.   prn at prn  . methadone (DOLOPHINE) 10 MG tablet Take 3 tablets (30 mg total) by mouth daily with breakfast. (Patient not  taking: Reported on 12/28/2016) 30 tablet 0 Not Taking at Unknown time   Scheduled: . [START ON 12/22/2016] aspirin  325 mg Oral Daily  . ceFEPime (MAXIPIME) IV  2 g Intravenous Q24H  . famotidine (PEPCID) IV  20 mg Intravenous Q24H  . heparin  5,000 Units Subcutaneous Q8H  . insulin aspart  0-9 Units Subcutaneous Q4H  . insulin glargine  5 Units Subcutaneous QHS  . ipratropium-albuterol  3 mL Nebulization Q6H  . levothyroxine  50 mcg Intravenous Daily  . mupirocin ointment  1 application Nasal BID  . sodium chloride flush  10-40 mL Intracatheter Q12H    ROS: Patient unable to provide due to mental status  Physical Examination: Blood pressure (!) 98/59, pulse (!) 108, temperature 97.9 F (36.6 C), temperature source Oral, resp. rate 17, height 5\' 2"  (1.575 m), weight 63 kg (138 lb 14.4 oz), SpO2 99 %.  HEENT-  Normocephalic, no lesions, without obvious abnormality.  Normal external eye and conjunctiva.  Normal TM's bilaterally.  Normal auditory canals and external ears. Normal external nose, mucus membranes and septum.  Normal pharynx. Cardiovascular- S1, S2 normal, pulses palpable throughout   Lungs- chest clear, no wheezing, rales, normal symmetric air entry Abdomen- soft, non-tender; bowel sounds normal; no masses,  no organomegaly Extremities- no edema Lymph-no adenopathy palpable Musculoskeletal-no joint tenderness, deformity or swelling Skin-Miultiple scars due to past surgeries    Neurological Examination  Mental Status: Alert, oriented, thought content appropriate.  Speech fluent without evidence of aphasia.  Able to follow 3 step commands without difficulty. Cranial Nerves: II: Discs flat bilaterally; Visual fields grossly normal, pupils equal, round, reactive to light and accommodation III,IV, VI: ptosis not present, extra-ocular motions intact bilaterally V,VII: smile symmetric, facial light touch sensation normal bilaterally VIII: hearing normal bilaterally IX,X: gag  reflex present XI: bilateral shoulder shrug XII: midline tongue extension Motor: Right : Upper extremity   5/5    Left:     Upper extremity   5/5  Lower extremity   5/5     Lower extremity   5/5 Tone and bulk:normal tone throughout; no atrophy noted Sensory: Pinprick and light touch intact throughout, bilaterally Deep Tendon Reflexes: + and symmetric throughout Plantars: Right: downgoing   Left: downgoing Cerebellar: normal finger-to-nose, normal rapid alternating movements and normal heel-to-shin test Gait: not tested         Laboratory Studies:   Basic Metabolic Panel:  Recent Labs Lab 12/11/2016 0514 12/16/2016 1819 12/25/2016 2351 12/20/16 0416 12/21/16 0533  NA 136 136 133* 136 138  K 5.0 4.1 3.8 3.4* 4.2  CL  107 107 100* 99* 103  CO2 20* 18* 20* 26 25  GLUCOSE 114* 246* 600* 619* 135*  BUN 38* 52* 53* 58* 66*  CREATININE 2.65* 3.44* 3.54* 3.48* 2.91*  CALCIUM 8.6* 8.9 8.0* 7.9* 8.1*  MG 1.7  --  1.9 1.9  --   PHOS  --   --  7.3*  --   --     Liver Function Tests:  Recent Labs Lab 12/12/2016 2351 12/21/16 0533  AST 136* 65*  ALT 74* 59*  ALKPHOS 152* 121  BILITOT 0.8 0.7  PROT 6.3* 6.0*  ALBUMIN 2.5* 2.3*   No results for input(s): LIPASE, AMYLASE in the last 168 hours. No results for input(s): AMMONIA in the last 168 hours.  CBC:  Recent Labs Lab 12/18/16 0753 12/31/2016 0514 12/23/2016 2135 12/20/16 0416 12/21/16 0533  WBC 7.8 11.9* 19.0* 20.3* 28.5*  HGB 7.8* 8.2* 7.4* 9.5* 8.0*  HCT 23.1* 24.9* 23.2* 29.2* 24.4*  MCV 89.8 89.7 91.7 92.0 89.3  PLT 258 288 312 332 257    Cardiac Enzymes:  Recent Labs Lab 12/23/16 1613 12/11/2016 0514 12/18/2016 1113 12/13/2016 1819 12/20/2016 2351  CKTOTAL  --   --   --   --  40  TROPONINI <0.03 0.32* 0.20* 0.17*  --     BNP: Invalid input(s): POCBNP  CBG:  Recent Labs Lab 12/20/16 1638 12/20/16 1951 12/20/16 2331 12/21/16 0333 12/21/16 0708  GLUCAP 85 111* 138* 122* 125*     Microbiology: Results for orders placed or performed during the hospital encounter of December 23, 2016  MRSA PCR Screening     Status: Abnormal   Collection Time: 12/23/16 10:51 PM  Result Value Ref Range Status   MRSA by PCR POSITIVE (A) NEGATIVE Final    Comment:        The GeneXpert MRSA Assay (FDA approved for NASAL specimens only), is one component of a comprehensive MRSA colonization surveillance program. It is not intended to diagnose MRSA infection nor to guide or monitor treatment for MRSA infections. RESULT CALLED TO, READ BACK BY AND VERIFIED WITH: CYNTHIA BURGESS @ 0021 ON 12/17/2016 BY CAF   Culture, blood (Routine X 2) w Reflex to ID Panel     Status: None (Preliminary result)   Collection Time: 12/30/2016  1:43 PM  Result Value Ref Range Status   Specimen Description BLOOD RIGHT ARM  Final   Special Requests   Final    BOTTLES DRAWN AEROBIC AND ANAEROBIC AER 6CC ANA 4CC   Culture NO GROWTH 2 DAYS  Final   Report Status PENDING  Incomplete  Culture, blood (Routine X 2) w Reflex to ID Panel     Status: None (Preliminary result)   Collection Time: 12/16/2016  1:43 PM  Result Value Ref Range Status   Specimen Description BLOOD LEFT HAND  Final   Special Requests   Final    BOTTLES DRAWN AEROBIC AND ANAEROBIC AER 6CC ANA 7CC   Culture NO GROWTH 2 DAYS  Final   Report Status PENDING  Incomplete  Urine culture     Status: None   Collection Time: 12/12/2016  1:50 PM  Result Value Ref Range Status   Specimen Description URINE, RANDOM  Final   Special Requests NONE  Final   Culture   Final    NO GROWTH Performed at [facility], [address] [place], [place] [address]    Report Status 12/21/2016 FINAL  Final  Culture, respiratory (NON-Expectorated)     Status:  None (Preliminary result)   Collection Time: 12/20/16  3:51 PM  Result Value Ref Range Status   Specimen Description TRACHEAL ASPIRATE  Final   Special Requests NONE  Final   Gram Stain   Final    ABUNDANT  WBC PRESENT,BOTH PMN AND MONONUCLEAR FEW YEAST    Culture   Final    NO GROWTH < 24 HOURS Performed at [facility], [address], [place], [place] [address]    Report Status PENDING  Incomplete    Coagulation Studies:  Recent Labs  12/10/2016 2135  LABPROT 19.0*  INR 1.58    Urinalysis:   Recent Labs Lab 01/01/2017 1350  COLORURINE YELLOW*  LABSPEC 1.017  PHURINE 5.0  GLUCOSEU NEGATIVE  HGBUR NEGATIVE  BILIRUBINUR NEGATIVE  KETONESUR NEGATIVE  PROTEINUR 100*  NITRITE NEGATIVE  LEUKOCYTESUR TRACE*    Lipid Panel:     Component Value Date/Time   CHOL 108 12/13/2016 0514   TRIG 74 12/15/2016 0514   HDL 43 12/09/2016 0514   CHOLHDL 2.5 12/24/2016 0514   VLDL 15 12/25/2016 0514   LDLCALC 50 01/01/2017 0514    HgbA1C:  Lab Results  Component Value Date   HGBA1C 6.0 (H) 12/20/2016    Urine Drug Screen:  No results found for: LABOPIA, COCAINSCRNUR, LABBENZ, AMPHETMU, THCU, LABBARB  Alcohol Level: No results for input(s): ETH in the last 168 hours.  Other results: EKG: junctional rhythm at 52 bpm.  Imaging: Dg Chest 1 View  Result Date: 12/20/2016 CLINICAL DATA:  Shortness of breath . EXAM: CHEST 1 VIEW COMPARISON:  12/17/2016 . FINDINGS: Interim placement of NG tube, its tip is below the left hemidiaphragm. Endotracheal tube, right IJ line stable position. Cardiomegaly with bilateral pulmonary alveolar infiltrates consistent with CHF. Bilateral pneumonia cannot be excluded. No pneumothorax. IMPRESSION: 1. Interim placement of NG tube, its tip is below the left hemidiaphragm. Endotracheal tube and right IJ line in stable position. 2. Cardiomegaly with bilateral pulmonary infiltrates consistent pulmonary edema. Bilateral pneumonia cannot be excluded. No change from prior exam . Electronically Signed   By: Maisie Fus  Register   On: 12/20/2016 06:55   Dg Chest 1 View  Result Date: 12/12/2016 CLINICAL DATA:  Status post intubation and central line placement  EXAM: CHEST 1 VIEW COMPARISON:  12/11/2016 FINDINGS: Cardiac shadow is stable. Diffuse bilateral airspace disease is again seen. Endotracheal tube is again seen in satisfactory position. Left subclavian central line extends in the left internal jugular vein stable from the prior study. No acute bony abnormality is noted. IMPRESSION: Stable bilateral airspace disease. Stable appearance of endotracheal tube and left subclavian vein central catheter with the tip in the left internal jugular vein Electronically Signed   By: Alcide Clever M.D.   On: 12/18/2016 19:23   Dg Chest 1 View  Result Date: 12/18/2016 CLINICAL DATA:  Dyspnea per physician order. Last night seizure-like activity with vomiting and shortness of breath, Pt was admitted 01/15/2017 for bilateral chest wall pain, shortness of breath, dyspnea on exertion, and generalized weakness. EXAM: CHEST 1 VIEW COMPARISON:  12/18/2016 FINDINGS: There are bilateral interstitial and alveolar airspace opacities involving the upper and lower lungs. There are trace bilateral pleural effusions. There is no pneumothorax. There is stable cardiomegaly. The osseous structures are unremarkable. IMPRESSION: Bilateral interstitial and alveolar airspace opacities throughout bilateral lungs which may reflect florid pulmonary edema versus multi lobar pneumonia. Electronically Signed   By: Elige Ko   On: 01/04/2017 13:15   Dg Abd 1 View  Result Date: 12/16/2016 CLINICAL DATA:  OG tube placement EXAM: ABDOMEN - 1 VIEW COMPARISON:  None. FINDINGS: Enteric tube with tip in the left upper quadrant consistent with location in the body of the stomach. Right femoral venous catheter with tip over the inferior vena caval atrial junction. Scattered gas and stool throughout the colon. No bowel distention. Atelectasis or infiltration seen in the lung bases. Lumbar scoliosis convex towards the left with degenerative change. Aortic calcification. IMPRESSION: Enteric tube tip is in the  left upper quadrant consistent with location in the body of the stomach. Electronically Signed   By: Burman Nieves M.D.   On: 12/27/2016 23:21   US Renal  Result Date: 12/26/2016 CLINICAL DATA:  Chronic kidney disease EXAM: RENAL / URINARY TRACT ULTRASOUND COMPLETE COMPARISON:  None. FINDINGS: Right Kidney: Length: 8.4 cm. No renal mass or hydronephrosis. Renal cortical thinning with increased echogenicity. Left Kidney: Length: 9.2 cm. No renal mass or hydronephrosis. Renal cortical thinning with increased echogenicity. Bladder: Foley catheter in place.  Decompressed. IMPRESSION: 1. Atrophic bilateral kidneys with renal cortical thinning and increased renal echogenicity as can be seen with medical renal disease. Electronically Signed   By: Elige Ko   On: 12/17/2016 13:20   Dg Chest Port 1 View  Result Date: 12/21/2016 CLINICAL DATA:  Respiratory failure. EXAM: PORTABLE CHEST 1 VIEW COMPARISON:  12/20/2016. FINDINGS: Endotracheal tube, right IJ line, NG tube in stable position. Heart size stable. Prominent bilateral pulmonary infiltrates again noted. No pleural effusion. No pneumothorax. IMPRESSION: 1.  Lines and tubes in stable position. 2. Stable cardiomegaly. Prominent bilateral pulmonary infiltrates/edema again noted. No significant interim change. Electronically Signed   By: Maisie Fus  Register   On: 12/21/2016 06:39   Dg Chest Port 1 View  Result Date: 12/26/2016 CLINICAL DATA:  Central line placement EXAM: PORTABLE CHEST 1 VIEW COMPARISON:  Film from earlier in the same day FINDINGS: Bilateral airspace disease is again identified. The endotracheal tube is in satisfactory position. The previously seen left subclavian central line has been removed. A new right jugular central line is noted in satisfactory position. No pneumothorax is noted. IMPRESSION: Status post right jugular central line placement in satisfactory position. The remainder of the exam is stable. Electronically Signed   By: Alcide Clever M.D.   On: 12/15/2016 21:14   Dg Chest Port 1 View  Result Date: 12/15/2016 CLINICAL DATA:  Endotracheal tube placement. Central line placement. EXAM: PORTABLE CHEST 1 VIEW COMPARISON:  Earlier same day FINDINGS: Defibrillator pads overlie the chest. Pacemaker has been placed from an inferior approach with the tip in the region of the right ventricle. Endotracheal tube has its tip 5 cm above the carina. Left subclavian central line turns up the left internal jugular vein, tip not visible. Diffuse airspace density persists but is improved. IMPRESSION: Less diffuse airspace density/ improved pulmonary aeration. Endotracheal tube tip 5 cm above the carina. Left subclavian central line turns up the left IJ, tip not visible. Pacemaker placed from below has the lead tip in the region of the right ventricle. These results will be called to the ordering clinician or representative by the Radiologist Assistant, and communication documented in the PACS or zVision Dashboard. Electronically Signed   By: Paulina Fusi M.D.   On: 12/25/2016 18:46     Assessment/Plan: 76 year old female presenting with PNA now with shaking episodes.  This is likely in setting of metabolic encephalopathy  Significant improvement overnight.  Pt s/p extubation, following commands.  EEG no seizure activity No further imaging/tesnting from neurological stand point Call with questions

## 2016-12-22 ENCOUNTER — Inpatient Hospital Stay: Payer: Medicare HMO

## 2016-12-22 DIAGNOSIS — R918 Other nonspecific abnormal finding of lung field: Secondary | ICD-10-CM

## 2016-12-22 DIAGNOSIS — N170 Acute kidney failure with tubular necrosis: Secondary | ICD-10-CM

## 2016-12-22 DIAGNOSIS — I350 Nonrheumatic aortic (valve) stenosis: Secondary | ICD-10-CM

## 2016-12-22 LAB — CBC
HCT: 27.1 % — ABNORMAL LOW (ref 35.0–47.0)
Hemoglobin: 9.1 g/dL — ABNORMAL LOW (ref 12.0–16.0)
MCH: 29.8 pg (ref 26.0–34.0)
MCHC: 33.7 g/dL (ref 32.0–36.0)
MCV: 88.5 fL (ref 80.0–100.0)
Platelets: 303 10*3/uL (ref 150–440)
RBC: 3.06 MIL/uL — ABNORMAL LOW (ref 3.80–5.20)
RDW: 14.8 % — AB (ref 11.5–14.5)
WBC: 33.4 10*3/uL — ABNORMAL HIGH (ref 3.6–11.0)

## 2016-12-22 LAB — GLUCOSE, CAPILLARY
GLUCOSE-CAPILLARY: 168 mg/dL — AB (ref 65–99)
GLUCOSE-CAPILLARY: 85 mg/dL (ref 65–99)
GLUCOSE-CAPILLARY: 89 mg/dL (ref 65–99)
GLUCOSE-CAPILLARY: 78 mg/dL (ref 65–99)
GLUCOSE-CAPILLARY: 112 mg/dL — AB (ref 65–99)
Glucose-Capillary: 111 mg/dL — ABNORMAL HIGH (ref 65–99)
Glucose-Capillary: 121 mg/dL — ABNORMAL HIGH (ref 65–99)
Glucose-Capillary: 44 mg/dL — CL (ref 65–99)
Glucose-Capillary: 60 mg/dL — ABNORMAL LOW (ref 65–99)
Glucose-Capillary: 80 mg/dL (ref 65–99)
Glucose-Capillary: 94 mg/dL (ref 65–99)

## 2016-12-22 LAB — BASIC METABOLIC PANEL
Anion gap: 7 (ref 5–15)
BUN: 62 mg/dL — AB (ref 6–20)
CHLORIDE: 105 mmol/L (ref 101–111)
CO2: 31 mmol/L (ref 22–32)
Calcium: 8.8 mg/dL — ABNORMAL LOW (ref 8.9–10.3)
Creatinine, Ser: 2.4 mg/dL — ABNORMAL HIGH (ref 0.44–1.00)
GFR calc Af Amer: 22 mL/min — ABNORMAL LOW (ref >60)
GFR calc non Af Amer: 19 mL/min — ABNORMAL LOW (ref >60)
Glucose, Bld: 85 mg/dL (ref 65–99)
Potassium: 3.5 mmol/L (ref 3.5–5.1)
SODIUM: 143 mmol/L (ref 135–145)

## 2016-12-22 LAB — PROCALCITONIN: Procalcitonin: 45.75 ng/mL

## 2016-12-22 MED ORDER — METOPROLOL TARTRATE 5 MG/5ML IV SOLN
2.5000 mg | INTRAVENOUS | Status: DC | PRN
  Administered 2016-12-22 – 2016-12-23 (×4): 2.5 mg via INTRAVENOUS
  Filled 2016-12-22 (×4): qty 5

## 2016-12-22 MED ORDER — LORAZEPAM 2 MG/ML IJ SOLN
1.0000 mg | Freq: Once | INTRAMUSCULAR | Status: AC
  Administered 2016-12-22: 0.5 mg via INTRAVENOUS

## 2016-12-22 MED ORDER — METOPROLOL TARTRATE 5 MG/5ML IV SOLN
5.0000 mg | Freq: Once | INTRAVENOUS | Status: AC
  Administered 2016-12-22: 5 mg via INTRAVENOUS
  Filled 2016-12-22: qty 5

## 2016-12-22 MED ORDER — LORAZEPAM 2 MG/ML IJ SOLN
0.5000 mg | INTRAMUSCULAR | Status: DC
  Administered 2016-12-22: 0.5 mg via INTRAVENOUS
  Filled 2016-12-22 (×2): qty 1

## 2016-12-22 MED ORDER — STERILE WATER FOR INJECTION IJ SOLN
INTRAMUSCULAR | Status: AC
  Administered 2016-12-22: 10 mL
  Filled 2016-12-22: qty 10

## 2016-12-22 MED ORDER — DEXTROSE 50 % IV SOLN
25.0000 mL | Freq: Once | INTRAVENOUS | Status: AC
  Administered 2016-12-22 (×2): 25 mL via INTRAVENOUS

## 2016-12-22 MED ORDER — DEXTROSE 50 % IV SOLN
INTRAVENOUS | Status: AC
  Administered 2016-12-22: 25 mL via INTRAVENOUS
  Filled 2016-12-22: qty 50

## 2016-12-22 MED ORDER — ASPIRIN 300 MG RE SUPP
300.0000 mg | Freq: Every day | RECTAL | Status: DC
  Administered 2016-12-22 – 2016-12-28 (×7): 300 mg via RECTAL
  Filled 2016-12-22 (×7): qty 1

## 2016-12-22 MED ORDER — POTASSIUM CHLORIDE 2 MEQ/ML IV SOLN
30.0000 meq | Freq: Once | INTRAVENOUS | Status: AC
  Administered 2016-12-22: 30 meq via INTRAVENOUS
  Filled 2016-12-22: qty 15

## 2016-12-22 MED ORDER — DEXTROSE 50 % IV SOLN
1.0000 | Freq: Once | INTRAVENOUS | Status: DC
  Filled 2016-12-22: qty 50

## 2016-12-22 MED ORDER — FUROSEMIDE 10 MG/ML IJ SOLN
80.0000 mg | Freq: Once | INTRAMUSCULAR | Status: AC
  Administered 2016-12-22: 80 mg via INTRAVENOUS
  Filled 2016-12-22: qty 8

## 2016-12-22 MED ORDER — LORAZEPAM 2 MG/ML IJ SOLN
1.0000 mg | INTRAMUSCULAR | Status: DC
  Administered 2016-12-22 – 2016-12-23 (×2): 1 mg via INTRAVENOUS
  Filled 2016-12-22 (×2): qty 1

## 2016-12-22 MED ORDER — LORAZEPAM 2 MG/ML IJ SOLN
INTRAMUSCULAR | Status: AC
  Administered 2016-12-22: 0.5 mg via INTRAVENOUS
  Filled 2016-12-22: qty 1

## 2016-12-22 MED ORDER — LORAZEPAM 2 MG/ML IJ SOLN
0.5000 mg | Freq: Once | INTRAMUSCULAR | Status: AC
  Administered 2016-12-22: 0.5 mg via INTRAVENOUS

## 2016-12-22 NOTE — Progress Notes (Signed)
Pharmacy Antibiotic Note  Chloe Sanchez is a 76 y.o. female admitted on 2016-12-28 with pneumonia.  Pharmacy has been consulted for vancomycin and cefepime dosing.  Plan: Patient in ARF with no plans for dialysis per nephrology. Vancomycin random level of 20 mcg/ml followed by 18 mcg/ml 24 hours later. Ke= 0.0044 h-1. T1/2= 157.5 h. Vancomycin level should reach 15 mcg/ml around 0700 2/15. Will check another vancomycin level with AM labs 2/15 and redose from there.   Will continue cefepime to 1 g iv q 24 hours.   Height: 5\' 2"  (157.5 cm) Weight: 138 lb 14.4 oz (63 kg) IBW/kg (Calculated) : 50.1  Temp (24hrs), Avg:97.9 F (36.6 C), Min:97.4 F (36.3 C), Max:98.3 F (36.8 C)   Recent Labs Lab 12/26/2016 0514  12/30/2016 1819 12/23/2016 2135 12/18/2016 2140 12/15/2016 2351 12/20/16 0416 12/20/16 0421 12/20/16 1406 12/21/16 0533 12/21/16 1340 12/22/16 0518  WBC 11.9*  --   --  19.0*  --   --  20.3*  --   --  28.5*  --  33.4*  CREATININE 2.65*  --  3.44*  --   --  3.54* 3.48*  --   --  2.91*  --  2.40*  LATICACIDVEN  --   --   --   --  3.9*  --   --  2.4*  --   --   --   --   VANCORANDOM  --   < >  --   --   --   --   --   --  20  --  18  --   < > = values in this interval not displayed.  Estimated Creatinine Clearance: 17.7 mL/min (by C-G formula based on SCr of 2.4 mg/dL (H)).    No Known Allergies  Anti-infectives    Start     Dose/Rate Route Frequency Ordered Stop   12/21/16 2200  ceFEPIme (MAXIPIME) 1 GM / 50mL IVPB premix     1 g 100 mL/hr over 30 Minutes Intravenous Every 24 hours 12/21/16 1458     12/31/2016 2200  ceFEPIme (MAXIPIME) 2 g in dextrose 5 % 50 mL IVPB  Status:  Discontinued     2 g 100 mL/hr over 30 Minutes Intravenous Every 24 hours 12/30/2016 2111 12/21/16 1458   12/15/2016 1800  vancomycin (VANCOCIN) IVPB 1000 mg/200 mL premix  Status:  Discontinued     1,000 mg 200 mL/hr over 60 Minutes Intravenous Every 36 hours 12/18/16 1151 12/30/2016 1458   12/16/2016 1530   vancomycin (VANCOCIN) IVPB 1000 mg/200 mL premix  Status:  Discontinued     1,000 mg 200 mL/hr over 60 Minutes Intravenous Every 36 hours 12/31/2016 1458 12/20/16 1535   12/18/16 2100  piperacillin-tazobactam (ZOSYN) IVPB 3.375 g  Status:  Discontinued     3.375 g 12.5 mL/hr over 240 Minutes Intravenous Every 12 hours 12/18/16 1149 12/21/2016 2103   12/18/16 1800  vancomycin (VANCOCIN) IVPB 1000 mg/200 mL premix  Status:  Discontinued     1,000 mg 200 mL/hr over 60 Minutes Intravenous Every 36 hours 12/18/16 0053 12/18/16 1149   12/18/16 0200  piperacillin-tazobactam (ZOSYN) IVPB 3.375 g  Status:  Discontinued     3.375 g 12.5 mL/hr over 240 Minutes Intravenous Every 8 hours 12/18/16 0049 12/18/16 1149   12/18/16 0200  vancomycin (VANCOCIN) IVPB 1000 mg/200 mL premix     1,000 mg 200 mL/hr over 60 Minutes Intravenous  Once 12/18/16 0053 12/18/16 0301   12/17/16 1800  levofloxacin (LEVAQUIN) tablet 750 mg  Status:  Discontinued     750 mg Oral Every 48 hours 12/17/16 1621 12/18/16 0031   12/17/16 1000  cefTRIAXone (ROCEPHIN) IVPB 1 g  Status:  Discontinued     1 g 100 mL/hr over 30 Minutes Intravenous Every 24 hours 06-02-2017 1853 12/17/16 1600   06-02-2017 1900  cefTRIAXone (ROCEPHIN) 1 g in dextrose 5 % 50 mL IVPB  Status:  Discontinued     1 g 100 mL/hr over 30 Minutes Intravenous Every 24 hours 06-02-2017 1849 06-02-2017 1853   06-02-2017 1900  azithromycin (ZITHROMAX) 250 mg in dextrose 5 % 125 mL IVPB  Status:  Discontinued     250 mg 125 mL/hr over 60 Minutes Intravenous Every 24 hours 06-02-2017 1849 12/17/16 1600   06-02-2017 1800  cefTRIAXone (ROCEPHIN) IVPB 1 g     1 g 100 mL/hr over 30 Minutes Intravenous  Once 06-02-2017 1746 06-02-2017 2316   06-02-2017 1745  cefTRIAXone (ROCEPHIN) 1 g in dextrose 5 % 50 mL IVPB  Status:  Discontinued     1 g 100 mL/hr over 30 Minutes Intravenous  Once 06-02-2017 1742 06-02-2017 1746   06-02-2017 1745  azithromycin (ZITHROMAX) 500 mg in dextrose 5 % 250 mL IVPB      500 mg 250 mL/hr over 60 Minutes Intravenous  Once 06-02-2017 1742 06-02-2017 1907     Recent Results (from the past 240 hour(s))  MRSA PCR Screening     Status: Abnormal   Collection Time: 06-02-2017 10:51 PM  Result Value Ref Range Status   MRSA by PCR POSITIVE (A) NEGATIVE Final    Comment:        The GeneXpert MRSA Assay (FDA approved for NASAL specimens only), is one component of a comprehensive MRSA colonization surveillance program. It is not intended to diagnose MRSA infection nor to guide or monitor treatment for MRSA infections. RESULT CALLED TO, READ BACK BY AND VERIFIED WITH: CYNTHIA BURGESS @ 0021 ON 12/17/2016 BY CAF   Culture, blood (Routine X 2) w Reflex to ID Panel     Status: None (Preliminary result)   Collection Time: 12/15/2016  1:43 PM  Result Value Ref Range Status   Specimen Description BLOOD RIGHT ARM  Final   Special Requests   Final    BOTTLES DRAWN AEROBIC AND ANAEROBIC AER 6CC ANA 4CC   Culture NO GROWTH 3 DAYS  Final   Report Status PENDING  Incomplete  Culture, blood (Routine X 2) w Reflex to ID Panel     Status: None (Preliminary result)   Collection Time: 12/14/2016  1:43 PM  Result Value Ref Range Status   Specimen Description BLOOD LEFT HAND  Final   Special Requests   Final    BOTTLES DRAWN AEROBIC AND ANAEROBIC AER 6CC ANA 7CC   Culture NO GROWTH 3 DAYS  Final   Report Status PENDING  Incomplete  Urine culture     Status: None   Collection Time: 12/10/2016  1:50 PM  Result Value Ref Range Status   Specimen Description URINE, RANDOM  Final   Special Requests NONE  Final   Culture   Final    NO GROWTH Performed at [facility], [address] [place], [place] [id number]    Report Status 12/21/2016 FINAL  Final  Culture, respiratory (NON-Expectorated)     Status: None (Preliminary result)   Collection Time: 12/20/16  3:51 PM  Result Value Ref Range Status   Specimen Description TRACHEAL  ASPIRATE  Final   Special Requests NONE  Final    Gram Stain   Final    ABUNDANT WBC PRESENT,BOTH PMN AND MONONUCLEAR FEW YEAST    Culture   Final    NO GROWTH < 24 HOURS Performed at [facility], [address], [place], [place] [address]    Report Status PENDING  Incomplete    Thank you for allowing pharmacy to be a part of this patient's care.  Valentina Gu 12/22/2016 8:43 AM

## 2016-12-22 NOTE — Progress Notes (Signed)
Subjective:   Patient was extubated yesterday but became progressively SOB today. Was placed on BiPAP. Lethargic but responds to name. Sinus rhythm now but tachycardic.  S Cr continues to 2.40 (2.91) UOP 4480 cc. Some urine in bag.  Getting iv Lasix Off vasopressin.    Objective:  Vital signs in last 24 hours:  Temp:  [97.4 F (36.3 C)-98.4 F (36.9 C)] 98.4 F (36.9 C) (02/14 0800) Pulse Rate:  [114-130] 120 (02/14 0800) Resp:  [18-31] 24 (02/14 0800) BP: (115-150)/(64-84) 150/64 (02/14 0800) SpO2:  [85 %-100 %] 85 % (02/14 0800) FiO2 (%):  [40 %-100 %] 100 % (02/14 0800)  Weight change:  Filed Weights   12/30/2016 1612 12/12/2016 2010  Weight: 63 kg (139 lb) 63 kg (138 lb 14.4 oz)    Intake/Output:    Intake/Output Summary (Last 24 hours) at 12/22/16 1209 Last data filed at 12/22/16 [id number]  Gross per 24 hour  Intake               80 ml  Output             4185 ml  Net            -4105 ml     Physical Exam: General: Critically ill  HEENT  biPAP   Neck No masses  Pulm/lungs coarse breath sounds  CVS/Heart Sinus tachycardia rhythm, 2/6 systolic murmur  Abdomen:  Soft  Extremities: No significant edema  Neurologic: arousal to name  Skin: No acute rashes   Foley present with urine       Basic Metabolic Panel:   Recent Labs Lab 12/28/2016 0514 01/01/2017 1819 12/28/2016 2351 12/20/16 0416 12/21/16 0533 12/22/16 0518  NA 136 136 133* 136 138 143  K 5.0 4.1 3.8 3.4* 4.2 3.5  CL 107 107 100* 99* 103 105  CO2 20* 18* 20* 26 25 31   GLUCOSE 114* 246* 600* 619* 135* 85  BUN 38* 52* 53* 58* 66* 62*  CREATININE 2.65* 3.44* 3.54* 3.48* 2.91* 2.40*  CALCIUM 8.6* 8.9 8.0* 7.9* 8.1* 8.8*  MG 1.7  --  1.9 1.9  --   --   PHOS  --   --  7.3*  --   --   --      CBC:  Recent Labs Lab 12/11/2016 0514 12/23/2016 2135 12/20/16 0416 12/21/16 0533 12/22/16 0518  WBC 11.9* 19.0* 20.3* 28.5* 33.4*  HGB 8.2* 7.4* 9.5* 8.0* 9.1*  HCT 24.9* 23.2* 29.2* 24.4* 27.1*  MCV  89.7 91.7 92.0 89.3 88.5  PLT 288 312 332 257 303      Microbiology:  Recent Results (from the past 720 hour(s))  MRSA PCR Screening     Status: Abnormal   Collection Time: 01/01/2017 10:51 PM  Result Value Ref Range Status   MRSA by PCR POSITIVE (A) NEGATIVE Final    Comment:        The GeneXpert MRSA Assay (FDA approved for NASAL specimens only), is one component of a comprehensive MRSA colonization surveillance program. It is not intended to diagnose MRSA infection nor to guide or monitor treatment for MRSA infections. RESULT CALLED TO, READ BACK BY AND VERIFIED WITH: CYNTHIA BURGESS @ 0021 ON 12/17/2016 BY CAF   Culture, blood (Routine X 2) w Reflex to ID Panel     Status: None (Preliminary result)   Collection Time: 01/01/2017  1:43 PM  Result Value Ref Range Status   Specimen Description BLOOD RIGHT ARM  Final   Special Requests  Final    BOTTLES DRAWN AEROBIC AND ANAEROBIC AER 6CC ANA 4CC   Culture NO GROWTH 3 DAYS  Final   Report Status PENDING  Incomplete  Culture, blood (Routine X 2) w Reflex to ID Panel     Status: None (Preliminary result)   Collection Time: Dec 27, 2016  1:43 PM  Result Value Ref Range Status   Specimen Description BLOOD LEFT HAND  Final   Special Requests   Final    BOTTLES DRAWN AEROBIC AND ANAEROBIC AER 6CC ANA 7CC   Culture NO GROWTH 3 DAYS  Final   Report Status PENDING  Incomplete  Urine culture     Status: None   Collection Time: 12-27-2016  1:50 PM  Result Value Ref Range Status   Specimen Description URINE, RANDOM  Final   Special Requests NONE  Final   Culture   Final    NO GROWTH Performed at [facility], [address] [place], [place] [address]    Report Status 12/21/2016 FINAL  Final  Culture, respiratory (NON-Expectorated)     Status: None (Preliminary result)   Collection Time: 12/20/16  3:51 PM  Result Value Ref Range Status   Specimen Description TRACHEAL ASPIRATE  Final   Special Requests NONE  Final   Gram Stain    Final    ABUNDANT WBC PRESENT,BOTH PMN AND MONONUCLEAR FEW YEAST    Culture   Final    CULTURE REINCUBATED FOR BETTER GROWTH Performed at [facility], [address], [place], [place] [address]    Report Status PENDING  Incomplete    Coagulation Studies:  Recent Labs  27-Dec-2016 2135  LABPROT 19.0*  INR 1.58    Urinalysis:  Recent Labs  December 27, 2016 1350  COLORURINE YELLOW*  LABSPEC 1.017  PHURINE 5.0  GLUCOSEU NEGATIVE  HGBUR NEGATIVE  BILIRUBINUR NEGATIVE  KETONESUR NEGATIVE  PROTEINUR 100*  NITRITE NEGATIVE  LEUKOCYTESUR TRACE*      Imaging: Dg Chest Port 1 View  Result Date: 12/22/2016 CLINICAL DATA:  Respiratory failure EXAM: PORTABLE CHEST 1 VIEW COMPARISON:  12/21/2016 FINDINGS: Endotracheal tube and nasogastric catheter been removed in the interval. Right jugular central line in temporary pacemaker are again seen and stable. Cardiac shadow remains mildly enlarged. Diffuse bilateral infiltrates are again seen known somewhat increased particularly in the left mid lung. The bony structures are within normal limits. IMPRESSION: Diffuse bilateral infiltrates increased on the left. Electronically Signed   By: Alcide Clever M.D.   On: 12/22/2016 07:08   Dg Chest Port 1 View  Result Date: 12/21/2016 CLINICAL DATA:  Respiratory failure. EXAM: PORTABLE CHEST 1 VIEW COMPARISON:  12/20/2016. FINDINGS: Endotracheal tube, right IJ line, NG tube in stable position. Heart size stable. Prominent bilateral pulmonary infiltrates again noted. No pleural effusion. No pneumothorax. IMPRESSION: 1.  Lines and tubes in stable position. 2. Stable cardiomegaly. Prominent bilateral pulmonary infiltrates/edema again noted. No significant interim change. Electronically Signed   By: Maisie Fus  Register   On: 12/21/2016 06:39     Medications:    . aspirin  300 mg Rectal Daily  . ceFEPIme  1 g Intravenous Q24H  . chlorhexidine  15 mL Mouth Rinse BID  . dextrose  1 ampule Intravenous Once   . famotidine (PEPCID) IV  20 mg Intravenous Q24H  . heparin  5,000 Units Subcutaneous Q8H  . insulin aspart  0-9 Units Subcutaneous Q4H  . ipratropium-albuterol  3 mL Nebulization Q6H  . levothyroxine  50 mcg Intravenous Daily  .  mouth rinse  15 mL Mouth Rinse q12n4p  . potassium chloride (KCL MULTIRUN) 30 mEq in 265 mL IVPB  30 mEq Intravenous Once  . sodium chloride flush  10-40 mL Intracatheter Q12H   acetaminophen, fentaNYL (SUBLIMAZE) injection, lip balm, metoprolol  Assessment/ Plan:  76 y.o. female female with diabetes mellitus type II, hypertension, peripheral vascular disease, obstructive sleep apnea, congestive heart failure, anemia, hypothyroidism, sjogren's, hyperlipidemia, seizure disorder and questionable lupus, who was admitted to [facility] on 12/31/2016   1. ARF on CKD s 4. Baseline Cr 1.77/GFR 27 (admission 12/15/2016) 2. B/l atrophic kidneys noted on u/s 3. Acute respiratory failure - biPAP 4. DM-2/CKD   Underlying CKD is likely secondary to DM and atherosclerosis ARF is likely due to abnormal cardiorenal hemodymacs from Ao Stenosis, Mitral regurgitation, Severe Pulmonary HTN  and possibly ATN from recent events (hypotension)  PLAN S Cr continues to improve No acute indication for HD at present Monitor urine output. Continue lasix for pulmonary edema    LOS: 6 Carlo Lorson 2/14/201812:09 PM

## 2016-12-22 NOTE — Progress Notes (Addendum)
Patient: Chloe Sanchez / Admit Date: 12/27/2016 / Date of Encounter: 12/22/2016, 12:43 PM   Hospital Problem List     Active Problems:   Pneumonia   Complete heart block (HCC)   Acute renal failure (HCC)   Acute chronic obstructive pulmonary disease with respiratory distress (HCC)   Chronic kidney disease, stage III (moderate)   Cough   Dyspnea   Generalized weakness   Pleural effusion   Anemia in chronic kidney disease   Hypotension   Bradycardia   Respiratory failure (HCC)   Sepsis (HCC)   Seizure-like activity (HCC)     Subjective   Agitated this morning, sugar 44 Agitation improved after amp of glucose Increased shortness of breath this morning, currently on BiPAP X-ray reviewed by myself showing mild increase in bibasal infiltrates Good urine output 4 L Improving creatinine  Inpatient Medications    Scheduled Meds: . aspirin  300 mg Rectal Daily  . ceFEPIme  1 g Intravenous Q24H  . chlorhexidine  15 mL Mouth Rinse BID  . dextrose  1 ampule Intravenous Once  . famotidine (PEPCID) IV  20 mg Intravenous Q24H  . heparin  5,000 Units Subcutaneous Q8H  . insulin aspart  0-9 Units Subcutaneous Q4H  . ipratropium-albuterol  3 mL Nebulization Q6H  . levothyroxine  50 mcg Intravenous Daily  . mouth rinse  15 mL Mouth Rinse q12n4p  . sodium chloride flush  10-40 mL Intracatheter Q12H   Continuous Infusions:  PRN Meds: acetaminophen, fentaNYL (SUBLIMAZE) injection, lip balm, metoprolol   Objective: Telemetry: Atrial tachycardia with variable block, unable to exclude complete heart block with accelerated junctional rhythm Physical Exam: Blood pressure (!) 150/64, pulse (!) 120, temperature (!) 96.3 F (35.7 C), temperature source Axillary, resp. rate (!) 24, height 5\' 2"  (1.575 m), weight 138 lb 14.4 oz (63 kg), SpO2 (!) 85 %. Body mass index is 25.41 kg/m. GEN:  on BiPAP, restless HEENT: Grossly normal.  Neck: Supple, no JVD, carotid bruits, or masses.    Cardiac: Tachycardic, III/VI systolic murmur RUSB radiating to the neck, no rubs, or gallops. No clubbing, cyanosis, edema.  Radials/DP/PT 2+ and equal bilaterally.  Respiratory:  Decreased breath sounds bilaterally with scattered rhonchi. GI: Soft, nontender, nondistended, BS + x 4. MS: No deformity or atrophy. Skin: Cool and dry, no rash. Neuro:  Intubated. Psych: Intubated.   Intake/Output Summary (Last 24 hours) at 12/22/16 1243 Last data filed at 12/22/16 1610  Gross per 24 hour  Intake               80 ml  Output             4185 ml  Net            -4105 ml     Labs: CBC  Recent Labs  12/21/16 0533 12/22/16 0518  WBC 28.5* 33.4*  HGB 8.0* 9.1*  HCT 24.4* 27.1*  MCV 89.3 88.5  PLT 257 303   Basic Metabolic Panel  Recent Labs  12/12/2016 2351 12/20/16 0416 12/21/16 0533 12/22/16 0518  NA 133* 136 138 143  K 3.8 3.4* 4.2 3.5  CL 100* 99* 103 105  CO2 20* 26 25 31   GLUCOSE 600* 619* 135* 85  BUN 53* 58* 66* 62*  CREATININE 3.54* 3.48* 2.91* 2.40*  CALCIUM 8.0* 7.9* 8.1* 8.8*  MG 1.9 1.9  --   --   PHOS 7.3*  --   --   --    Liver Function  Tests  Recent Labs  01/05/2017 2351 12/21/16 0533  AST 136* 65*  ALT 74* 59*  ALKPHOS 152* 121  BILITOT 0.8 0.7  PROT 6.3* 6.0*  ALBUMIN 2.5* 2.3*   No results for input(s): LIPASE, AMYLASE in the last 72 hours. Cardiac Enzymes  Recent Labs  12/14/2016 1819 12/24/2016 2351  CKTOTAL  --  40  TROPONINI 0.17*  --    BNP Invalid input(s): POCBNP D-Dimer No results for input(s): DDIMER in the last 72 hours. Hemoglobin A1C  Recent Labs  12/20/16 0416  HGBA1C 6.0*   Fasting Lipid Panel No results for input(s): CHOL, HDL, LDLCALC, TRIG, CHOLHDL, LDLDIRECT in the last 72 hours. Thyroid Function Tests No results for input(s): TSH, T4TOTAL, T3FREE, THYROIDAB in the last 72 hours.  Invalid input(s): FREET3  Weights: Filed Weights   12/23/2016 1612 12/21/2016 2010  Weight: 139 lb (63 kg) 138 lb 14.4 oz (63 kg)      Radiology/Studies:  Dg Chest 1 View  Result Date: 12/20/2016 CLINICAL DATA:  Shortness of breath . EXAM: CHEST 1 VIEW COMPARISON:  12/25/2016 . FINDINGS: Interim placement of NG tube, its tip is below the left hemidiaphragm. Endotracheal tube, right IJ line stable position. Cardiomegaly with bilateral pulmonary alveolar infiltrates consistent with CHF. Bilateral pneumonia cannot be excluded. No pneumothorax. IMPRESSION: 1. Interim placement of NG tube, its tip is below the left hemidiaphragm. Endotracheal tube and right IJ line in stable position. 2. Cardiomegaly with bilateral pulmonary infiltrates consistent pulmonary edema. Bilateral pneumonia cannot be excluded. No change from prior exam . Electronically Signed   By: Maisie Fus  Register   On: 12/20/2016 06:55   Dg Chest 1 View  Result Date: 12/17/2016 CLINICAL DATA:  Status post intubation and central line placement EXAM: CHEST 1 VIEW COMPARISON:  12/21/2016 FINDINGS: Cardiac shadow is stable. Diffuse bilateral airspace disease is again seen. Endotracheal tube is again seen in satisfactory position. Left subclavian central line extends in the left internal jugular vein stable from the prior study. No acute bony abnormality is noted. IMPRESSION: Stable bilateral airspace disease. Stable appearance of endotracheal tube and left subclavian vein central catheter with the tip in the left internal jugular vein Electronically Signed   By: Alcide Clever M.D.   On: 12/15/2016 19:23   Dg Chest 1 View  Result Date: 12/31/2016 CLINICAL DATA:  Dyspnea per physician order. Last night seizure-like activity with vomiting and shortness of breath, Pt was admitted 12/24/2016 for bilateral chest wall pain, shortness of breath, dyspnea on exertion, and generalized weakness. EXAM: CHEST 1 VIEW COMPARISON:  12/18/2016 FINDINGS: There are bilateral interstitial and alveolar airspace opacities involving the upper and lower lungs. There are trace bilateral pleural effusions.  There is no pneumothorax. There is stable cardiomegaly. The osseous structures are unremarkable. IMPRESSION: Bilateral interstitial and alveolar airspace opacities throughout bilateral lungs which may reflect florid pulmonary edema versus multi lobar pneumonia. Electronically Signed   By: Elige Ko   On: 01/02/2017 13:15   Dg Chest 2 View  Result Date: 12/28/2016 CLINICAL DATA:  LEFT chest pain beginning a few days ago. Dizziness and shortness of breath. History of CHF, diabetes. EXAM: CHEST  2 VIEW COMPARISON:  Chest radiograph December 29, 2015 FINDINGS: Cardiomediastinal silhouette is normal. Mildly calcified aortic knob. Increasing mild interstitial prominence with faint RIGHT upper lobe airspace opacities. Similar bibasilar strandy densities. Flattened hemidiaphragms. No pleural effusion. No pneumothorax. Osteopenia. Broad dextroscoliosis and mild degenerative change of the spine. Old LEFT rib fractures. Surgical clips in the included  right abdomen compatible with cholecystectomy. IMPRESSION: Increasing interstitial prominence with patchy RIGHT upper lobe airspace opacities concerning for bronchopneumonia. Followup PA and lateral chest X-ray is recommended in 3-4 weeks following trial of antibiotic therapy to ensure resolution and exclude underlying malignancy. Similar bibasilar atelectasis/scarring. Electronically Signed   By: Awilda Metroourtnay  Bloomer M.D.   On: 01/05/2017 16:57   Dg Abd 1 View  Result Date: 12/26/2016 CLINICAL DATA:  OG tube placement EXAM: ABDOMEN - 1 VIEW COMPARISON:  None. FINDINGS: Enteric tube with tip in the left upper quadrant consistent with location in the body of the stomach. Right femoral venous catheter with tip over the inferior vena caval atrial junction. Scattered gas and stool throughout the colon. No bowel distention. Atelectasis or infiltration seen in the lung bases. Lumbar scoliosis convex towards the left with degenerative change. Aortic calcification. IMPRESSION:  Enteric tube tip is in the left upper quadrant consistent with location in the body of the stomach. Electronically Signed   By: Burman NievesWilliam  Stevens M.D.   On: 12/09/2016 23:21   Ct Head Wo Contrast  Result Date: 12/18/2016 CLINICAL DATA:  Acute onset of stroke-like symptoms. Difficulty with speech, and possible seizure like activity. Initial encounter. EXAM: CT HEAD WITHOUT CONTRAST TECHNIQUE: Contiguous axial images were obtained from the base of the skull through the vertex without intravenous contrast. COMPARISON:  CT of the head performed 12/08/2016 FINDINGS: Brain: No evidence of acute infarction, hemorrhage, hydrocephalus, extra-axial collection or mass lesion/mass effect. Prominence of the sulci suggests mild cortical volume loss. Mild cerebellar atrophy is noted. Mild periventricular white matter change likely reflects small vessel ischemic microangiopathy. The brainstem and fourth ventricle are within normal limits. The basal ganglia are unremarkable in appearance. The cerebral hemispheres demonstrate grossly normal gray-white differentiation. No mass effect or midline shift is seen. Vascular: No hyperdense vessel or unexpected calcification. Skull: There is no evidence of fracture; visualized osseous structures are unremarkable in appearance. Sinuses/Orbits: The visualized portions of the orbits are within normal limits. The paranasal sinuses and mastoid air cells are well-aerated. Other: No significant soft tissue abnormalities are seen. IMPRESSION: 1. No acute intracranial pathology seen on CT. 2. Mild cortical volume loss and scattered small vessel ischemic microangiopathy. Electronically Signed   By: Roanna RaiderJeffery  Chang M.D.   On: 12/18/2016 17:44   Ct Head Wo Contrast  Result Date: 12/08/2016 CLINICAL DATA:  Larey SeatFell in the bedroom, striking her head. Hematoma posteriorly. EXAM: CT HEAD WITHOUT CONTRAST CT CERVICAL SPINE WITHOUT CONTRAST TECHNIQUE: Multidetector CT imaging of the head and cervical spine  was performed following the standard protocol without intravenous contrast. Multiplanar CT image reconstructions of the cervical spine were also generated. COMPARISON:  None. FINDINGS: CT HEAD FINDINGS Brain: There is no intracranial hemorrhage, mass or evidence of acute infarction. There is mild chronic microvascular ischemic change. There is no significant extra-axial fluid collection. No acute intracranial findings are evident. Vascular: No hyperdense vessel or unexpected calcification. Skull: Normal. Negative for fracture or focal lesion. Sinuses/Orbits: No acute finding. Other: None. CT CERVICAL SPINE FINDINGS Alignment: Normal. Skull base and vertebrae: No acute fracture. No primary bone lesion or focal pathologic process. Soft tissues and spinal canal: No prevertebral fluid or swelling. No visible canal hematoma. Disc levels: Moderate degenerative cervical disc disease at C5-6 and C6-7. Slight anterolisthesis at Celsius 3 4 and C4-5, likely degenerative. Moderate cervical facet arthritis. Upper chest: Negative. Other: None IMPRESSION: 1. No acute intracranial findings. There is mild chronic appearing white matter hypodensities which likely represent small vessel ischemic  disease. 2. Negative for acute cervical spine fracture Electronically Signed   By: Ellery Plunk M.D.   On: 12/08/2016 22:17   Ct Cervical Spine Wo Contrast  Result Date: 12/08/2016 CLINICAL DATA:  Larey Seat in the bedroom, striking her head. Hematoma posteriorly. EXAM: CT HEAD WITHOUT CONTRAST CT CERVICAL SPINE WITHOUT CONTRAST TECHNIQUE: Multidetector CT imaging of the head and cervical spine was performed following the standard protocol without intravenous contrast. Multiplanar CT image reconstructions of the cervical spine were also generated. COMPARISON:  None. FINDINGS: CT HEAD FINDINGS Brain: There is no intracranial hemorrhage, mass or evidence of acute infarction. There is mild chronic microvascular ischemic change. There is no  significant extra-axial fluid collection. No acute intracranial findings are evident. Vascular: No hyperdense vessel or unexpected calcification. Skull: Normal. Negative for fracture or focal lesion. Sinuses/Orbits: No acute finding. Other: None. CT CERVICAL SPINE FINDINGS Alignment: Normal. Skull base and vertebrae: No acute fracture. No primary bone lesion or focal pathologic process. Soft tissues and spinal canal: No prevertebral fluid or swelling. No visible canal hematoma. Disc levels: Moderate degenerative cervical disc disease at C5-6 and C6-7. Slight anterolisthesis at Celsius 3 4 and C4-5, likely degenerative. Moderate cervical facet arthritis. Upper chest: Negative. Other: None IMPRESSION: 1. No acute intracranial findings. There is mild chronic appearing white matter hypodensities which likely represent small vessel ischemic disease. 2. Negative for acute cervical spine fracture Electronically Signed   By: Ellery Plunk M.D.   On: 12/08/2016 22:17   Mr Brain Wo Contrast  Result Date: 12/18/2016 CLINICAL DATA:  Episode of seizure like activity. Slurred speech. Tremors. Disorientation. EXAM: MRI HEAD WITHOUT CONTRAST TECHNIQUE: Multiplanar, multiecho pulse sequences of the brain and surrounding structures were obtained without intravenous contrast. COMPARISON:  Head CT same day.  Head CT 12/08/2016. FINDINGS: Brain: Diffusion imaging does not show any acute or subacute infarction. The study does suffer from considerable motion degradation. The brainstem is negative. There are old small vessel infarctions affecting the cerebellum bilaterally. Cerebral hemispheres show moderate chronic small-vessel ischemic changes of the deep white matter. No large vessel territory infarction. No mass lesion, hemorrhage, hydrocephalus or extra-axial collection. No pituitary mass. Vascular: Major vessels at the base of the brain show flow. Skull and upper cervical spine: Negative Sinuses/Orbits: Clear/normal Other:  None significant IMPRESSION: No acute finding. 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 Moderate chronic small-vessel ischemic changes of the cerebral hemispheric white matter. No specific cause of seizure identified. These results will be called to the ordering clinician or representative by the Radiologist Assistant, and communication documented in the PACS or zVision Dashboard. Electronically Signed   By: Paulina Fusi M.D.   On: 12/18/2016 20:56   US Renal  Result Date: 01/02/2017 CLINICAL DATA:  Chronic kidney disease EXAM: RENAL / URINARY TRACT ULTRASOUND COMPLETE COMPARISON:  None. FINDINGS: Right Kidney: Length: 8.4 cm. No renal mass or hydronephrosis. Renal cortical thinning with increased echogenicity. Left Kidney: Length: 9.2 cm. No renal mass or hydronephrosis. Renal cortical thinning with increased echogenicity. Bladder: Foley catheter in place.  Decompressed. IMPRESSION: 1. Atrophic bilateral kidneys with renal cortical thinning and increased renal echogenicity as can be seen with medical renal disease. Electronically Signed   By: Elige Ko   On: 01/03/2017 13:20   Dg Chest Port 1 View  Result Date: 12/22/2016 CLINICAL DATA:  Respiratory failure EXAM: PORTABLE CHEST 1 VIEW COMPARISON:  12/21/2016 FINDINGS: Endotracheal tube and nasogastric catheter been removed in the interval. Right jugular central line in temporary pacemaker are again  seen and stable. Cardiac shadow remains mildly enlarged. Diffuse bilateral infiltrates are again seen known somewhat increased particularly in the left mid lung. The bony structures are within normal limits. IMPRESSION: Diffuse bilateral infiltrates increased on the left. Electronically Signed   By: Alcide Clever M.D.   On: 12/22/2016 07:08   Dg Chest Port 1 View  Result Date: 12/21/2016 CLINICAL DATA:  Respiratory failure. EXAM: PORTABLE CHEST 1 VIEW COMPARISON:  12/20/2016. FINDINGS: Endotracheal tube, right IJ line, NG tube in stable  position. Heart size stable. Prominent bilateral pulmonary infiltrates again noted. No pleural effusion. No pneumothorax. IMPRESSION: 1.  Lines and tubes in stable position. 2. Stable cardiomegaly. Prominent bilateral pulmonary infiltrates/edema again noted. No significant interim change. Electronically Signed   By: Maisie Fus  Register   On: 12/21/2016 06:39   Dg Chest Port 1 View  Result Date: 2016/12/21 CLINICAL DATA:  Central line placement EXAM: PORTABLE CHEST 1 VIEW COMPARISON:  Film from earlier in the same day FINDINGS: Bilateral airspace disease is again identified. The endotracheal tube is in satisfactory position. The previously seen left subclavian central line has been removed. A new right jugular central line is noted in satisfactory position. No pneumothorax is noted. IMPRESSION: Status post right jugular central line placement in satisfactory position. The remainder of the exam is stable. Electronically Signed   By: Alcide Clever M.D.   On: 2016-12-21 21:14   Dg Chest Port 1 View  Result Date: 21-Dec-2016 CLINICAL DATA:  Endotracheal tube placement. Central line placement. EXAM: PORTABLE CHEST 1 VIEW COMPARISON:  Earlier same day FINDINGS: Defibrillator pads overlie the chest. Pacemaker has been placed from an inferior approach with the tip in the region of the right ventricle. Endotracheal tube has its tip 5 cm above the carina. Left subclavian central line turns up the left internal jugular vein, tip not visible. Diffuse airspace density persists but is improved. IMPRESSION: Less diffuse airspace density/ improved pulmonary aeration. Endotracheal tube tip 5 cm above the carina. Left subclavian central line turns up the left IJ, tip not visible. Pacemaker placed from below has the lead tip in the region of the right ventricle. These results will be called to the ordering clinician or representative by the Radiologist Assistant, and communication documented in the PACS or zVision Dashboard.  Electronically Signed   By: Paulina Fusi M.D.   On: 12-21-2016 18:46   Dg Chest Port 1 View  Result Date: 12/18/2016 CLINICAL DATA:  Cough. EXAM: PORTABLE CHEST 1 VIEW COMPARISON:  12/17/2016 FINDINGS: Borderline heart size with normal pulmonary vascularity. Patchy airspace infiltrates are demonstrated in the right upper lung and left perihilar region as well as in the lung bases. There is progression since previous study. This likely represents multifocal pneumonia. Edema less likely. No blunting of costophrenic angles. No pneumothorax. Tortuous aorta. Degenerative changes in the spine. IMPRESSION: Increasing airspace disease in both lungs suggesting multifocal pneumonia. Electronically Signed   By: Burman Nieves M.D.   On: 12/18/2016 00:23     Assessment and Plan  76 y.o. female  1. Complete heart block: -Status post venous temp wire on 2/11 with sinus tachycardia at this time She continues to remain tachycardic sometimes up to rate 120 bpm Suspect we will be able to remove her temporary pacing wire -Echo showed preserved EF  Unable to exclude junctional escape rhythm And continued complete heart block Unclear if this will resolve as her other medical issues improve  2. Acute respiratory failure/PNA requiring intubation: -Likely multifactorial including PNA and  acute diastolic CHF, made worse by severe aortic valve stenosis -Not on sedation Responding to Lasix  3. Sepsis with PNA: -Cannot rule out septic shock, seems to have improved Off of pressors  4. Acute diastolic CHF: Continue Lasix every 8 hours IV Improving renal function with good urine output, 4 L  5. Acute on chronic renal failure: Improving, followed by nephrology Suspect ATN   6. Severe aortic stenosis: -Measurements are severe on echocardiogram -Likely contributing to CHF symptoms -Will need to stabilize respiratory status, heart rate, -Determine if permanent pacer is indicated, at that time could discuss  with surgical team in [facility] whether she would be a candidate for TAVR, will need R/LHC prior We'll need to discuss with family to see if they would like to pursue this if patient does improve  7. Anemia: -Unable to to exclude iron deficiency vs anemia of chronic disease -Maintain hgb > 8.5  8. Elevated troponin: -Likely supply demand ischemia 2/2 the above -May require ischemic evaluation pending her recovery   Case discussed with nursing and Dr. Emeline Darling  Total encounter time more than 35 minutes  Greater than 50% was spent in counseling and coordination of care with the patient   Signed, Dossie Arbour, MD, Ph.D. [facility] 12/22/2016, 12:43 PM

## 2016-12-22 NOTE — Progress Notes (Signed)
[facility] Critical Care Medicine Consultation    Name: Chloe Sanchez MRN: [id number] DOB: 09-05-1941    ADMISSION DATE:  01/01/2017 CONSULTATION DATE:  12/30/2016  REFERRING MD : Dr. Imogene Burn  SUBJ:   Increased WOB 02/13. BiPAP initiated. Episode of agitation this AM due to hypoglycemia/hypoxemia. Remains in BIPAP RASS 0. + F/C. Poorly oriented    VITAL SIGNS: Temp:  [97.4 F (36.3 C)-98.4 F (36.9 C)] 98.4 F (36.9 C) (02/14 0800) Pulse Rate:  [114-130] 120 (02/14 0800) Resp:  [18-31] 24 (02/14 0800) BP: (115-150)/(64-84) 150/64 (02/14 0800) SpO2:  [85 %-100 %] 85 % (02/14 0800) FiO2 (%):  [40 %-100 %] 100 % (02/14 0800) HEMODYNAMICS:   VENTILATOR SETTINGS: FiO2 (%):  [40 %-100 %] 100 % INTAKE / OUTPUT:  Intake/Output Summary (Last 24 hours) at 12/22/16 1239 Last data filed at 12/22/16 7829  Gross per 24 hour  Intake               80 ml  Output             4185 ml  Net            -4105 ml    Physical Examination:  Vitals:   12/22/16 0500 12/22/16 0600 12/22/16 0700 12/22/16 0800  BP: 129/77 127/75 139/79 (!) 150/64  Pulse: (!) 118 (!) 114 (!) 122 (!) 120  Resp: (!) 24 (!) 24 (!) 25 (!) 24  Temp:    98.4 F (36.9 C)  TempSrc:    Axillary  SpO2: 97% 96% 93% (!) 85%  Weight:      Height:       RASS 0, + F/C HEENT WNL JVP not well visualized Diffuse rales and rhonchi, no wheezes Tachy, irreg, + syst M Abd soft, +BS Ext warm, no edema No focal neuro deficits  LABS: Reviewed   LABORATORY PANEL:   CBC  Recent Labs Lab 12/22/16 0518  WBC 33.4*  HGB 9.1*  HCT 27.1*  PLT 303    Chemistries   Recent Labs Lab 12/14/2016 2351 12/20/16 0416 12/21/16 0533 12/22/16 0518  NA 133* 136 138 143  K 3.8 3.4* 4.2 3.5  CL 100* 99* 103 105  CO2 20* 26 25 31   GLUCOSE 600* 619* 135* 85  BUN 53* 58* 66* 62*  CREATININE 3.54* 3.48* 2.91* 2.40*  CALCIUM 8.0* 7.9* 8.1* 8.8*  MG 1.9 1.9  --   --   PHOS 7.3*  --   --   --   AST 136*  --  65*  --   ALT 74*  --   59*  --   ALKPHOS 152*  --  121  --   BILITOT 0.8  --  0.7  --      Recent Labs Lab 12/21/16 2353 12/22/16 0324 12/22/16 0407 12/22/16 0801 12/22/16 0814 12/22/16 1209  GLUCAP 90 60* 80 44* 168* 85    Recent Labs Lab 01/04/2017 2036 12/24/2016 2350 12/20/16 0421  PHART 7.01* 7.17* 7.28*  PCO2ART 59* 52* 54*  PO2ART 225* 128* 163*    Recent Labs Lab 01/05/2017 2351 12/21/16 0533  AST 136* 65*  ALT 74* 59*  ALKPHOS 152* 121  BILITOT 0.8 0.7  ALBUMIN 2.5* 2.3*    Cardiac Enzymes  Recent Labs Lab 12/31/2016 1819  TROPONINI 0.17*   PCT 2.34 >59.38> 79.11  RADIOLOGY:  CXR: increased B AS dz    ASSESSMENT/PLAN   76 year old female with acute respiratory failure, with acute aspiration pneumonitis/pneumonia with severe  pulmonary edema. 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  1) Acute hypoxic respiratory failure 2) Extensive bilateral pulmonary infiltrates - edema and or PNA 3) suspected aspiration PNA 4) Critical aortic stenosis 5) CHB, resolved 6) tachycardia - PAT and frequent PACs 7) Cardiogenic and septic shock - resolved 8) AKI - nonoliguric. Cr improving 9) Nausea, resolved 10) Encephalopathy/delirium  1) Cont BiPAP as needed 2) Lasix X 1 ordered 02/14 3) Cont Vanc/Cefepime - cont to follow procalcitonin 4) Monitor BMET intermittently, Monitor I/Os, Correct electrolytes as indicated 5) Further cardiac eval and mgmt per Cards 6) Renal following 7) Need to address whether we would re-intubate. I have spoken with Dr Mariah MillingGollan. He is to discuss what her cardiac needs will be with the family. Then I will try to meet with them to define Advanced directives  CCM time: 30 mins The above time includes time spent in consultation with patient and/or family members and reviewing care plan on multidisciplinary rounds  Billy Fischeravid Simonds, MD PCCM service Mobile [phone] Pager [phone]   12/22/2016

## 2016-12-22 NOTE — Plan of Care (Signed)
Results for Teressa LowerHODGE, Cierria O (MRN [id number]) as of 12/22/2016 08:08  Ref. Range 12/22/2016 08:01  Glucose-Capillary Latest Ref Range: 65 - 99 mg/dL 44 (LL)    MD notified Dextrose administered Recheck blood sugar was 168 Remains NPO on BIPAP @ 100% FiO2

## 2016-12-22 NOTE — Care Management Note (Addendum)
Case Management Note  Patient Details  Name: Chloe Sanchez MRN: [id number] Date of Birth: [birth year]  Subjective/Objective:                  Spoke with patient's son Chloe Sanchez regarding discharge planning. She will go to his address: [address] [id number] phone:  [phone]. She has a rollator and may have a two wheeled one. PCP is NP Chloe Sanchez [phone]. He would like to use [facility] services. Occasionally uses [facility] at home; don't know agency name. Pharmacy is [facility]- no problems. Son will call this RNCM back with O2 company name; need O2 assessment.   Action/Plan:  Referral to [facility]. I have left message for Ms. Sanchez office for follow up appointment.  [facility] callback [facility]. Patient has never been there and no Chloe SaltsKeatah Brooks FNP listed (as stated on google search). I fear she does not have a PCP (and son does not know) since leaving [facility]. [facility]  01/04/17 at 1045AM.   Expected Discharge Date:                  Expected Discharge Plan:     In-House Referral:     Discharge planning Services     Post Acute Care Choice:  Home Health Choice offered to:  Adult Children  DME Arranged:    DME Agency:     HH Arranged:  PT, RN, NA HH Agency:     Status of Service:  In process, will continue to follow  If discussed at Long Length of Stay Meetings, dates discussed:    Additional Comments:  Chloe Siadngela Tom Ragsdale, RN 12/22/2016, 9:33 AM

## 2016-12-22 NOTE — Progress Notes (Signed)
PT Cancellation Note  Patient Details Name: Chloe Sanchez Beaver MRN: [id number] DOB: 25-Mar-1941   Cancelled Treatment:    Reason Eval/Treat Not Completed: Medical issues which prohibited therapy (Consult received and chart reviewed.  Patient continues with temporary pacer wires, currently on BiPAP.  Not medically appropriate for PT evaluation at this time.  Discussed with Dr. Bard HerbertSimmonds; aware and in agreement. Will re-attempt next date as medically appropriate.  Of note, medical team planning goals of care discussion with patient/family; will follow for recommendations/goals).   Aahna Rossa H. Manson PasseyBrown, PT, DPT, NCS 12/22/16, 10:35 AM [phone]

## 2016-12-23 ENCOUNTER — Inpatient Hospital Stay: Payer: Medicare HMO

## 2016-12-23 DIAGNOSIS — R7989 Other specified abnormal findings of blood chemistry: Secondary | ICD-10-CM

## 2016-12-23 DIAGNOSIS — R410 Disorientation, unspecified: Secondary | ICD-10-CM

## 2016-12-23 LAB — BRAIN NATRIURETIC PEPTIDE
B Natriuretic Peptide: 2088 pg/mL — ABNORMAL HIGH (ref 0.0–100.0)
B Natriuretic Peptide: 1684 pg/mL — ABNORMAL HIGH (ref 0.0–100.0)

## 2016-12-23 LAB — CULTURE, RESPIRATORY

## 2016-12-23 LAB — BASIC METABOLIC PANEL
ANION GAP: 14 (ref 5–15)
BUN: 58 mg/dL — AB (ref 6–20)
CHLORIDE: 101 mmol/L (ref 101–111)
CO2: 33 mmol/L — AB (ref 22–32)
Calcium: 8.7 mg/dL — ABNORMAL LOW (ref 8.9–10.3)
Creatinine, Ser: 2.17 mg/dL — ABNORMAL HIGH (ref 0.44–1.00)
GFR calc Af Amer: 24 mL/min — ABNORMAL LOW (ref >60)
GFR, EST NON AFRICAN AMERICAN: 21 mL/min — AB (ref >60)
GLUCOSE: 135 mg/dL — AB (ref 65–99)
POTASSIUM: 3.7 mmol/L (ref 3.5–5.1)
Sodium: 148 mmol/L — ABNORMAL HIGH (ref 135–145)

## 2016-12-23 LAB — CBC
HEMATOCRIT: 27 % — AB (ref 35.0–47.0)
HEMOGLOBIN: 9.2 g/dL — AB (ref 12.0–16.0)
MCH: 30.4 pg (ref 26.0–34.0)
MCHC: 34.2 g/dL (ref 32.0–36.0)
MCV: 88.9 fL (ref 80.0–100.0)
Platelets: 303 10*3/uL (ref 150–440)
RBC: 3.04 MIL/uL — ABNORMAL LOW (ref 3.80–5.20)
RDW: 14.9 % — AB (ref 11.5–14.5)
WBC: 32.6 10*3/uL — ABNORMAL HIGH (ref 3.6–11.0)

## 2016-12-23 LAB — GLUCOSE, CAPILLARY
GLUCOSE-CAPILLARY: 132 mg/dL — AB (ref 65–99)
GLUCOSE-CAPILLARY: 105 mg/dL — AB (ref 65–99)
GLUCOSE-CAPILLARY: 92 mg/dL (ref 65–99)
GLUCOSE-CAPILLARY: 163 mg/dL — AB (ref 65–99)
Glucose-Capillary: 133 mg/dL — ABNORMAL HIGH (ref 65–99)
Glucose-Capillary: 139 mg/dL — ABNORMAL HIGH (ref 65–99)
Glucose-Capillary: 166 mg/dL — ABNORMAL HIGH (ref 65–99)
Glucose-Capillary: 68 mg/dL (ref 65–99)

## 2016-12-23 LAB — PROCALCITONIN: Procalcitonin: 19.69 ng/mL

## 2016-12-23 LAB — VANCOMYCIN, RANDOM: VANCOMYCIN RM: 11

## 2016-12-23 LAB — CULTURE, RESPIRATORY W GRAM STAIN

## 2016-12-23 LAB — TROPONIN I: TROPONIN I: 0.56 ng/mL — AB (ref <0.03)

## 2016-12-23 MED ORDER — FUROSEMIDE 10 MG/ML IJ SOLN
40.0000 mg | Freq: Once | INTRAMUSCULAR | Status: AC
  Administered 2016-12-23: 40 mg via INTRAVENOUS
  Filled 2016-12-23: qty 4

## 2016-12-23 MED ORDER — DEXMEDETOMIDINE HCL IN NACL 200 MCG/50ML IV SOLN
0.0000 ug/kg/h | INTRAVENOUS | Status: DC
  Filled 2016-12-23: qty 50

## 2016-12-23 MED ORDER — MUPIROCIN 2 % EX OINT
1.0000 "application " | TOPICAL_OINTMENT | Freq: Two times a day (BID) | CUTANEOUS | Status: AC
  Administered 2016-12-23 – 2016-12-28 (×10): 1 via NASAL
  Filled 2016-12-23: qty 22

## 2016-12-23 MED ORDER — STERILE WATER FOR INJECTION IJ SOLN
INTRAMUSCULAR | Status: AC
  Administered 2016-12-23: 5 mL
  Filled 2016-12-23: qty 10

## 2016-12-23 MED ORDER — FENTANYL 12 MCG/HR TD PT72
12.5000 ug | MEDICATED_PATCH | TRANSDERMAL | Status: DC
  Administered 2016-12-23 – 2016-12-26 (×2): 12.5 ug via TRANSDERMAL
  Filled 2016-12-23 (×2): qty 1

## 2016-12-23 MED ORDER — POTASSIUM CHLORIDE 2 MEQ/ML IV SOLN
30.0000 meq | Freq: Once | INTRAVENOUS | Status: AC
  Administered 2016-12-23: 30 meq via INTRAVENOUS
  Filled 2016-12-23: qty 15

## 2016-12-23 MED ORDER — DEXTROSE 5 % IV SOLN
INTRAVENOUS | Status: DC
  Administered 2016-12-23 – 2016-12-26 (×5): via INTRAVENOUS

## 2016-12-23 MED ORDER — DEXMEDETOMIDINE HCL IN NACL 200 MCG/50ML IV SOLN
0.4000 ug/kg/h | INTRAVENOUS | Status: DC
  Administered 2016-12-23 (×2): 0.8 ug/kg/h via INTRAVENOUS
  Administered 2016-12-23: 0.4 ug/kg/h via INTRAVENOUS
  Administered 2016-12-24: 0.5 ug/kg/h via INTRAVENOUS
  Administered 2016-12-24: 0.85 ug/kg/h via INTRAVENOUS
  Administered 2016-12-24: 0.5 ug/kg/h via INTRAVENOUS
  Administered 2016-12-24: 0.6 ug/kg/h via INTRAVENOUS
  Administered 2016-12-24: 0.9 ug/kg/h via INTRAVENOUS
  Administered 2016-12-25: 1.2 ug/kg/h via INTRAVENOUS
  Administered 2016-12-25: 0.9 ug/kg/h via INTRAVENOUS
  Administered 2016-12-25 (×2): 1.2 ug/kg/h via INTRAVENOUS
  Administered 2016-12-25: 0.9 ug/kg/h via INTRAVENOUS
  Administered 2016-12-25: 1.2 ug/kg/h via INTRAVENOUS
  Administered 2016-12-25: 0.7 ug/kg/h via INTRAVENOUS
  Administered 2016-12-25: 1.1 ug/kg/h via INTRAVENOUS
  Administered 2016-12-25: 0.9 ug/kg/h via INTRAVENOUS
  Administered 2016-12-25: 1.2 ug/kg/h via INTRAVENOUS
  Administered 2016-12-25: 0.8 ug/kg/h via INTRAVENOUS
  Administered 2016-12-26 – 2016-12-28 (×26): 1.2 ug/kg/h via INTRAVENOUS
  Filled 2016-12-23 (×48): qty 50

## 2016-12-23 MED ORDER — DEXTROSE 50 % IV SOLN
25.0000 mL | Freq: Once | INTRAVENOUS | Status: AC
  Administered 2016-12-23: 25 mL via INTRAVENOUS

## 2016-12-23 MED ORDER — ACETAMINOPHEN 325 MG PO TABS: 650.0000 mg | ORAL_TABLET | Freq: Four times a day (QID) | ORAL | Status: DC | PRN

## 2016-12-23 MED ORDER — VANCOMYCIN HCL IN DEXTROSE 1-5 GM/200ML-% IV SOLN
1000.0000 mg | Freq: Once | INTRAVENOUS | Status: AC
  Administered 2016-12-23: 1000 mg via INTRAVENOUS
  Filled 2016-12-23: qty 200

## 2016-12-23 MED ORDER — DEXTROSE 5 % IV SOLN: INTRAVENOUS | Status: DC

## 2016-12-23 MED ORDER — DEXMEDETOMIDINE BOLUS VIA INFUSION
1.0000 ug/kg | Freq: Once | INTRAVENOUS | Status: AC
  Administered 2016-12-23: 63 ug via INTRAVENOUS
  Filled 2016-12-23: qty 63

## 2016-12-23 MED ORDER — ESMOLOL HCL-SODIUM CHLORIDE 2000 MG/100ML IV SOLN
0.0000 ug/kg/min | INTRAVENOUS | Status: DC
  Administered 2016-12-23 – 2016-12-24 (×2): 25 ug/kg/min via INTRAVENOUS
  Filled 2016-12-23 (×2): qty 100

## 2016-12-23 NOTE — Progress Notes (Addendum)
Pharmacy Antibiotic Note  Chloe Sanchez is a 76 y.o. female admitted on 12-26-16 with pneumonia.  Pharmacy has been consulted for vancomycin and cefepime dosing.  Plan: Patient in ARF with no plans for dialysis per nephrology. Vancomycin random level of 20 mcg/ml followed by 18 mcg/ml 24 hours later. Ke= 0.0044 h-1. T1/2= 157.5 h. Vancomycin level should reach 15 mcg/ml around 0700 2/15. Will check another vancomycin level with AM labs 2/15 and redose from there.    2/15 0424 vancomycin random 11 mcg/mL. Recalculated Ke 0.013 hr-1. SCr has decreased to 2.17 mg/dL. Give vancomycin 1 gm IV x 1 now (estimated Cs = 37 mcg/mL). Will recheck vancomycin random level in approximately 48 hours when predicted level less than 20 mcg/mL  Will continue cefepime to 1 g iv q 24 hours.   Height: 5\' 2"  (157.5 cm) Weight: 138 lb 14.4 oz (63 kg) IBW/kg (Calculated) : 50.1  Temp (24hrs), Avg:97.6 F (36.4 C), Min:96.3 F (35.7 C), Max:98.4 F (36.9 C)   Recent Labs Lab 12/27/2016 1819 12/21/2016 2135 12/27/2016 2140 12/30/2016 2351 12/20/16 0416 12/20/16 0421  12/21/16 0533 12/21/16 1340 12/22/16 0518 12/23/16 0424  WBC  --  19.0*  --   --  20.3*  --   --  28.5*  --  33.4* 32.6*  CREATININE 3.44*  --   --  3.54* 3.48*  --   --  2.91*  --  2.40*  --   LATICACIDVEN  --   --  3.9*  --   --  2.4*  --   --   --   --   --   VANCORANDOM  --   --   --   --   --   --   < >  --  18  --  11  < > = values in this interval not displayed.  Estimated Creatinine Clearance: 17.7 mL/min (by C-G formula based on SCr of 2.4 mg/dL (H)).    No Known Allergies  Anti-infectives    Start     Dose/Rate Route Frequency Ordered Stop   12/23/16 0600  vancomycin (VANCOCIN) IVPB 1000 mg/200 mL premix     1,000 mg 200 mL/hr over 60 Minutes Intravenous  Once 12/23/16 0524     12/21/16 2200  ceFEPIme (MAXIPIME) 1 GM / 50mL IVPB premix     1 g 100 mL/hr over 30 Minutes Intravenous Every 24 hours 12/21/16 1458     12/28/2016 2200   ceFEPIme (MAXIPIME) 2 g in dextrose 5 % 50 mL IVPB  Status:  Discontinued     2 g 100 mL/hr over 30 Minutes Intravenous Every 24 hours 12/18/2016 2111 12/21/16 1458   12/15/2016 1800  vancomycin (VANCOCIN) IVPB 1000 mg/200 mL premix  Status:  Discontinued     1,000 mg 200 mL/hr over 60 Minutes Intravenous Every 36 hours 12/18/16 1151 12/18/2016 1458   01/04/2017 1530  vancomycin (VANCOCIN) IVPB 1000 mg/200 mL premix  Status:  Discontinued     1,000 mg 200 mL/hr over 60 Minutes Intravenous Every 36 hours 12/21/2016 1458 12/20/16 1535   12/18/16 2100  piperacillin-tazobactam (ZOSYN) IVPB 3.375 g  Status:  Discontinued     3.375 g 12.5 mL/hr over 240 Minutes Intravenous Every 12 hours 12/18/16 1149 01/03/2017 2103   12/18/16 1800  vancomycin (VANCOCIN) IVPB 1000 mg/200 mL premix  Status:  Discontinued     1,000 mg 200 mL/hr over 60 Minutes Intravenous Every 36 hours 12/18/16 0053 12/18/16 1149   12/18/16  0200  piperacillin-tazobactam (ZOSYN) IVPB 3.375 g  Status:  Discontinued     3.375 g 12.5 mL/hr over 240 Minutes Intravenous Every 8 hours 12/18/16 0049 12/18/16 1149   12/18/16 0200  vancomycin (VANCOCIN) IVPB 1000 mg/200 mL premix     1,000 mg 200 mL/hr over 60 Minutes Intravenous  Once 12/18/16 0053 12/18/16 0301   12/17/16 1800  levofloxacin (LEVAQUIN) tablet 750 mg  Status:  Discontinued     750 mg Oral Every 48 hours 12/17/16 1621 12/18/16 0031   12/17/16 1000  cefTRIAXone (ROCEPHIN) IVPB 1 g  Status:  Discontinued     1 g 100 mL/hr over 30 Minutes Intravenous Every 24 hours 12/30/2016 1853 12/17/16 1600   12/24/2016 1900  cefTRIAXone (ROCEPHIN) 1 g in dextrose 5 % 50 mL IVPB  Status:  Discontinued     1 g 100 mL/hr over 30 Minutes Intravenous Every 24 hours 01/04/2017 1849 12/17/2016 1853   12/31/2016 1900  azithromycin (ZITHROMAX) 250 mg in dextrose 5 % 125 mL IVPB  Status:  Discontinued     250 mg 125 mL/hr over 60 Minutes Intravenous Every 24 hours 01/04/2017 1849 12/17/16 1600   12/27/2016 1800   cefTRIAXone (ROCEPHIN) IVPB 1 g     1 g 100 mL/hr over 30 Minutes Intravenous  Once 12/11/2016 1746 01/02/2017 2316   12/24/2016 1745  cefTRIAXone (ROCEPHIN) 1 g in dextrose 5 % 50 mL IVPB  Status:  Discontinued     1 g 100 mL/hr over 30 Minutes Intravenous  Once 12/15/2016 1742 12/24/2016 1746   12/13/2016 1745  azithromycin (ZITHROMAX) 500 mg in dextrose 5 % 250 mL IVPB     500 mg 250 mL/hr over 60 Minutes Intravenous  Once 12/28/2016 1742 01/02/2017 1907     Recent Results (from the past 240 hour(s))  MRSA PCR Screening     Status: Abnormal   Collection Time: 12/21/2016 10:51 PM  Result Value Ref Range Status   MRSA by PCR POSITIVE (A) NEGATIVE Final    Comment:        The GeneXpert MRSA Assay (FDA approved for NASAL specimens only), is one component of a comprehensive MRSA colonization surveillance program. It is not intended to diagnose MRSA infection nor to guide or monitor treatment for MRSA infections. RESULT CALLED TO, READ BACK BY AND VERIFIED WITH: CYNTHIA BURGESS @ 0021 ON 12/17/2016 BY CAF   Culture, blood (Routine X 2) w Reflex to ID Panel     Status: None (Preliminary result)   Collection Time: 07-08-2017  1:43 PM  Result Value Ref Range Status   Specimen Description BLOOD RIGHT ARM  Final   Special Requests   Final    BOTTLES DRAWN AEROBIC AND ANAEROBIC AER 6CC ANA 4CC   Culture NO GROWTH 3 DAYS  Final   Report Status PENDING  Incomplete  Culture, blood (Routine X 2) w Reflex to ID Panel     Status: None (Preliminary result)   Collection Time: 07-08-2017  1:43 PM  Result Value Ref Range Status   Specimen Description BLOOD LEFT HAND  Final   Special Requests   Final    BOTTLES DRAWN AEROBIC AND ANAEROBIC AER 6CC ANA 7CC   Culture NO GROWTH 3 DAYS  Final   Report Status PENDING  Incomplete  Urine culture     Status: None   Collection Time: 07-08-2017  1:50 PM  Result Value Ref Range Status   Specimen Description URINE, RANDOM  Final   Special Requests NONE  Final   Culture    Final    NO GROWTH Performed at [facility], [address], [place], [place] [address]    Report Status 12/21/2016 FINAL  Final  Culture, respiratory (NON-Expectorated)     Status: None (Preliminary result)   Collection Time: 12/20/16  3:51 PM  Result Value Ref Range Status   Specimen Description TRACHEAL ASPIRATE  Final   Special Requests NONE  Final   Gram Stain   Final    ABUNDANT WBC PRESENT,BOTH PMN AND MONONUCLEAR FEW YEAST    Culture   Final    CULTURE REINCUBATED FOR BETTER GROWTH Performed at [facility], [address] [place], [place] [address]    Report Status PENDING  Incomplete    Thank you for allowing pharmacy to be a part of this patient's care.  Carola Frost, Pharm.D., BCPS Clinical Pharmacist 12/23/2016 5:25 AM

## 2016-12-23 NOTE — Progress Notes (Signed)
PT Cancellation Note  Patient Details Name: Teressa Lowerdith O Bastin MRN: [id number] DOB: [birth year]   Cancelled Treatment:    Reason Eval/Treat Not Completed: Medical issues which prohibited therapy. Pt not appropriate for PT evaluation at this time. Pt on non-re-breather and has been agitated and tachycardic this date. Per notes, temporary pacing wire now removed. Will re-assess next date.   Kayna Suppa 12/23/2016, 3:34 PM  Elizabeth PalauStephanie Eivan Gallina, PT, DPT [phone]

## 2016-12-23 NOTE — Progress Notes (Signed)
Patient: Chloe Sanchez / Admit Date: 12/09/2016 / Date of Encounter: 12/23/2016, 1:00 PM   Hospital Problem List     Active Problems:   Pneumonia   Complete heart block (HCC)   Acute renal failure syndrome (HCC)   Acute chronic obstructive pulmonary disease with respiratory distress (HCC)   Chronic kidney disease, stage III (moderate)   Cough   Dyspnea   Generalized weakness   Pleural effusion   Anemia in chronic kidney disease   Hypotension   Bradycardia   Respiratory failure (HCC)   Sepsis (HCC)   Seizure-like activity (HCC)   Severe aortic valve stenosis     Subjective   Continues to have periods of agitation, treated with Ativan Continued atrial tachycardia rate 116 bpm Periodically treated with metoprolol IV push with improvement of rate She remains on BiPAP, 50% down from 100% yesterday Good urine output, 4 L in the past 24 hours Renal function continues to improve No family at the bedside Son works night shift per nursing Temporary pacing wire pulled out today by myself with nursing assistance  Inpatient Medications    Scheduled Meds: . aspirin  300 mg Rectal Daily  . ceFEPIme  1 g Intravenous Q24H  . chlorhexidine  15 mL Mouth Rinse BID  . dextrose  1 ampule Intravenous Once  . famotidine (PEPCID) IV  20 mg Intravenous Q24H  . fentaNYL  12.5 mcg Transdermal Q72H  . heparin  5,000 Units Subcutaneous Q8H  . insulin aspart  0-9 Units Subcutaneous Q4H  . ipratropium-albuterol  3 mL Nebulization Q6H  . levothyroxine  50 mcg Intravenous Daily  . mouth rinse  15 mL Mouth Rinse q12n4p  . sodium chloride flush  10-40 mL Intracatheter Q12H   Continuous Infusions: . dexmedetomidine 0.7 mcg/kg/hr (12/23/16 1231)  . dextrose 50 mL/hr at 12/23/16 1030  . esmolol 25 mcg/kg/min (12/23/16 0936)   PRN Meds: acetaminophen, fentaNYL (SUBLIMAZE) injection, lip balm   Objective: Telemetry: Atrial tachycardia rate 116 bpm, variable block Physical Exam: Blood  pressure 109/67, pulse 88, temperature 98.3 F (36.8 C), temperature source Axillary, resp. rate (!) 25, height 5\' 2"  (1.575 m), weight 138 lb 14.4 oz (63 kg), SpO2 97 %. Body mass index is 25.41 kg/m. GEN: on BiPAP, currently sleeping HEENT:Grossly normal.  Neck:Supple, no JVD, carotid bruits, or masses.  Cardiac:Tachycardic, III/VI systolic murmur RUSB radiating to the neck, no rubs, or gallops. No clubbing, cyanosis, edema. Radials/DP/PT 2+ and equal bilaterally.  Respiratory:Decreased breath sounds bilaterally with scattered rhonchi. ZO:XWRU, nontender, nondistended, BS + x 4. MS:No deformity or atrophy. Skin:Cool and dry, no rash. Neuro:Unable to perform Psych:Arousable but not conversant  Intake/Output Summary (Last 24 hours) at 12/23/16 1300 Last data filed at 12/23/16 1100  Gross per 24 hour  Intake            589.6 ml  Output             2770 ml  Net          -2180.4 ml     Labs: CBC  Recent Labs  12/22/16 0518 12/23/16 0424  WBC 33.4* 32.6*  HGB 9.1* 9.2*  HCT 27.1* 27.0*  MCV 88.5 88.9  PLT 303 303   Basic Metabolic Panel  Recent Labs  12/22/16 0518 12/23/16 0424  NA 143 148*  K 3.5 3.7  CL 105 101  CO2 31 33*  GLUCOSE 85 135*  BUN 62* 58*  CREATININE 2.40* 2.17*  CALCIUM 8.8* 8.7*  Liver Function Tests  Recent Labs  12/21/16 0533  AST 65*  ALT 59*  ALKPHOS 121  BILITOT 0.7  PROT 6.0*  ALBUMIN 2.3*   No results for input(s): LIPASE, AMYLASE in the last 72 hours. Cardiac Enzymes  Recent Labs  12/23/16 0424  TROPONINI 0.56*   BNP Invalid input(s): POCBNP D-Dimer No results for input(s): DDIMER in the last 72 hours. Hemoglobin A1C No results for input(s): HGBA1C in the last 72 hours. Fasting Lipid Panel No results for input(s): CHOL, HDL, LDLCALC, TRIG, CHOLHDL, LDLDIRECT in the last 72 hours. Thyroid Function Tests No results for input(s): TSH, T4TOTAL, T3FREE, THYROIDAB in the last 72 hours.  Invalid input(s):  FREET3  Weights: Filed Weights   12/09/2016 1612 12/23/2016 2010  Weight: 139 lb (63 kg) 138 lb 14.4 oz (63 kg)     Radiology/Studies:  Dg Chest 1 View  Result Date: 12/20/2016 CLINICAL DATA:  Shortness of breath . EXAM: CHEST 1 VIEW COMPARISON:  01-18-2017 . FINDINGS: Interim placement of NG tube, its tip is below the left hemidiaphragm. Endotracheal tube, right IJ line stable position. Cardiomegaly with bilateral pulmonary alveolar infiltrates consistent with CHF. Bilateral pneumonia cannot be excluded. No pneumothorax. IMPRESSION: 1. Interim placement of NG tube, its tip is below the left hemidiaphragm. Endotracheal tube and right IJ line in stable position. 2. Cardiomegaly with bilateral pulmonary infiltrates consistent pulmonary edema. Bilateral pneumonia cannot be excluded. No change from prior exam . Electronically Signed   By: Maisie Fus  Register   On: 12/20/2016 06:55   Dg Chest 1 View  Result Date: 01/18/2017 CLINICAL DATA:  Status post intubation and central line placement EXAM: CHEST 1 VIEW COMPARISON:  01-18-2017 FINDINGS: Cardiac shadow is stable. Diffuse bilateral airspace disease is again seen. Endotracheal tube is again seen in satisfactory position. Left subclavian central line extends in the left internal jugular vein stable from the prior study. No acute bony abnormality is noted. IMPRESSION: Stable bilateral airspace disease. Stable appearance of endotracheal tube and left subclavian vein central catheter with the tip in the left internal jugular vein Electronically Signed   By: Alcide Clever M.D.   On: 01-18-17 19:23   Dg Chest 1 View  Result Date: 01/18/2017 CLINICAL DATA:  Dyspnea per physician order. Last night seizure-like activity with vomiting and shortness of breath, Pt was admitted 12/27/2016 for bilateral chest wall pain, shortness of breath, dyspnea on exertion, and generalized weakness. EXAM: CHEST 1 VIEW COMPARISON:  12/18/2016 FINDINGS: There are bilateral interstitial  and alveolar airspace opacities involving the upper and lower lungs. There are trace bilateral pleural effusions. There is no pneumothorax. There is stable cardiomegaly. The osseous structures are unremarkable. IMPRESSION: Bilateral interstitial and alveolar airspace opacities throughout bilateral lungs which may reflect florid pulmonary edema versus multi lobar pneumonia. Electronically Signed   By: Elige Ko   On: 01/18/17 13:15   Dg Chest 2 View  Result Date: 01/03/2017 CLINICAL DATA:  LEFT chest pain beginning a few days ago. Dizziness and shortness of breath. History of CHF, diabetes. EXAM: CHEST  2 VIEW COMPARISON:  Chest radiograph December 29, 2015 FINDINGS: Cardiomediastinal silhouette is normal. Mildly calcified aortic knob. Increasing mild interstitial prominence with faint RIGHT upper lobe airspace opacities. Similar bibasilar strandy densities. Flattened hemidiaphragms. No pleural effusion. No pneumothorax. Osteopenia. Broad dextroscoliosis and mild degenerative change of the spine. Old LEFT rib fractures. Surgical clips in the included right abdomen compatible with cholecystectomy. IMPRESSION: Increasing interstitial prominence with patchy RIGHT upper lobe airspace opacities concerning for  bronchopneumonia. Followup PA and lateral chest X-ray is recommended in 3-4 weeks following trial of antibiotic therapy to ensure resolution and exclude underlying malignancy. Similar bibasilar atelectasis/scarring. Electronically Signed   By: Awilda Metroourtnay  Bloomer M.D.   On: 12/18/2016 16:57   Dg Abd 1 View  Result Date: 12/21/2016 CLINICAL DATA:  OG tube placement EXAM: ABDOMEN - 1 VIEW COMPARISON:  None. FINDINGS: Enteric tube with tip in the left upper quadrant consistent with location in the body of the stomach. Right femoral venous catheter with tip over the inferior vena caval atrial junction. Scattered gas and stool throughout the colon. No bowel distention. Atelectasis or infiltration seen in the  lung bases. Lumbar scoliosis convex towards the left with degenerative change. Aortic calcification. IMPRESSION: Enteric tube tip is in the left upper quadrant consistent with location in the body of the stomach. Electronically Signed   By: Burman NievesWilliam  Stevens M.D.   On: 01/05/2017 23:21   Ct Head Wo Contrast  Result Date: 12/18/2016 CLINICAL DATA:  Acute onset of stroke-like symptoms. Difficulty with speech, and possible seizure like activity. Initial encounter. EXAM: CT HEAD WITHOUT CONTRAST TECHNIQUE: Contiguous axial images were obtained from the base of the skull through the vertex without intravenous contrast. COMPARISON:  CT of the head performed 12/08/2016 FINDINGS: Brain: No evidence of acute infarction, hemorrhage, hydrocephalus, extra-axial collection or mass lesion/mass effect. Prominence of the sulci suggests mild cortical volume loss. Mild cerebellar atrophy is noted. Mild periventricular white matter change likely reflects small vessel ischemic microangiopathy. The brainstem and fourth ventricle are within normal limits. The basal ganglia are unremarkable in appearance. The cerebral hemispheres demonstrate grossly normal gray-white differentiation. No mass effect or midline shift is seen. Vascular: No hyperdense vessel or unexpected calcification. Skull: There is no evidence of fracture; visualized osseous structures are unremarkable in appearance. Sinuses/Orbits: The visualized portions of the orbits are within normal limits. The paranasal sinuses and mastoid air cells are well-aerated. Other: No significant soft tissue abnormalities are seen. IMPRESSION: 1. No acute intracranial pathology seen on CT. 2. Mild cortical volume loss and scattered small vessel ischemic microangiopathy. Electronically Signed   By: Roanna RaiderJeffery  Chang M.D.   On: 12/18/2016 17:44   Ct Head Wo Contrast  Result Date: 12/08/2016 CLINICAL DATA:  Larey SeatFell in the bedroom, striking her head. Hematoma posteriorly. EXAM: CT HEAD WITHOUT  CONTRAST CT CERVICAL SPINE WITHOUT CONTRAST TECHNIQUE: Multidetector CT imaging of the head and cervical spine was performed following the standard protocol without intravenous contrast. Multiplanar CT image reconstructions of the cervical spine were also generated. COMPARISON:  None. FINDINGS: CT HEAD FINDINGS Brain: There is no intracranial hemorrhage, mass or evidence of acute infarction. There is mild chronic microvascular ischemic change. There is no significant extra-axial fluid collection. No acute intracranial findings are evident. Vascular: No hyperdense vessel or unexpected calcification. Skull: Normal. Negative for fracture or focal lesion. Sinuses/Orbits: No acute finding. Other: None. CT CERVICAL SPINE FINDINGS Alignment: Normal. Skull base and vertebrae: No acute fracture. No primary bone lesion or focal pathologic process. Soft tissues and spinal canal: No prevertebral fluid or swelling. No visible canal hematoma. Disc levels: Moderate degenerative cervical disc disease at C5-6 and C6-7. Slight anterolisthesis at Celsius 3 4 and C4-5, likely degenerative. Moderate cervical facet arthritis. Upper chest: Negative. Other: None IMPRESSION: 1. No acute intracranial findings. There is mild chronic appearing white matter hypodensities which likely represent small vessel ischemic disease. 2. Negative for acute cervical spine fracture Electronically Signed   By: Rosey Bathaniel R Mitchell M.D.  On: 12/08/2016 22:17   Ct Cervical Spine Wo Contrast  Result Date: 12/08/2016 CLINICAL DATA:  Larey Seat in the bedroom, striking her head. Hematoma posteriorly. EXAM: CT HEAD WITHOUT CONTRAST CT CERVICAL SPINE WITHOUT CONTRAST TECHNIQUE: Multidetector CT imaging of the head and cervical spine was performed following the standard protocol without intravenous contrast. Multiplanar CT image reconstructions of the cervical spine were also generated. COMPARISON:  None. FINDINGS: CT HEAD FINDINGS Brain: There is no intracranial  hemorrhage, mass or evidence of acute infarction. There is mild chronic microvascular ischemic change. There is no significant extra-axial fluid collection. No acute intracranial findings are evident. Vascular: No hyperdense vessel or unexpected calcification. Skull: Normal. Negative for fracture or focal lesion. Sinuses/Orbits: No acute finding. Other: None. CT CERVICAL SPINE FINDINGS Alignment: Normal. Skull base and vertebrae: No acute fracture. No primary bone lesion or focal pathologic process. Soft tissues and spinal canal: No prevertebral fluid or swelling. No visible canal hematoma. Disc levels: Moderate degenerative cervical disc disease at C5-6 and C6-7. Slight anterolisthesis at Celsius 3 4 and C4-5, likely degenerative. Moderate cervical facet arthritis. Upper chest: Negative. Other: None IMPRESSION: 1. No acute intracranial findings. There is mild chronic appearing white matter hypodensities which likely represent small vessel ischemic disease. 2. Negative for acute cervical spine fracture Electronically Signed   By: Ellery Plunk M.D.   On: 12/08/2016 22:17   Mr Brain Wo Contrast  Result Date: 12/18/2016 CLINICAL DATA:  Episode of seizure like activity. Slurred speech. Tremors. Disorientation. EXAM: MRI HEAD WITHOUT CONTRAST TECHNIQUE: Multiplanar, multiecho pulse sequences of the brain and surrounding structures were obtained without intravenous contrast. COMPARISON:  Head CT same day.  Head CT 12/08/2016. FINDINGS: Brain: Diffusion imaging does not show any acute or subacute infarction. The study does suffer from considerable motion degradation. The brainstem is negative. There are old small vessel infarctions affecting the cerebellum bilaterally. Cerebral hemispheres show moderate chronic small-vessel ischemic changes of the deep white matter. No large vessel territory infarction. No mass lesion, hemorrhage, hydrocephalus or extra-axial collection. No pituitary mass. Vascular: Major vessels  at the base of the brain show flow. Skull and upper cervical spine: Negative Sinuses/Orbits: Clear/normal Other: None significant IMPRESSION: No acute finding. 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 Moderate chronic small-vessel ischemic changes of the cerebral hemispheric white matter. No specific cause of seizure identified. These results will be called to the ordering clinician or representative by the Radiologist Assistant, and communication documented in the PACS or zVision Dashboard. Electronically Signed   By: Paulina Fusi M.D.   On: 12/18/2016 20:56   US Renal  Result Date: 12/09/2016 CLINICAL DATA:  Chronic kidney disease EXAM: RENAL / URINARY TRACT ULTRASOUND COMPLETE COMPARISON:  None. FINDINGS: Right Kidney: Length: 8.4 cm. No renal mass or hydronephrosis. Renal cortical thinning with increased echogenicity. Left Kidney: Length: 9.2 cm. No renal mass or hydronephrosis. Renal cortical thinning with increased echogenicity. Bladder: Foley catheter in place.  Decompressed. IMPRESSION: 1. Atrophic bilateral kidneys with renal cortical thinning and increased renal echogenicity as can be seen with medical renal disease. Electronically Signed   By: Elige Ko   On: 12/11/2016 13:20   Dg Chest Port 1 View  Result Date: 12/23/2016 CLINICAL DATA:  Respiratory failure EXAM: PORTABLE CHEST 1 VIEW COMPARISON:  12/22/2016 FINDINGS: Stable cardiomegaly with aortic atherosclerosis. Right IJ line terminates in the distal SVC. Emphysematous hyperinflation of the left upper lobe and right mid lung with adjacent areas of confluent airspace opacities consistent with pulmonary edema. Superimposed pneumonia is  not entirely excluded. Cardiac monitoring leads project over the thorax and epigastric region. No acute nor suspicious osseous lesions. IMPRESSION: Diffuse airspace opacities consistent with pulmonary edema. Superimposed pneumonia cannot entirely excluded. There is sparing of the left upper lobe  and right mid lung likely from emphysematous hyperinflation. Electronically Signed   By: Tollie Eth M.D.   On: 12/23/2016 03:38   Dg Chest Port 1 View  Result Date: 12/22/2016 CLINICAL DATA:  Respiratory failure EXAM: PORTABLE CHEST 1 VIEW COMPARISON:  12/21/2016 FINDINGS: Endotracheal tube and nasogastric catheter been removed in the interval. Right jugular central line in temporary pacemaker are again seen and stable. Cardiac shadow remains mildly enlarged. Diffuse bilateral infiltrates are again seen known somewhat increased particularly in the left mid lung. The bony structures are within normal limits. IMPRESSION: Diffuse bilateral infiltrates increased on the left. Electronically Signed   By: Alcide Clever M.D.   On: 12/22/2016 07:08   Dg Chest Port 1 View  Result Date: 12/21/2016 CLINICAL DATA:  Respiratory failure. EXAM: PORTABLE CHEST 1 VIEW COMPARISON:  12/20/2016. FINDINGS: Endotracheal tube, right IJ line, NG tube in stable position. Heart size stable. Prominent bilateral pulmonary infiltrates again noted. No pleural effusion. No pneumothorax. IMPRESSION: 1.  Lines and tubes in stable position. 2. Stable cardiomegaly. Prominent bilateral pulmonary infiltrates/edema again noted. No significant interim change. Electronically Signed   By: Maisie Fus  Register   On: 12/21/2016 06:39   Dg Chest Port 1 View  Result Date: 12/14/2016 CLINICAL DATA:  Central line placement EXAM: PORTABLE CHEST 1 VIEW COMPARISON:  Film from earlier in the same day FINDINGS: Bilateral airspace disease is again identified. The endotracheal tube is in satisfactory position. The previously seen left subclavian central line has been removed. A new right jugular central line is noted in satisfactory position. No pneumothorax is noted. IMPRESSION: Status post right jugular central line placement in satisfactory position. The remainder of the exam is stable. Electronically Signed   By: Alcide Clever M.D.   On: 12/26/2016 21:14    Dg Chest Port 1 View  Result Date: 01/01/2017 CLINICAL DATA:  Endotracheal tube placement. Central line placement. EXAM: PORTABLE CHEST 1 VIEW COMPARISON:  Earlier same day FINDINGS: Defibrillator pads overlie the chest. Pacemaker has been placed from an inferior approach with the tip in the region of the right ventricle. Endotracheal tube has its tip 5 cm above the carina. Left subclavian central line turns up the left internal jugular vein, tip not visible. Diffuse airspace density persists but is improved. IMPRESSION: Less diffuse airspace density/ improved pulmonary aeration. Endotracheal tube tip 5 cm above the carina. Left subclavian central line turns up the left IJ, tip not visible. Pacemaker placed from below has the lead tip in the region of the right ventricle. These results will be called to the ordering clinician or representative by the Radiologist Assistant, and communication documented in the PACS or zVision Dashboard. Electronically Signed   By: Paulina Fusi M.D.   On: 12/20/2016 18:46   Dg Chest Port 1 View  Result Date: 12/18/2016 CLINICAL DATA:  Cough. EXAM: PORTABLE CHEST 1 VIEW COMPARISON:  12-18-16 FINDINGS: Borderline heart size with normal pulmonary vascularity. Patchy airspace infiltrates are demonstrated in the right upper lung and left perihilar region as well as in the lung bases. There is progression since previous study. This likely represents multifocal pneumonia. Edema less likely. No blunting of costophrenic angles. No pneumothorax. Tortuous aorta. Degenerative changes in the spine. IMPRESSION: Increasing airspace disease in both lungs suggesting  multifocal pneumonia. Electronically Signed   By: Burman Nieves M.D.   On: 12/18/2016 00:23     Assessment and Plan  76 y.o. female  1. Complete heart block: -Status post venous temp wire on 2/11 for heart block heart rate of 28 with hypotension Appears to have atrial tachycardia, now with rate 116 bpm Unable to  exclude accelerated junctional rhythm versus atrial tachycardia with variable block Temporal wire removed today by myself We'll need to monitor rhythm closely For tachycardia, could try amiodarone, could also try esmolol infusion Rate may also improve with Precedex, which has been scheduled to start this morning  2. Acute respiratory failure/PNA requiring intubation: -Likely multifactorial including PNA and acute diastolic CHF, made worse by severe aortic valve stenosis -Not on sedation Responding to Lasix, post ATN diuresis Today's x-ray reviewed, Mild improvement in chest x-ray compared to yesterday  3. Sepsis with PNA: -Cannot rule out septic shock, seems to have improved Off of pressors  4. Acute diastolic CHF: Continue Lasix IV Post ATN diuresis  5. Acute on chronic renal failure: Improving, followed by nephrology Suspect ATN   6. Severe aortic stenosis: -Measurements are severe on echocardiogram -Likely contributing to CHF symptoms -Will need to stabilize respiratory status, heart rate, If she recovers from current acute respiratory distress, Would need to manage her arrhythmia issues Then would see if she would be a candidate for TAVR, she would need R/LHC prior We will try to discuss with family  7. Anemia: -Unable to to exclude iron deficiency vs anemia of chronic disease -Maintain hgb > 8.5  8. Elevated troponin: -Likely supply demand ischemia 2/2 the above -May require ischemic evaluation pending her recovery   Pacing wire removed with nursing help, case discussed with nursing and with hospitalist as well as intensivist  Total encounter time more than 60 minutes  Greater than 50% was spent in counseling and coordination of care with the patient   Signed, Dossie Arbour, MD, Ph.D. [facility] 12/23/2016, 1:00 PM

## 2016-12-23 NOTE — Progress Notes (Signed)
Subjective:   Patient is off BiPAP, currently on nonrebreathing mask.  Sleeping.  On precedex and esmolol was started Given iv ativan for delirium last night Sinus tachycardic.  S Cr continues today 2.17 (2.40) (2.91) UOP 4670 cc yesterday.  Given 40 mg Lasix IV.  Off vasopressors.  Temporary pacemaker has been removed   Objective:  Vital signs in last 24 hours:  Temp:  [96.3 F (35.7 C)-98 F (36.7 C)] 97.6 F (36.4 C) (02/15 0000) Pulse Rate:  [109-128] 127 (02/15 0600) Resp:  [22-35] 28 (02/15 0600) BP: (109-151)/(69-90) 109/73 (02/15 0600) SpO2:  [91 %-100 %] 95 % (02/15 0600) FiO2 (%):  [50 %-60 %] 50 % (02/15 0300)  Weight change:  Filed Weights   12/10/2016 1612 12/31/2016 2010  Weight: 63 kg (139 lb) 63 kg (138 lb 14.4 oz)    Intake/Output:    Intake/Output Summary (Last 24 hours) at 12/23/16 1106 Last data filed at 12/23/16 0902  Gross per 24 hour  Intake              230 ml  Output             3670 ml  Net            -3440 ml     Physical Exam: General: Critically ill  HEENT  nonrebreathing mask  Neck No masses  Pulm/lungs coarse breath sounds, with diffuse crackles.   CVS/Heart Sinus tachycardia rhythm, 2/6 systolic murmur  Abdomen:  Soft  Extremities: No significant edema  Neurologic: asleep  Skin: No acute rashes   Foley in place       Basic Metabolic Panel:   Recent Labs Lab 12/18/2016 0514  12/27/2016 2351 12/20/16 0416 12/21/16 0533 12/22/16 0518 12/23/16 0424  NA 136  < > 133* 136 138 143 148*  K 5.0  < > 3.8 3.4* 4.2 3.5 3.7  CL 107  < > 100* 99* 103 105 101  CO2 20*  < > 20* 26 25 31  33*  GLUCOSE 114*  < > 600* 619* 135* 85 135*  BUN 38*  < > 53* 58* 66* 62* 58*  CREATININE 2.65*  < > 3.54* 3.48* 2.91* 2.40* 2.17*  CALCIUM 8.6*  < > 8.0* 7.9* 8.1* 8.8* 8.7*  MG 1.7  --  1.9 1.9  --   --   --   PHOS  --   --  7.3*  --   --   --   --   < > = values in this interval not displayed.   CBC:  Recent Labs Lab 12/15/2016 2135  12/20/16 0416 12/21/16 0533 12/22/16 0518 12/23/16 0424  WBC 19.0* 20.3* 28.5* 33.4* 32.6*  HGB 7.4* 9.5* 8.0* 9.1* 9.2*  HCT 23.2* 29.2* 24.4* 27.1* 27.0*  MCV 91.7 92.0 89.3 88.5 88.9  PLT 312 332 257 303 303      Microbiology:  Recent Results (from the past 720 hour(s))  MRSA PCR Screening     Status: Abnormal   Collection Time: 12/13/2016 10:51 PM  Result Value Ref Range Status   MRSA by PCR POSITIVE (A) NEGATIVE Final    Comment:        The GeneXpert MRSA Assay (FDA approved for NASAL specimens only), is one component of a comprehensive MRSA colonization surveillance program. It is not intended to diagnose MRSA infection nor to guide or monitor treatment for MRSA infections. RESULT CALLED TO, READ BACK BY AND VERIFIED WITH: CYNTHIA BURGESS @ 0021 ON 12/17/2016 BY  CAF   Culture, blood (Routine X 2) w Reflex to ID Panel     Status: None (Preliminary result)   Collection Time: 12/13/2016  1:43 PM  Result Value Ref Range Status   Specimen Description BLOOD RIGHT ARM  Final   Special Requests   Final    BOTTLES DRAWN AEROBIC AND ANAEROBIC AER 6CC ANA 4CC   Culture NO GROWTH 4 DAYS  Final   Report Status PENDING  Incomplete  Culture, blood (Routine X 2) w Reflex to ID Panel     Status: None (Preliminary result)   Collection Time: 01/04/2017  1:43 PM  Result Value Ref Range Status   Specimen Description BLOOD LEFT HAND  Final   Special Requests   Final    BOTTLES DRAWN AEROBIC AND ANAEROBIC AER 6CC ANA 7CC   Culture NO GROWTH 4 DAYS  Final   Report Status PENDING  Incomplete  Urine culture     Status: None   Collection Time: 12/26/2016  1:50 PM  Result Value Ref Range Status   Specimen Description URINE, RANDOM  Final   Special Requests NONE  Final   Culture   Final    NO GROWTH Performed at [facility], [address], [place], [place] [address]    Report Status 12/21/2016 FINAL  Final  Culture, respiratory (NON-Expectorated)     Status: None (Preliminary  result)   Collection Time: 12/20/16  3:51 PM  Result Value Ref Range Status   Specimen Description TRACHEAL ASPIRATE  Final   Special Requests NONE  Final   Gram Stain   Final    ABUNDANT WBC PRESENT,BOTH PMN AND MONONUCLEAR FEW YEAST    Culture   Final    CULTURE REINCUBATED FOR BETTER GROWTH Performed at [facility], [address] [place], [place] [address]    Report Status PENDING  Incomplete    Coagulation Studies: No results for input(s): LABPROT, INR in the last 72 hours.  Urinalysis: No results for input(s): COLORURINE, LABSPEC, PHURINE, GLUCOSEU, HGBUR, BILIRUBINUR, KETONESUR, PROTEINUR, UROBILINOGEN, NITRITE, LEUKOCYTESUR in the last 72 hours.  Invalid input(s): APPERANCEUR    Imaging: Dg Chest Port 1 View  Result Date: 12/23/2016 CLINICAL DATA:  Respiratory failure EXAM: PORTABLE CHEST 1 VIEW COMPARISON:  12/22/2016 FINDINGS: Stable cardiomegaly with aortic atherosclerosis. Right IJ line terminates in the distal SVC. Emphysematous hyperinflation of the left upper lobe and right mid lung with adjacent areas of confluent airspace opacities consistent with pulmonary edema. Superimposed pneumonia is not entirely excluded. Cardiac monitoring leads project over the thorax and epigastric region. No acute nor suspicious osseous lesions. IMPRESSION: Diffuse airspace opacities consistent with pulmonary edema. Superimposed pneumonia cannot entirely excluded. There is sparing of the left upper lobe and right mid lung likely from emphysematous hyperinflation. Electronically Signed   By: Tollie Eth M.D.   On: 12/23/2016 03:38   Dg Chest Port 1 View  Result Date: 12/22/2016 CLINICAL DATA:  Respiratory failure EXAM: PORTABLE CHEST 1 VIEW COMPARISON:  12/21/2016 FINDINGS: Endotracheal tube and nasogastric catheter been removed in the interval. Right jugular central line in temporary pacemaker are again seen and stable. Cardiac shadow remains mildly enlarged. Diffuse bilateral  infiltrates are again seen known somewhat increased particularly in the left mid lung. The bony structures are within normal limits. IMPRESSION: Diffuse bilateral infiltrates increased on the left. Electronically Signed   By: Alcide Clever M.D.   On: 12/22/2016 07:08     Medications:   . dexmedetomidine 0.4 mcg/kg/hr (  12/23/16 0856)  . dextrose    . esmolol 25 mcg/kg/min (12/23/16 0936)   . aspirin  300 mg Rectal Daily  . ceFEPIme  1 g Intravenous Q24H  . chlorhexidine  15 mL Mouth Rinse BID  . dextrose  1 ampule Intravenous Once  . famotidine (PEPCID) IV  20 mg Intravenous Q24H  . heparin  5,000 Units Subcutaneous Q8H  . insulin aspart  0-9 Units Subcutaneous Q4H  . ipratropium-albuterol  3 mL Nebulization Q6H  . levothyroxine  50 mcg Intravenous Daily  . mouth rinse  15 mL Mouth Rinse q12n4p  . potassium chloride (KCL MULTIRUN) 30 mEq in 265 mL IVPB  30 mEq Intravenous Once  . sodium chloride flush  10-40 mL Intracatheter Q12H   acetaminophen, fentaNYL (SUBLIMAZE) injection, lip balm  Assessment/ Plan:  76 y.o. female with diabetes mellitus type II, hypertension, peripheral vascular disease, obstructive sleep apnea, congestive heart failure, anemia, hypothyroidism, sjogren's, hyperlipidemia, seizure disorder and questionable lupus, who was admitted to [facility] on 12/26/2016   1. ARF on CKD s 4. Baseline Cr 1.77/GFR 27 (admission 12/20/2016) 2. B/l atrophic kidneys noted on u/s 3. Acute respiratory failure - biPAP 4. DM-2/CKD  5. Hypernatremia   Underlying CKD is likely secondary to DM and atherosclerosis ARF is likely due to abnormal cardiorenal hemodymacs from Ao Stenosis, Mitral regurgitation, Severe Pulmonary HTN  and possibly ATN from recent events (hypotension)  PLAN S Cr continues to improved to 2.17. Close to baseline. Good UOP. Responding to lasix.  No acute indication for HD at present Low dose D5 supplementation for hypernatremia.  Continue to monitor urine  output. Continue lasix for pulmonary edema    LOS: 7 Cleo Santucci 2/15/201811:06 AM

## 2016-12-23 NOTE — Progress Notes (Addendum)
[facility]  Critical Care Medicine Consultation    Name: Chloe Sanchez MRN: [id number] DOB: August 18, 1941    ADMISSION DATE:  12/18/2016 CONSULTATION DATE:  09-11-17  REFERRING MD : Dr. Imogene Burnhen  SUBJ:   Very agitated 02/15. Tachycardic. Both much improved after dexmedetomidine and esmolol infusions initiated  VITAL SIGNS: Temp:  [96.3 F (35.7 C)-98 F (36.7 C)] 97.6 F (36.4 C) (02/15 0000) Pulse Rate:  [109-128] 127 (02/15 0600) Resp:  [22-35] 28 (02/15 0600) BP: (109-151)/(69-90) 109/73 (02/15 0600) SpO2:  [91 %-100 %] 95 % (02/15 0600) FiO2 (%):  [50 %-60 %] 50 % (02/15 0300) HEMODYNAMICS:   VENTILATOR SETTINGS: FiO2 (%):  [50 %-60 %] 50 % INTAKE / OUTPUT:  Intake/Output Summary (Last 24 hours) at 12/23/16 1122 Last data filed at 12/23/16 0902  Gross per 24 hour  Intake              230 ml  Output             3670 ml  Net            -3440 ml    Physical Examination:  Vitals:   12/23/16 0310 12/23/16 0400 12/23/16 0500 12/23/16 0600  BP:  115/69 120/77 109/73  Pulse: (!) 118 (!) 115 (!) 128 (!) 127  Resp: (!) 24 (!) 28 (!) 29 (!) 28  Temp:      TempSrc:      SpO2: 91% 93% 95% 95%  Weight:      Height:       RASS+2, not F/C HEENT WNL JVP not well visualized Scattered rales and rhonchi, no wheezes Tachy, reg, + syst M Abd soft, +BS Ext warm, no edema No focal neuro deficits  LABS: Reviewed   LABORATORY PANEL:   CBC  Recent Labs Lab 12/23/16 0424  WBC 32.6*  HGB 9.2*  HCT 27.0*  PLT 303    Chemistries   Recent Labs Lab 011-04-18 2351 12/20/16 0416 12/21/16 0533  12/23/16 0424  NA 133* 136 138  < > 148*  K 3.8 3.4* 4.2  < > 3.7  CL 100* 99* 103  < > 101  CO2 20* 26 25  < > 33*  GLUCOSE 600* 619* 135*  < > 135*  BUN 53* 58* 66*  < > 58*  CREATININE 3.54* 3.48* 2.91*  < > 2.17*  CALCIUM 8.0* 7.9* 8.1*  < > 8.7*  MG 1.9 1.9  --   --   --   PHOS 7.3*  --   --   --   --   AST 136*  --  65*  --   --   ALT 74*  --  59*  --   --     ALKPHOS 152*  --  121  --   --   BILITOT 0.8  --  0.7  --   --   < > = values in this interval not displayed.   Recent Labs Lab 12/22/16 2121 12/22/16 2356 12/23/16 0337 12/23/16 0547 12/23/16 0747 12/23/16 1037  GLUCAP 111* 121* 132* 133* 139* 105*    Recent Labs Lab 011-04-18 2036 011-04-18 2350 12/20/16 0421  PHART 7.01* 7.17* 7.28*  PCO2ART 59* 52* 54*  PO2ART 225* 128* 163*    Recent Labs Lab 011-04-18 2351 12/21/16 0533  AST 136* 65*  ALT 74* 59*  ALKPHOS 152* 121  BILITOT 0.8 0.7  ALBUMIN 2.5* 2.3*    Cardiac Enzymes  Recent Labs Lab 12/23/16 0424  TROPONINI 0.56*   PCT 2.34 >59.38> 79.11  RADIOLOGY:  CXR: improved edema pattern   ASSESSMENT/PLAN   76 year old female with acute respiratory failure, with acute aspiration pneumonitis/pneumonia with severe pulmonary edema. 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 Cr improving 9) Hypernatremia 10) Nausea, resolved 11) Severe agitated encephalopathy/delirium  1) Cont supplemental O2 and PRN BiPAP 2) Discussed with Cardiology. Esmolol infusion to maintain HR 70 - 100/min 3) Lasix repeated X 1 02/15 4) Cont Vanc/Cefepime for now - cont to follow procalcitonin and F/U cx results. Consider DC abx 02/16 if all cultures remain negative 5) Monitor BMET intermittently, Monitor I/Os, Correct electrolytes as indicated 6) Further cardiac eval and mgmt per Cards 7) Renal following 9) D5W initiated 02/15 10) Dexmedetomidine infusion initiated  11) Discussed with pt's son. Confirmed that pt is DNR if arrests and do not re-intubate  CCM time: 35  mins The above time includes time spent in consultation with patient and/or family members and reviewing care plan on multidisciplinary rounds  Billy Fischer, MD PCCM service Mobile [phone] Pager [phone]   12/23/2016

## 2016-12-24 ENCOUNTER — Inpatient Hospital Stay: Payer: Medicare HMO

## 2016-12-24 DIAGNOSIS — I471 Supraventricular tachycardia: Secondary | ICD-10-CM

## 2016-12-24 LAB — GLUCOSE, CAPILLARY
GLUCOSE-CAPILLARY: 127 mg/dL — AB (ref 65–99)
GLUCOSE-CAPILLARY: 133 mg/dL — AB (ref 65–99)
GLUCOSE-CAPILLARY: 149 mg/dL — AB (ref 65–99)
GLUCOSE-CAPILLARY: 176 mg/dL — AB (ref 65–99)
GLUCOSE-CAPILLARY: 148 mg/dL — AB (ref 65–99)
GLUCOSE-CAPILLARY: 146 mg/dL — AB (ref 65–99)
Glucose-Capillary: 118 mg/dL — ABNORMAL HIGH (ref 65–99)

## 2016-12-24 LAB — CBC
HEMATOCRIT: 27 % — AB (ref 35.0–47.0)
Hemoglobin: 9.1 g/dL — ABNORMAL LOW (ref 12.0–16.0)
MCH: 29.9 pg (ref 26.0–34.0)
MCHC: 33.5 g/dL (ref 32.0–36.0)
MCV: 89.2 fL (ref 80.0–100.0)
PLATELETS: 300 10*3/uL (ref 150–440)
RBC: 3.03 MIL/uL — ABNORMAL LOW (ref 3.80–5.20)
RDW: 15.1 % — AB (ref 11.5–14.5)
WBC: 21.5 10*3/uL — ABNORMAL HIGH (ref 3.6–11.0)

## 2016-12-24 LAB — BLOOD GAS, ARTERIAL
ACID-BASE DEFICIT: 9 mmol/L — AB (ref 0.0–2.0)
BICARBONATE: 16.8 mmol/L — AB (ref 20.0–28.0)
FIO2: 0.36
O2 Saturation: 69.2 %
PCO2 ART: 35 mmHg (ref 32.0–48.0)
PH ART: 7.29 — AB (ref 7.350–7.450)
Patient temperature: 37
pO2, Arterial: 41 mmHg — ABNORMAL LOW (ref 83.0–108.0)

## 2016-12-24 LAB — COMPREHENSIVE METABOLIC PANEL
ALT: 34 U/L (ref 14–54)
ANION GAP: 10 (ref 5–15)
AST: 30 U/L (ref 15–41)
Albumin: 2.5 g/dL — ABNORMAL LOW (ref 3.5–5.0)
Alkaline Phosphatase: 138 U/L — ABNORMAL HIGH (ref 38–126)
BILIRUBIN TOTAL: 0.9 mg/dL (ref 0.3–1.2)
BUN: 50 mg/dL — ABNORMAL HIGH (ref 6–20)
CO2: 37 mmol/L — ABNORMAL HIGH (ref 22–32)
Calcium: 8.7 mg/dL — ABNORMAL LOW (ref 8.9–10.3)
Chloride: 100 mmol/L — ABNORMAL LOW (ref 101–111)
Creatinine, Ser: 1.83 mg/dL — ABNORMAL HIGH (ref 0.44–1.00)
GFR calc Af Amer: 30 mL/min — ABNORMAL LOW (ref >60)
GFR, EST NON AFRICAN AMERICAN: 26 mL/min — AB (ref >60)
Glucose, Bld: 146 mg/dL — ABNORMAL HIGH (ref 65–99)
POTASSIUM: 3.6 mmol/L (ref 3.5–5.1)
Sodium: 147 mmol/L — ABNORMAL HIGH (ref 135–145)
TOTAL PROTEIN: 7 g/dL (ref 6.5–8.1)

## 2016-12-24 LAB — CULTURE, BLOOD (ROUTINE X 2)
CULTURE: NO GROWTH
CULTURE: NO GROWTH

## 2016-12-24 LAB — AMMONIA: Ammonia: <9 umol/L — ABNORMAL LOW (ref 9–35)

## 2016-12-24 MED ORDER — STERILE WATER FOR INJECTION IJ SOLN
INTRAMUSCULAR | Status: AC
  Administered 2016-12-24: 5 mL
  Filled 2016-12-24: qty 10

## 2016-12-24 MED ORDER — JEVITY 1.2 CAL PO LIQD: 1000.0000 mL | ORAL | Status: DC

## 2016-12-24 MED ORDER — FUROSEMIDE 10 MG/ML IJ SOLN
40.0000 mg | Freq: Two times a day (BID) | INTRAMUSCULAR | Status: DC
  Administered 2016-12-24 – 2016-12-28 (×9): 40 mg via INTRAVENOUS
  Filled 2016-12-24 (×9): qty 4

## 2016-12-24 NOTE — Progress Notes (Signed)
Pharmacy Antibiotic Note  Chloe Sanchez is a 76 y.o. female admitted on 01/02/2017 with pneumonia.  Pharmacy has been consulted for vancomycin and cefepime dosing.  Plan: Patient last received vancomycin x 1 on 2/15. Patient has scheduled vancomycin random level on 2/17. Will continue with current plan.   Continue cefepime 1g IV Q24hr.    Height: 5\' 2"  (157.5 cm) Weight: 138 lb 14.4 oz (63 kg) IBW/kg (Calculated) : 50.1  Temp (24hrs), Avg:97.9 F (36.6 C), Min:97.5 F (36.4 C), Max:98.3 F (36.8 C)   Recent Labs Lab 12/18/2016 2140  12/20/16 0416 12/20/16 0421  12/21/16 0533 12/21/16 1340 12/22/16 0518 12/23/16 0424 12/24/16 0400  WBC  --   --  20.3*  --   --  28.5*  --  33.4* 32.6* 21.5*  CREATININE  --   < > 3.48*  --   --  2.91*  --  2.40* 2.17* 1.83*  LATICACIDVEN 3.9*  --   --  2.4*  --   --   --   --   --   --   VANCORANDOM  --   --   --   --   < >  --  18  --  11  --   < > = values in this interval not displayed.  Estimated Creatinine Clearance: 23.2 mL/min (by C-G formula based on SCr of 1.83 mg/dL (H)).    No Known Allergies   Antiinfectives:   Azithromycin 2/8 >> 2/8 Ceftriaxone 2/9 >> Levofloxacin 2/9 >> 2/9  Zosyn 2/10 >>2/11 Cefepime 2/11 >> Vancomycin 2/11 >>   Results:   2/11 BCx: no growth  2/12 Respiratory Cx: Few Candida Glabrata  2/11 UCx: no growth  2/8  MRSA PCR: positive   Pharmacy will continue to monitor and adjust per consult.   Deepika Decatur L 12/24/2016 5:04 PM

## 2016-12-24 NOTE — Progress Notes (Addendum)
PT Cancellation Note  Patient Details Name: Chloe Sanchez MRN: [id number] DOB: 1941/04/07   Cancelled Treatment:    Reason Eval/Treat Not Completed: Medical issues which prohibited therapy. Pt transitioned to Hi-flow [place] this date on 50L of O2, however just returned back to bi-pap secondary to agitation. Pt not appropriate for therapy at this time. Of note, temp pacing wires removed 2.15.18. Pt still with noted critical aortic stenosis with complete heart block. Will dc current orders at this time. Please re-order when medically appropriate.   Gage Treiber 12/24/2016, 12:03 PM  Elizabeth PalauStephanie Destin Vinsant, PT, DPT [phone]

## 2016-12-24 NOTE — Progress Notes (Signed)
[facility] Critical Care Medicine Consultation    Name: Chloe Sanchez MRN: [id number] DOB: 06-26-1941    ADMISSION DATE:  01/02/2017 CONSULTATION DATE:  01/02/2017  REFERRING MD : Dr. Othella Boyer:   LAST 24 hours patient's rhythm has stabilized. Pacemaker wires have been removed, she was recently started on as small and is now on Precedex with regularization of her rhythm, stable hemodynamics and apparently sinus mechanism.  VITAL SIGNS: Temp:  [97.5 F (36.4 C)-98.3 F (36.8 C)] 97.5 F (36.4 C) (02/16 0000) Pulse Rate:  [65-100] 74 (02/16 0700) Resp:  [19-33] 19 (02/16 0700) BP: (102-150)/(59-79) 137/71 (02/16 0700) SpO2:  [89 %-100 %] 100 % (02/16 0700) FiO2 (%):  [100 %] 100 % (02/16 0301) HEMODYNAMICS:   VENTILATOR SETTINGS: FiO2 (%):  [100 %] 100 % INTAKE / OUTPUT:  Intake/Output Summary (Last 24 hours) at 12/24/16 0825 Last data filed at 12/24/16 0622  Gross per 24 hour  Intake          1656.51 ml  Output             2500 ml  Net          -843.49 ml    Physical Examination:  Vitals:   12/24/16 0400 12/24/16 0500 12/24/16 0600 12/24/16 0700  BP: (!) 143/77 (!) 149/79 (!) 150/71 137/71  Pulse: 83 80 71 74  Resp: (!) 23 (!) 22 (!) 24 19  Temp:      TempSrc:      SpO2: 100% 100% 100% 100%  Weight:      Height:       RASS+2, not F/C HEENT WNL JVP not well visualized Scattered rales and rhonchi, no wheezes Tachy, reg, + syst M Abd soft, +BS Ext warm, no edema No focal neuro deficits  LABS: Reviewed   LABORATORY PANEL:   CBC  Recent Labs Lab 12/24/16 0400  WBC 21.5*  HGB 9.1*  HCT 27.0*  PLT 300    Chemistries   Recent Labs Lab 12/11/2016 2351 12/20/16 0416  12/24/16 0400  NA 133* 136  < > 147*  K 3.8 3.4*  < > 3.6  CL 100* 99*  < > 100*  CO2 20* 26  < > 37*  GLUCOSE 600* 619*  < > 146*  BUN 53* 58*  < > 50*  CREATININE 3.54* 3.48*  < > 1.83*  CALCIUM 8.0* 7.9*  < > 8.7*  MG 1.9 1.9  --   --   PHOS 7.3*  --   --   --   AST 136*   --   < > 30  ALT 74*  --   < > 34  ALKPHOS 152*  --   < > 138*  BILITOT 0.8  --   < > 0.9  < > = values in this interval not displayed.   Recent Labs Lab 12/23/16 1826 12/23/16 1938 12/23/16 2340 12/24/16 0111 12/24/16 0326 12/24/16 0703  GLUCAP 166* 163* 68 127* 133* 149*    Recent Labs Lab 12/12/2016 2036 12/30/2016 2350 12/20/16 0421  PHART 7.01* 7.17* 7.28*  PCO2ART 59* 52* 54*  PO2ART 225* 128* 163*    Recent Labs Lab 12/20/2016 2351 12/21/16 0533 12/24/16 0400  AST 136* 65* 30  ALT 74* 59* 34  ALKPHOS 152* 121 138*  BILITOT 0.8 0.7 0.9  ALBUMIN 2.5* 2.3* 2.5*    Cardiac Enzymes  Recent Labs Lab 12/23/16 0424  TROPONINI 0.56*   PCT 2.34 >59.38> 79.11  RADIOLOGY:  CXR: improved edema pattern   ASSESSMENT/PLAN   76 year old female with acute respiratory failure, with acute aspiration pneumonitis/pneumonia with severe pulmonary edema. 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 Cr improving 9) Hypernatremia 10) Nausea, resolved 11) Severe agitated encephalopathy/delirium  1) Cont supplemental O2 and PRN BiPAP 2) Discussed with Cardiology. Esmolol infusion to maintain HR 70 - 100/min 3) Lasix repeated X 1 02/15 4) Cont Vanc/Cefepime for now - cont to follow procalcitonin and F/U cx results. Consider DC abx 02/16 if all cultures remain negative 5) Monitor BMET intermittently, Monitor I/Os, Correct electrolytes as indicated 6) Further cardiac eval and mgmt per Cards 7) Renal following 9) D5W initiated 02/15 10) Dexmedetomidine infusion initiated  11) Discussed with pt's son. Confirmed that pt  is DNR if arrests and do not re-intubate  CCM time: 35 mins The above time includes time spent in consultation with patient and/or family members and reviewing care plan on multidisciplinary rounds   Tora KindredJohn Singleton Hickox, DO PCCM Service 12/24/2016

## 2016-12-24 NOTE — Progress Notes (Signed)
Nutrition Follow-up  DOCUMENTATION CODES:   Not applicable  INTERVENTION:  -Discussed nutrition poc during ICU rounds, MD Conforti. Plan to place small bore feeding tube today and initiate TF as pt with inadequate nutrition likely since admission and not appropriate for diet advancement at present. Recommend Jevity 1.2 at initial rate of 20 ml/hr with goal of 60 ml/hr providing 1728 kcals, 80 g of protein and 1166 mL of free water. Recommend checking magnesium and phosphorus in AM as pt is at risk for refeeding syndrome   NUTRITION DIAGNOSIS:   Inadequate oral intake related to acute illness as evidenced by NPO status.  Being addressed via TF  GOAL:   Provide needs based on ASPEN/SCCM guidelines  MONITOR:   Vent status, Labs, Weight trends  REASON FOR ASSESSMENT:   Ventilator    ASSESSMENT:    76 yo female admitted with complete heart block, acute respiratory failure with pneumonia and CHF, acute on CKD IV  Pt transitioned to HFNC from Bipap. Pt remains encephalopathic, agitated/delerious Pt has been NPO since 2/11 but no recorded po intake prior to intubation since admission. Unsure if pt has had anything to eat since 2/8 Labs: sodium 147, Creatinine 1.83 Meds: ss novolog, lasix, D5 at 50 ml/hr  Diet Order:   NPO  Skin:  Reviewed, no issues  Last BM:  01/04/2017  Height:   Ht Readings from Last 1 Encounters:  12/21/2016 5\' 2"  (1.575 m)    Weight:   Wt Readings from Last 1 Encounters:  12/21/2016 138 lb 14.4 oz (63 kg)    Filed Weights   12/21/2016 1612 12/21/2016 2010  Weight: 139 lb (63 kg) 138 lb 14.4 oz (63 kg)  ' BMI:  Body mass index is 25.41 kg/m.  Estimated Nutritional Needs:   Kcal:  1210 kcals  Protein:  95-126 g  Fluid:  >/= 1.2 L  EDUCATION NEEDS:   No education needs identified at this time  Romelle StarcherCate Javarious Elsayed MS, RD, LDN [phone] Pager  [phone] Weekend/On-Call Pager

## 2016-12-24 NOTE — Progress Notes (Signed)
Patient: Chloe Sanchez / Admit Date: 12/18/2016 / Date of Encounter: 12/24/2016, 2:18 PM   Hospital Problem List     Active Problems:   Pneumonia   Complete heart block (HCC)   Acute renal failure syndrome (HCC)   Acute chronic obstructive pulmonary disease with respiratory distress (HCC)   Chronic kidney disease, stage III (moderate)   Cough   Dyspnea   Generalized weakness   Pleural effusion   Anemia in chronic kidney disease   Hypotension   Bradycardia   Respiratory failure (HCC)   Sepsis (HCC)   Seizure-like activity (HCC)   Severe aortic valve stenosis     Subjective   Continues to have periods of agitation, better with Precedex She was on asthma long drip, this has been weaned off her bradycardia Heart rate yesterday atrial tachycardia rate 120s, converting to normal sinus rhythm sometime yesterday late morning after esmolol was introduced. Still on BiPAP 50% No family at the bedside  Inpatient Medications    Scheduled Meds: . aspirin  300 mg Rectal Daily  . ceFEPIme  1 g Intravenous Q24H  . chlorhexidine  15 mL Mouth Rinse BID  . dextrose  1 ampule Intravenous Once  . famotidine (PEPCID) IV  20 mg Intravenous Q24H  . fentaNYL  12.5 mcg Transdermal Q72H  . furosemide  40 mg Intravenous Q12H  . heparin  5,000 Units Subcutaneous Q8H  . insulin aspart  0-9 Units Subcutaneous Q4H  . ipratropium-albuterol  3 mL Nebulization Q6H  . levothyroxine  50 mcg Intravenous Daily  . mouth rinse  15 mL Mouth Rinse q12n4p  . mupirocin ointment  1 application Nasal BID  . sodium chloride flush  10-40 mL Intracatheter Q12H   Continuous Infusions: . dexmedetomidine 0.8 mcg/kg/hr (12/24/16 1345)  . dextrose 50 mL/hr at 12/24/16 0622  . esmolol Stopped (12/24/16 1355)   PRN Meds: acetaminophen, fentaNYL (SUBLIMAZE) injection, lip balm   Objective: Telemetry: Sinus rhythm rate in the 80-90 range Physical Exam: Blood pressure 137/71, pulse 74, temperature 97.5 F (36.4  C), temperature source Oral, resp. rate 19, height 5\' 2"  (1.575 m), weight 138 lb 14.4 oz (63 kg), SpO2 100 %. Body mass index is 25.41 kg/m. GEN:on BiPAP, currently sleeping HEENT:Grossly normal.  Neck:Supple, no JVD, carotid bruits, or masses.  Cardiac:Tachycardic, III/VI systolic murmur RUSB radiating to the neck, no rubs, or gallops. No clubbing, cyanosis, edema. Radials/DP/PT 2+ and equal bilaterally.  Respiratory:Decreased breath sounds bilaterally with scattered rhonchi. ZO:XWRUGI:Soft, nontender, nondistended, BS + x 4. MS:No deformity or atrophy. Skin:Cool and dry, no rash. Neuro:Unable to perform as patient is sedated  Psych: not conversant, sleeping   Intake/Output Summary (Last 24 hours) at 12/24/16 1418 Last data filed at 12/24/16 0953  Gross per 24 hour  Intake          1296.91 ml  Output             1700 ml  Net          -403.09 ml     Labs: CBC  Recent Labs  12/23/16 0424 12/24/16 0400  WBC 32.6* 21.5*  HGB 9.2* 9.1*  HCT 27.0* 27.0*  MCV 88.9 89.2  PLT 303 300   Basic Metabolic Panel  Recent Labs  12/23/16 0424 12/24/16 0400  NA 148* 147*  K 3.7 3.6  CL 101 100*  CO2 33* 37*  GLUCOSE 135* 146*  BUN 58* 50*  CREATININE 2.17* 1.83*  CALCIUM 8.7* 8.7*   Liver Function  Tests  Recent Labs  12/24/16 0400  AST 30  ALT 34  ALKPHOS 138*  BILITOT 0.9  PROT 7.0  ALBUMIN 2.5*   No results for input(s): LIPASE, AMYLASE in the last 72 hours. Cardiac Enzymes  Recent Labs  12/23/16 0424  TROPONINI 0.56*   BNP Invalid input(s): POCBNP D-Dimer No results for input(s): DDIMER in the last 72 hours. Hemoglobin A1C No results for input(s): HGBA1C in the last 72 hours. Fasting Lipid Panel No results for input(s): CHOL, HDL, LDLCALC, TRIG, CHOLHDL, LDLDIRECT in the last 72 hours. Thyroid Function Tests No results for input(s): TSH, T4TOTAL, T3FREE, THYROIDAB in the last 72 hours.  Invalid input(s): FREET3  Weights: Filed Weights    12/21/16 1612 December 21, 2016 2010  Weight: 139 lb (63 kg) 138 lb 14.4 oz (63 kg)     Radiology/Studies:  Dg Chest 1 View  Result Date: 12/20/2016 CLINICAL DATA:  Shortness of breath . EXAM: CHEST 1 VIEW COMPARISON:  12/12/2016 . FINDINGS: Interim placement of NG tube, its tip is below the left hemidiaphragm. Endotracheal tube, right IJ line stable position. Cardiomegaly with bilateral pulmonary alveolar infiltrates consistent with CHF. Bilateral pneumonia cannot be excluded. No pneumothorax. IMPRESSION: 1. Interim placement of NG tube, its tip is below the left hemidiaphragm. Endotracheal tube and right IJ line in stable position. 2. Cardiomegaly with bilateral pulmonary infiltrates consistent pulmonary edema. Bilateral pneumonia cannot be excluded. No change from prior exam . Electronically Signed   By: Maisie Fus  Register   On: 12/20/2016 06:55      Assessment and Plan  76 y.o. female   1. Complete heart block: -Status post venous temp wire on 2/11 for heart block heart rate of 28 with hypotension  atrial tachycardia noted after she was extubated,  rate 116 bpm Started on esmolol infusion yesterday, eventually converted to normal sinus rhythm Esmolol weaned off secondary to bradycardia now sinus rhythm in the 80 range Rate improved with Precedex,  She may need esmolol prn for rate control  2. Acute respiratory failure/PNA requiring intubation: -Likely multifactorial including PNA and acute diastolic CHF, made worse by severe aortic valve stenosis Responding to Lasix, post ATN diuresis Still with significant  pulmonary edema, Lasix IV twice a day started  3. Sepsis with PNA: Last week appeared to have septic picture, hypotensive, complete heart block Required 3 pressors for period Of 24-48 hours Much improved now off pressors  4. Acute diastolic CHF: Continue Lasix IV Post ATN diuresis  5. Acute on chronic renal failure: Improving,followed by nephrology Suspect ATN   6. Severe  aortic stenosis: -Measurements are severe on echocardiogram -Likely contributing to CHF symptoms -Will need to stabilize respiratory status,  If she recovers from current acute respiratory distress, Would need to see if her mental status improves Then would see if she would be a candidate for TAVR, she would need R/LHC prior  7. Anemia: -Unable to to exclude iron deficiency vs anemia of chronic disease -Maintain hgb > 8.5  8. Elevated troponin: -Likely supply demand ischemia 2/2 the above -May require ischemic evaluation pending her recovery     Total encounter time more than 25 minutes  Greater than 50% was spent in counseling and coordination of care with the patient   Signed, Dossie Arbour, MD, Ph.D. [facility] 12/24/2016, 2:18 PM

## 2016-12-25 LAB — GLUCOSE, CAPILLARY
GLUCOSE-CAPILLARY: 121 mg/dL — AB (ref 65–99)
GLUCOSE-CAPILLARY: 105 mg/dL — AB (ref 65–99)
Glucose-Capillary: 184 mg/dL — ABNORMAL HIGH (ref 65–99)
Glucose-Capillary: 141 mg/dL — ABNORMAL HIGH (ref 65–99)
Glucose-Capillary: 129 mg/dL — ABNORMAL HIGH (ref 65–99)

## 2016-12-25 LAB — CBC
HCT: 29.5 % — ABNORMAL LOW (ref 35.0–47.0)
Hemoglobin: 9.9 g/dL — ABNORMAL LOW (ref 12.0–16.0)
MCH: 29.7 pg (ref 26.0–34.0)
MCHC: 33.6 g/dL (ref 32.0–36.0)
MCV: 88.5 fL (ref 80.0–100.0)
PLATELETS: 320 10*3/uL (ref 150–440)
RBC: 3.33 MIL/uL — ABNORMAL LOW (ref 3.80–5.20)
RDW: 15 % — AB (ref 11.5–14.5)
WBC: 12.9 10*3/uL — AB (ref 3.6–11.0)

## 2016-12-25 LAB — BASIC METABOLIC PANEL
ANION GAP: 13 (ref 5–15)
BUN: 41 mg/dL — ABNORMAL HIGH (ref 6–20)
CALCIUM: 9.2 mg/dL (ref 8.9–10.3)
CO2: 40 mmol/L — ABNORMAL HIGH (ref 22–32)
Chloride: 90 mmol/L — ABNORMAL LOW (ref 101–111)
Creatinine, Ser: 1.79 mg/dL — ABNORMAL HIGH (ref 0.44–1.00)
GFR, EST AFRICAN AMERICAN: 31 mL/min — AB (ref >60)
GFR, EST NON AFRICAN AMERICAN: 27 mL/min — AB (ref >60)
Glucose, Bld: 142 mg/dL — ABNORMAL HIGH (ref 65–99)
Potassium: 2.7 mmol/L — CL (ref 3.5–5.1)
SODIUM: 143 mmol/L (ref 135–145)

## 2016-12-25 LAB — VANCOMYCIN, RANDOM: VANCOMYCIN RM: 12

## 2016-12-25 LAB — PHOSPHORUS: PHOSPHORUS: 4.1 mg/dL (ref 2.5–4.6)

## 2016-12-25 LAB — POTASSIUM: Potassium: 3.1 mmol/L — ABNORMAL LOW (ref 3.5–5.1)

## 2016-12-25 LAB — MAGNESIUM: Magnesium: 1.6 mg/dL — ABNORMAL LOW (ref 1.7–2.4)

## 2016-12-25 MED ORDER — MORPHINE SULFATE (PF) 4 MG/ML IV SOLN
2.0000 mg | INTRAVENOUS | Status: DC | PRN
  Administered 2016-12-25 – 2016-12-26 (×7): 2 mg via INTRAVENOUS
  Filled 2016-12-25 (×7): qty 1

## 2016-12-25 MED ORDER — LORAZEPAM 2 MG/ML IJ SOLN
INTRAMUSCULAR | Status: AC
  Filled 2016-12-25: qty 1

## 2016-12-25 MED ORDER — VANCOMYCIN HCL IN DEXTROSE 1-5 GM/200ML-% IV SOLN
1000.0000 mg | INTRAVENOUS | Status: DC
  Administered 2016-12-25 – 2016-12-27 (×2): 1000 mg via INTRAVENOUS
  Filled 2016-12-25 (×2): qty 200

## 2016-12-25 MED ORDER — LORAZEPAM 2 MG/ML IJ SOLN
0.5000 mg | Freq: Once | INTRAMUSCULAR | Status: AC
  Administered 2016-12-25: 0.5 mg via INTRAVENOUS

## 2016-12-25 MED ORDER — LORAZEPAM 2 MG/ML IJ SOLN
0.5000 mg | Freq: Once | INTRAMUSCULAR | Status: AC
  Administered 2016-12-25: 0.5 mg via INTRAVENOUS
  Filled 2016-12-25: qty 1

## 2016-12-25 MED ORDER — SODIUM CHLORIDE 0.9 % IV SOLN
30.0000 meq | Freq: Once | INTRAVENOUS | Status: AC
  Administered 2016-12-25: 30 meq via INTRAVENOUS
  Filled 2016-12-25: qty 15

## 2016-12-25 NOTE — Progress Notes (Signed)
BMP Latest Ref Rng & Units 12/25/2016 12/25/2016 12/24/2016  Glucose 65 - 99 mg/dL - 161(W142(H) 960(A146(H)  BUN 6 - 20 mg/dL - 54(U41(H) 98(J50(H)  Creatinine 0.44 - 1.00 mg/dL - 1.91(Y1.79(H) 7.82(N1.83(H)  Sodium 135 - 145 mmol/L - 143 147(H)  Potassium 3.5 - 5.1 mmol/L 3.1(L) 2.7(LL) 3.6  Chloride 101 - 111 mmol/L - 90(L) 100(L)  CO2 22 - 32 mmol/L - 40(H) 37(H)  Calcium 8.9 - 10.3 mg/dL - 9.2 5.6(O8.7(L)    S Creatinine has improved Will sign off   Please reconsult as necessary

## 2016-12-25 NOTE — Progress Notes (Signed)
Pt confused will no longer keep BIPAP on at this time, placed on HFNC, RN aware, will continue to monitor

## 2016-12-25 NOTE — Progress Notes (Signed)
Lab reports critical Potassium of 2.7.  Dellis AnesMagdeline Tukov, NP made aware.

## 2016-12-25 NOTE — Progress Notes (Signed)
Pharmacy Antibiotic Note  Chloe Sanchez is a 76 y.o. female admitted on 12/17/2016 with pneumonia.  Pharmacy has been consulted for vancomycin and cefepime dosing.  Plan: Patient in ARF with no plans for dialysis per nephrology. Vancomycin random level of 20 mcg/ml followed by 18 mcg/ml 24 hours later. Ke= 0.0044 h-1. T1/2= 157.5 h. Vancomycin level should reach 15 mcg/ml around 0700 2/15. Will check another vancomycin level with AM labs 2/15 and redose from there.    2/15 0424 vancomycin random 11 mcg/mL. Recalculated Ke 0.013 hr-1. SCr has decreased to 2.17 mg/dL. Give vancomycin 1 gm IV x 1 now (estimated Cs = 37 mcg/mL). Will recheck vancomycin random level in approximately 48 hours when predicted level less than 20 mcg/mL  2/17 0900 Vanc Random level: 812mcg/mL. CrCl and Scr improving. Will order Vancomycin 1gm every 48 hours for now. Continue to monitor renal function and adjust dose as needed. Will recheck vancomycin random level as needed.   Will continue cefepime to 1 g iv q 24 hours.   Height: 5\' 2"  (157.5 cm) Weight: 138 lb 14.4 oz (63 kg) IBW/kg (Calculated) : 50.1  Temp (24hrs), Avg:97.9 F (36.6 C), Min:97.6 F (36.4 C), Max:98.3 F (36.8 C)   Recent Labs Lab 2017-11-02 2140  12/20/16 0421  12/21/16 0533  12/22/16 0518 12/23/16 0424 12/24/16 0400 12/25/16 0432 12/25/16 0859  WBC  --   < >  --   --  28.5*  --  33.4* 32.6* 21.5* 12.9*  --   CREATININE  --   < >  --   --  2.91*  --  2.40* 2.17* 1.83* 1.79*  --   LATICACIDVEN 3.9*  --  2.4*  --   --   --   --   --   --   --   --   VANCORANDOM  --   --   --   < >  --   < >  --  11  --   --  12  < > = values in this interval not displayed.  Estimated Creatinine Clearance: 23.7 mL/min (by C-G formula based on SCr of 1.79 mg/dL (H)).    No Known Allergies  Anti-infectives    Start     Dose/Rate Route Frequency Ordered Stop   12/25/16 1200  vancomycin (VANCOCIN) IVPB 1000 mg/200 mL premix     1,000 mg 200 mL/hr over 60  Minutes Intravenous Every 48 hours 12/25/16 1139     12/23/16 0600  vancomycin (VANCOCIN) IVPB 1000 mg/200 mL premix     1,000 mg 200 mL/hr over 60 Minutes Intravenous  Once 12/23/16 0524 12/23/16 0653   12/21/16 2200  ceFEPIme (MAXIPIME) 1 GM / 50mL IVPB premix     1 g 100 mL/hr over 30 Minutes Intravenous Every 24 hours 12/21/16 1458     2017-11-02 2200  ceFEPIme (MAXIPIME) 2 g in dextrose 5 % 50 mL IVPB  Status:  Discontinued     2 g 100 mL/hr over 30 Minutes Intravenous Every 24 hours 2017-11-02 2111 12/21/16 1458   2017-11-02 1800  vancomycin (VANCOCIN) IVPB 1000 mg/200 mL premix  Status:  Discontinued     1,000 mg 200 mL/hr over 60 Minutes Intravenous Every 36 hours 12/18/16 1151 2017-11-02 1458   2017-11-02 1530  vancomycin (VANCOCIN) IVPB 1000 mg/200 mL premix  Status:  Discontinued     1,000 mg 200 mL/hr over 60 Minutes Intravenous Every 36 hours 2017-11-02 1458 12/20/16 1535   12/18/16 2100  piperacillin-tazobactam (ZOSYN) IVPB 3.375 g  Status:  Discontinued     3.375 g 12.5 mL/hr over 240 Minutes Intravenous Every 12 hours 12/18/16 1149 12/11/2016 2103   12/18/16 1800  vancomycin (VANCOCIN) IVPB 1000 mg/200 mL premix  Status:  Discontinued     1,000 mg 200 mL/hr over 60 Minutes Intravenous Every 36 hours 12/18/16 0053 12/18/16 1149   12/18/16 0200  piperacillin-tazobactam (ZOSYN) IVPB 3.375 g  Status:  Discontinued     3.375 g 12.5 mL/hr over 240 Minutes Intravenous Every 8 hours 12/18/16 0049 12/18/16 1149   12/18/16 0200  vancomycin (VANCOCIN) IVPB 1000 mg/200 mL premix     1,000 mg 200 mL/hr over 60 Minutes Intravenous  Once 12/18/16 0053 12/18/16 0301   12/17/16 1800  levofloxacin (LEVAQUIN) tablet 750 mg  Status:  Discontinued     750 mg Oral Every 48 hours 12/17/16 1621 12/18/16 0031   12/17/16 1000  cefTRIAXone (ROCEPHIN) IVPB 1 g  Status:  Discontinued     1 g 100 mL/hr over 30 Minutes Intravenous Every 24 hours 12/14/2016 1853 12/17/16 1600   Dec 29, 2016 1900  cefTRIAXone (ROCEPHIN)  1 g in dextrose 5 % 50 mL IVPB  Status:  Discontinued     1 g 100 mL/hr over 30 Minutes Intravenous Every 24 hours 12/15/2016 1849 12/20/2016 1853   01/01/2017 1900  azithromycin (ZITHROMAX) 250 mg in dextrose 5 % 125 mL IVPB  Status:  Discontinued     250 mg 125 mL/hr over 60 Minutes Intravenous Every 24 hours 12/11/2016 1849 12/17/16 1600   Dec 29, 2016 1800  cefTRIAXone (ROCEPHIN) IVPB 1 g     1 g 100 mL/hr over 30 Minutes Intravenous  Once 12/31/2016 1746 12/20/2016 2316   12/14/2016 1745  cefTRIAXone (ROCEPHIN) 1 g in dextrose 5 % 50 mL IVPB  Status:  Discontinued     1 g 100 mL/hr over 30 Minutes Intravenous  Once 29-Dec-2016 1742 December 29, 2016 1746   29-Dec-2016 1745  azithromycin (ZITHROMAX) 500 mg in dextrose 5 % 250 mL IVPB     500 mg 250 mL/hr over 60 Minutes Intravenous  Once 12/15/2016 1742 12/12/2016 1907     Recent Results (from the past 240 hour(s))  MRSA PCR Screening     Status: Abnormal   Collection Time: 12/17/2016 10:51 PM  Result Value Ref Range Status   MRSA by PCR POSITIVE (A) NEGATIVE Final    Comment:        The GeneXpert MRSA Assay (FDA approved for NASAL specimens only), is one component of a comprehensive MRSA colonization surveillance program. It is not intended to diagnose MRSA infection nor to guide or monitor treatment for MRSA infections. RESULT CALLED TO, READ BACK BY AND VERIFIED WITH: CYNTHIA BURGESS @ 0021 ON 12/17/2016 BY CAF   Culture, blood (Routine X 2) w Reflex to ID Panel     Status: None   Collection Time: 01/03/2017  1:43 PM  Result Value Ref Range Status   Specimen Description BLOOD RIGHT ARM  Final   Special Requests   Final    BOTTLES DRAWN AEROBIC AND ANAEROBIC AER 6CC ANA 4CC   Culture NO GROWTH 5 DAYS  Final   Report Status 12/24/2016 FINAL  Final  Culture, blood (Routine X 2) w Reflex to ID Panel     Status: None   Collection Time: 01/04/2017  1:43 PM  Result Value Ref Range Status   Specimen Description BLOOD LEFT HAND  Final   Special Requests   Final  BOTTLES DRAWN AEROBIC AND ANAEROBIC AER 6CC ANA 7CC   Culture NO GROWTH 5 DAYS  Final   Report Status 12/24/2016 FINAL  Final  Urine culture     Status: None   Collection Time: 2017-01-02  1:50 PM  Result Value Ref Range Status   Specimen Description URINE, RANDOM  Final   Special Requests NONE  Final   Culture   Final    NO GROWTH Performed at [facility], [address], [place], [place] [address]    Report Status 12/21/2016 FINAL  Final  Culture, respiratory (NON-Expectorated)     Status: None   Collection Time: 12/20/16  3:51 PM  Result Value Ref Range Status   Specimen Description TRACHEAL ASPIRATE  Final   Special Requests NONE  Final   Gram Stain   Final    ABUNDANT WBC PRESENT,BOTH PMN AND MONONUCLEAR FEW YEAST Performed at [facility], [address], [place], [place] [address]    Culture FEW Mckinley Jewel  Final   Report Status 12/23/2016 FINAL  Final    Thank you for allowing pharmacy to be a part of this patient's care.  Ceferino Lang M Duwayne Matters, Pharm.D., BCPS Clinical Pharmacist 12/25/2016 11:39 AM

## 2016-12-25 NOTE — Progress Notes (Signed)
[facility] Consultation    Name: Teressa Lowerdith O Mahoney MRN: [id number] DOB: Nov 18, 1940    ADMISSION DATE:  12/23/2016 CONSULTATION DATE:  01/03/2017  REFERRING MD : Dr. Imogene Burnhen  SUBJ:   No significant difficulties overnight. Patient still confused requiring Precedex infusion. Has remained in sinus rhythm not requiring any additional esmolol.  VITAL SIGNS: Temp:  [97.6 F (36.4 C)-98.3 F (36.8 C)] 97.6 F (36.4 C) (02/16 2000) Pulse Rate:  [67-98] 67 (02/17 0600) Resp:  [16-25] 18 (02/17 0600) BP: (98-151)/(54-75) 126/58 (02/17 0600) SpO2:  [85 %-100 %] 99 % (02/17 0600) FiO2 (%):  [60 %-100 %] 60 % (02/17 0140) HEMODYNAMICS:   VENTILATOR SETTINGS: FiO2 (%):  [60 %-100 %] 60 % INTAKE / OUTPUT:  Intake/Output Summary (Last 24 hours) at 12/25/16 0754 Last data filed at 12/25/16 0400  Gross per 24 hour  Intake           785.39 ml  Output             3450 ml  Net         -2664.61 ml    Physical Examination:  Vitals:   12/25/16 0300 12/25/16 0400 12/25/16 0500 12/25/16 0600  BP: 136/72 139/72 129/67 (!) 126/58  Pulse: 86 81 82 67  Resp: 20 16 17 18   Temp:      TempSrc:      SpO2: 100% 99% 98% 99%  Weight:      Height:       RASS+2, not F/C HEENT WNL JVP not well visualized Scattered rales and rhonchi, no wheezes Tachy, reg, + syst M Abd soft, +BS Ext warm, no edema No focal neuro deficits  LABS: Reviewed   LABORATORY PANEL:   CBC  Recent Labs Lab 12/25/16 0432  WBC 12.9*  HGB 9.9*  HCT 29.5*  PLT 320    Chemistries   Recent Labs Lab 12/24/16 0400 12/25/16 0432  NA 147* 143  K 3.6 2.7*  CL 100* 90*  CO2 37* 40*  GLUCOSE 146* 142*  BUN 50* 41*  CREATININE 1.83* 1.79*  CALCIUM 8.7* 9.2  MG  --  1.6*  PHOS  --  4.1  AST 30  --   ALT 34  --   ALKPHOS 138*  --   BILITOT 0.9  --      Recent Labs Lab 12/24/16 1140 12/24/16 1635 12/24/16 1928 12/24/16 2328 12/25/16 0403 12/25/16 0722  GLUCAP 176* 148* 118* 146*  121* 105*    Recent Labs Lab 01/05/2017 2036 12/18/2016 2350 12/20/16 0421  PHART 7.01* 7.17* 7.28*  PCO2ART 59* 52* 54*  PO2ART 225* 128* 163*    Recent Labs Lab 12/26/2016 2351 12/21/16 0533 12/24/16 0400  AST 136* 65* 30  ALT 74* 59* 34  ALKPHOS 152* 121 138*  BILITOT 0.8 0.7 0.9  ALBUMIN 2.5* 2.3* 2.5*    Cardiac Enzymes  Recent Labs Lab 12/23/16 0424  TROPONINI 0.56*   PCT 2.34 >59.38> 79.11  RADIOLOGY:  CXR: improved edema pattern   ASSESSMENT/PLAN   76 year old female with acute respiratory failure, with acute aspiration pneumonitis/pneumonia with severe pulmonary edema. 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  1) Acute hypoxic respiratory failure, Patient still requiring continuous BiPAP. 2) Extensive bilateral pulmonary infiltrates pulmonary edema secondary to severe aortic stenosis 3) Possible aspiration PNA, markedly elevated PCT (improving) on cefepime 4) Critical aortic stenosis. If underlying physiology improves will need to have TAVR 5) CHB, resolved, presently  in sinus rhythm, no additional esmolol required 6) hypokalemia, presently being replaced will repeat BMP post 7) AKI - slight improvement predominantly prerenal indices most likely secondary to decreased effective renal plasma flow from tight aortic stenosis 8) Severe agitated encephalopathy/delirium requiring Precedex infusion, will try to wean as tolerated  CCM time: 35 mins The above time includes time spent in consultation with patient and/or family members and reviewing care plan on multidisciplinary rounds   Tora Kindred, DO PCCM Service 12/25/2016

## 2016-12-26 LAB — GLUCOSE, CAPILLARY
GLUCOSE-CAPILLARY: 100 mg/dL — AB (ref 65–99)
GLUCOSE-CAPILLARY: 129 mg/dL — AB (ref 65–99)
GLUCOSE-CAPILLARY: 152 mg/dL — AB (ref 65–99)
GLUCOSE-CAPILLARY: 153 mg/dL — AB (ref 65–99)
GLUCOSE-CAPILLARY: 71 mg/dL (ref 65–99)
GLUCOSE-CAPILLARY: 97 mg/dL (ref 65–99)
Glucose-Capillary: 134 mg/dL — ABNORMAL HIGH (ref 65–99)

## 2016-12-26 LAB — BASIC METABOLIC PANEL
ANION GAP: 13 (ref 5–15)
BUN: 39 mg/dL — ABNORMAL HIGH (ref 6–20)
CHLORIDE: 89 mmol/L — AB (ref 101–111)
CO2: 39 mmol/L — AB (ref 22–32)
Calcium: 8.9 mg/dL (ref 8.9–10.3)
Creatinine, Ser: 1.77 mg/dL — ABNORMAL HIGH (ref 0.44–1.00)
GFR calc non Af Amer: 27 mL/min — ABNORMAL LOW (ref >60)
GFR, EST AFRICAN AMERICAN: 31 mL/min — AB (ref >60)
Glucose, Bld: 177 mg/dL — ABNORMAL HIGH (ref 65–99)
POTASSIUM: 2.9 mmol/L — AB (ref 3.5–5.1)
SODIUM: 141 mmol/L (ref 135–145)

## 2016-12-26 LAB — MAGNESIUM: Magnesium: 1.4 mg/dL — ABNORMAL LOW (ref 1.7–2.4)

## 2016-12-26 LAB — PHOSPHORUS: PHOSPHORUS: 3.9 mg/dL (ref 2.5–4.6)

## 2016-12-26 MED ORDER — FENTANYL CITRATE (PF) 100 MCG/2ML IJ SOLN
25.0000 ug | INTRAMUSCULAR | Status: DC | PRN
  Administered 2016-12-26 – 2016-12-27 (×4): 50 ug via INTRAVENOUS
  Filled 2016-12-26 (×5): qty 2

## 2016-12-26 MED ORDER — LORAZEPAM 2 MG/ML IJ SOLN
0.5000 mg | Freq: Once | INTRAMUSCULAR | Status: AC
  Administered 2016-12-26: 0.5 mg via INTRAVENOUS
  Filled 2016-12-26: qty 1

## 2016-12-26 MED ORDER — SODIUM CHLORIDE 0.9 % IV SOLN: Freq: Once | INTRAVENOUS | Status: DC

## 2016-12-26 MED ORDER — MORPHINE SULFATE (PF) 4 MG/ML IV SOLN
2.0000 mg | INTRAVENOUS | Status: AC | PRN
  Administered 2016-12-26: 2 mg via INTRAVENOUS
  Administered 2016-12-27 – 2016-12-28 (×11): 4 mg via INTRAVENOUS
  Filled 2016-12-26 (×12): qty 1

## 2016-12-26 MED ORDER — SODIUM CHLORIDE 0.9 % IV SOLN
30.0000 meq | Freq: Once | INTRAVENOUS | Status: DC
  Filled 2016-12-26: qty 15

## 2016-12-26 MED ORDER — MAGNESIUM SULFATE 4 GM/100ML IV SOLN
4.0000 g | Freq: Once | INTRAVENOUS | Status: AC
  Administered 2016-12-26: 4 g via INTRAVENOUS
  Filled 2016-12-26: qty 100

## 2016-12-26 MED ORDER — SODIUM CHLORIDE 0.9 % IV SOLN
Freq: Once | INTRAVENOUS | Status: AC
  Administered 2016-12-26: 07:00:00 via INTRAVENOUS
  Filled 2016-12-26: qty 1000

## 2016-12-26 NOTE — Progress Notes (Signed)
Pt had several episodes of increased confusion with agitation. Had to be medicated with fentanyl and morphine in addition to the Precedex gtt. Family has been at bedside this afternoon. When pt is awake she does ask questions in reference to why she is here, and when can she leave. Continue to monitor.

## 2016-12-26 NOTE — Progress Notes (Signed)
Pt has intermittent episodes of agitation and restlessness in which unable to redirect patient. The agitation and restlessness are  alleviated with morphine.  Order for Recruitment consultantsafety sitter per Maude LericheMagdalene Tukov NP.  NSR with 1st degree Heart block, requiring 65% HFNC to maintain O2 saturations.  Remains NPO.  Adequate urine output from foley, vital signs stable, afebrile.  Potassium and magnesium currently being replaced via IV route.
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
## 2016-12-26 NOTE — Progress Notes (Signed)
Patient: Chloe Sanchez / Admit Date: 12/18/2016 / Date of Encounter: 12/26/2016, 1:19 PM   Hospital Problem List     Active Problems:   Pneumonia   Complete heart block (HCC)   Acute renal failure syndrome (HCC)   Acute chronic obstructive pulmonary disease with respiratory distress (HCC)   Chronic kidney disease, stage III (moderate)   Cough   Dyspnea   Generalized weakness   Pleural effusion   Anemia in chronic kidney disease   Hypotension   Bradycardia   Respiratory failure (HCC)   Sepsis (HCC)   Seizure-like activity (HCC)   Severe aortic valve stenosis   Atrial tachycardia (HCC)     Subjective   Continues to have periods of agitation, better with Precedex On high flow O2 Diuresis with 8.3L out  Inpatient Medications    Scheduled Meds: . aspirin  300 mg Rectal Daily  . ceFEPIme  1 g Intravenous Q24H  . dextrose  1 ampule Intravenous Once  . famotidine (PEPCID) IV  20 mg Intravenous Q24H  . fentaNYL  12.5 mcg Transdermal Q72H  . furosemide  40 mg Intravenous Q12H  . heparin  5,000 Units Subcutaneous Q8H  . insulin aspart  0-9 Units Subcutaneous Q4H  . ipratropium-albuterol  3 mL Nebulization Q6H  . levothyroxine  50 mcg Intravenous Daily  . mupirocin ointment  1 application Nasal BID  . sodium chloride flush  10-40 mL Intracatheter Q12H  . vancomycin  1,000 mg Intravenous Q48H   Continuous Infusions: . dexmedetomidine 1.2 mcg/kg/hr (12/26/16 1302)  . dextrose 50 mL/hr at 12/26/16 0600  . esmolol Stopped (12/24/16 1355)   PRN Meds: acetaminophen, fentaNYL (SUBLIMAZE) injection, lip balm, morphine injection   Objective: Telemetry: Sinus rhythm rate in the 80-90 range Physical Exam: Blood pressure (!) 119/52, pulse 70, temperature 97.5 F (36.4 C), temperature source Axillary, resp. rate 17, height 5\' 2"  (1.575 m), weight 138 lb 14.4 oz (63 kg), SpO2 97 %. Body mass index is 25.41 kg/m. GEN:on BiPAP, currently sleeping HEENT:Grossly normal.    Neck:Supple, no JVD, carotid bruits, or masses.  Cardiac:RRR, III/VI systolic murmur RUSB radiating to the neck, no rubs, or gallops. No clubbing, cyanosis, edema. Radials/DP/PT 2+ and equal bilaterally.  Respiratory:Decreased breath sounds bilaterally with scattered rhonchi. ZO:XWRU, nontender, nondistended, BS + x 4. MS:No deformity or atrophy. Skin:Cool and dry, no rash. Neuro:Unable to perform as patient is sedated  Psych: not conversant, sleeping   Intake/Output Summary (Last 24 hours) at 12/26/16 1319 Last data filed at 12/26/16 1200  Gross per 24 hour  Intake           1684.7 ml  Output             1700 ml  Net            -15.3 ml     Labs: CBC  Recent Labs  12/24/16 0400 12/25/16 0432  WBC 21.5* 12.9*  HGB 9.1* 9.9*  HCT 27.0* 29.5*  MCV 89.2 88.5  PLT 300 320   Basic Metabolic Panel  Recent Labs  12/25/16 0432 12/25/16 1417 12/26/16 0506  NA 143  --  141  K 2.7* 3.1* 2.9*  CL 90*  --  89*  CO2 40*  --  39*  GLUCOSE 142*  --  177*  BUN 41*  --  39*  CREATININE 1.79*  --  1.77*  CALCIUM 9.2  --  8.9  MG 1.6*  --  1.4*  PHOS 4.1  --  3.9   Liver Function Tests  Recent Labs  12/24/16 0400  AST 30  ALT 34  ALKPHOS 138*  BILITOT 0.9  PROT 7.0  ALBUMIN 2.5*   No results for input(s): LIPASE, AMYLASE in the last 72 hours. Cardiac Enzymes No results for input(s): CKTOTAL, CKMB, CKMBINDEX, TROPONINI in the last 72 hours. BNP Invalid input(s): POCBNP D-Dimer No results for input(s): DDIMER in the last 72 hours. Hemoglobin A1C No results for input(s): HGBA1C in the last 72 hours. Fasting Lipid Panel No results for input(s): CHOL, HDL, LDLCALC, TRIG, CHOLHDL, LDLDIRECT in the last 72 hours. Thyroid Function Tests No results for input(s): TSH, T4TOTAL, T3FREE, THYROIDAB in the last 72 hours.  Invalid input(s): FREET3  Weights: Filed Weights   03-22-2017 1612 03-22-2017 2010  Weight: 139 lb (63 kg) 138 lb 14.4 oz (63 kg)      Radiology/Studies:  Dg Chest 1 View  Result Date: 12/20/2016 CLINICAL DATA:  Shortness of breath . EXAM: CHEST 1 VIEW COMPARISON:  12/25/2016 . FINDINGS: Interim placement of NG tube, its tip is below the left hemidiaphragm. Endotracheal tube, right IJ line stable position. Cardiomegaly with bilateral pulmonary alveolar infiltrates consistent with CHF. Bilateral pneumonia cannot be excluded. No pneumothorax. IMPRESSION: 1. Interim placement of NG tube, its tip is below the left hemidiaphragm. Endotracheal tube and right IJ line in stable position. 2. Cardiomegaly with bilateral pulmonary infiltrates consistent pulmonary edema. Bilateral pneumonia cannot be excluded. No change from prior exam . Electronically Signed   By: Maisie Fushomas  Register   On: 12/20/2016 06:55      Assessment and Plan  76 y.o. female   1. Complete heart block: -Status post venous temp wire on 2/11 for heart block heart rate of 28 with hypotension  atrial tachycardia noted after she was extubated,  rate 116 bpm Started on esmolol infusion, eventually converted to normal sinus rhythm Esmolol weaned off secondary to bradycardia now sinus rhythm in the 80 range Rate improved with Precedex,  She may need esmolol prn for rate control  2. Acute respiratory failure/PNA requiring intubation: -Likely multifactorial including PNA and acute diastolic CHF, made worse by severe aortic valve stenosis Responding to Lasix, post ATN diuresis Still with significant  pulmonary edema, Lasix IV twice a day started  3. Sepsis with PNA: Last week appeared to have septic picture, hypotensive, complete heart block Required 3 pressors for period Of 24-48 hours Much improved now off pressors  4. Acute diastolic CHF: Continue gentle Lasix IV 8.3L out Creatinine improving with diuresis and improving overall status  5. Acute on chronic renal failure: Improving,followed by nephrology Suspect ATN   6. Severe aortic  stenosis: -Measurements are severe on echocardiogram -Likely contributing to CHF symptoms -Ramiel Forti need to stabilize respiratory status,  If she recovers from current acute respiratory distress, Would need to see if her mental status improves Then would see if she would be a candidate for TAVR, she would need R/LHC prior  7. Anemia: -Unable to to exclude iron deficiency vs anemia of chronic disease -Maintain hgb > 8.5  8. Elevated troponin: -Likely supply demand ischemia 2/2 the above -May require ischemic evaluation pending her recovery    Signed, Chloe BrooklynWill Bland Rudzinski, MD [facility] 12/26/2016, 1:19 PM

## 2016-12-26 NOTE — Progress Notes (Signed)
Late entry: Pt very agitated, restless, and pulling a medical devices. Asked MD to something more than fentanyl since it did not help pt calm down and the Precedex is at max at 1.2 mcg. Order for 1 time dose of ativan .5 mg IV.
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
## 2016-12-26 NOTE — Progress Notes (Signed)
Pharmacy Antibiotic Note  Chloe Sanchez is a 76 y.o. female admitted on 12/11/2016 with pneumonia.  Pharmacy has been consulted for vancomycin and cefepime dosing.  Plan: Continue cefepime 1g IV Q24hr.   Continue vancomycin 1g IV Q48hr. Will obtian follow up level as clinically indicated.   Height: 5\' 2"  (157.5 cm) Weight: 138 lb 14.4 oz (63 kg) IBW/kg (Calculated) : 50.1  Temp (24hrs), Avg:97.8 F (36.6 C), Min:97.5 F (36.4 C), Max:98.3 F (36.8 C)   Recent Labs Lab 01/04/2017 2140  12/20/16 0421  12/21/16 0533  12/22/16 0518 12/23/16 0424 12/24/16 0400 12/25/16 0432 12/25/16 0859 12/26/16 0506  WBC  --   < >  --   --  28.5*  --  33.4* 32.6* 21.5* 12.9*  --   --   CREATININE  --   < >  --   --  2.91*  --  2.40* 2.17* 1.83* 1.79*  --  1.77*  LATICACIDVEN 3.9*  --  2.4*  --   --   --   --   --   --   --   --   --   VANCORANDOM  --   --   --   < >  --   < >  --  11  --   --  12  --   < > = values in this interval not displayed.  Estimated Creatinine Clearance: 24 mL/min (by C-G formula based on SCr of 1.77 mg/dL (H)).    No Known Allergies  Antiinfectives:   Azithromycin 2/8 >> 2/8 Ceftriaxone 2/9 >> 2/10 Levofloxacin 2/9 >> 2/9  Zosyn 2/10 >>2/11 Cefepime 2/11 >> Vancomycin 2/11 >>   Results:   2/11 BCx: no growth x 5 days 2/12 Respiratory Cx: Few Candida Glabrata  2/11 UCx: no growth  2/8  MRSA PCR: positive   Pharmacy will continue to monitor and adjust per consult.    Kenyatte Gruber L 12/26/2016 7:49 AM

## 2016-12-27 ENCOUNTER — Inpatient Hospital Stay: Payer: Medicare HMO

## 2016-12-27 LAB — BASIC METABOLIC PANEL
Anion gap: 9 (ref 5–15)
BUN: 35 mg/dL — ABNORMAL HIGH (ref 6–20)
CALCIUM: 8.7 mg/dL — AB (ref 8.9–10.3)
CO2: 37 mmol/L — AB (ref 22–32)
CREATININE: 1.74 mg/dL — AB (ref 0.44–1.00)
Chloride: 93 mmol/L — ABNORMAL LOW (ref 101–111)
GFR calc Af Amer: 32 mL/min — ABNORMAL LOW (ref >60)
GFR, EST NON AFRICAN AMERICAN: 27 mL/min — AB (ref >60)
GLUCOSE: 124 mg/dL — AB (ref 65–99)
Potassium: 3.4 mmol/L — ABNORMAL LOW (ref 3.5–5.1)
Sodium: 139 mmol/L (ref 135–145)

## 2016-12-27 LAB — MAGNESIUM: MAGNESIUM: 2.4 mg/dL (ref 1.7–2.4)

## 2016-12-27 LAB — CBC
HCT: 27.1 % — ABNORMAL LOW (ref 35.0–47.0)
Hemoglobin: 8.8 g/dL — ABNORMAL LOW (ref 12.0–16.0)
MCH: 29.4 pg (ref 26.0–34.0)
MCHC: 32.5 g/dL (ref 32.0–36.0)
MCV: 90.6 fL (ref 80.0–100.0)
PLATELETS: 291 10*3/uL (ref 150–440)
RBC: 2.99 MIL/uL — ABNORMAL LOW (ref 3.80–5.20)
RDW: 14.3 % (ref 11.5–14.5)
WBC: 12.5 10*3/uL — ABNORMAL HIGH (ref 3.6–11.0)

## 2016-12-27 LAB — GLUCOSE, CAPILLARY
GLUCOSE-CAPILLARY: 82 mg/dL (ref 65–99)
Glucose-Capillary: 105 mg/dL — ABNORMAL HIGH (ref 65–99)
Glucose-Capillary: 126 mg/dL — ABNORMAL HIGH (ref 65–99)
Glucose-Capillary: 121 mg/dL — ABNORMAL HIGH (ref 65–99)
Glucose-Capillary: 139 mg/dL — ABNORMAL HIGH (ref 65–99)
Glucose-Capillary: 156 mg/dL — ABNORMAL HIGH (ref 65–99)

## 2016-12-27 MED ORDER — MIDAZOLAM HCL 2 MG/2ML IJ SOLN
INTRAMUSCULAR | Status: AC
  Administered 2016-12-27: 2 mg via INTRAVENOUS
  Filled 2016-12-27: qty 2

## 2016-12-27 MED ORDER — MIDAZOLAM HCL 2 MG/2ML IJ SOLN
INTRAMUSCULAR | Status: AC
  Administered 2016-12-27: 2 mg
  Filled 2016-12-27: qty 2

## 2016-12-27 MED ORDER — MIDAZOLAM HCL 2 MG/2ML IJ SOLN: 2.0000 mg | Freq: Once | INTRAMUSCULAR | Status: AC

## 2016-12-27 MED ORDER — MIDAZOLAM HCL 2 MG/2ML IJ SOLN
2.0000 mg | INTRAMUSCULAR | Status: DC | PRN
Start: 2016-12-27 — End: 2016-12-29
  Administered 2016-12-27 – 2016-12-29 (×28): 2 mg via INTRAVENOUS
  Filled 2016-12-27 (×27): qty 2

## 2016-12-27 MED ORDER — DEXTROSE 5 % IV SOLN
INTRAVENOUS | Status: DC
  Administered 2016-12-27 – 2016-12-28 (×2): via INTRAVENOUS
  Filled 2016-12-27 (×3): qty 1000

## 2016-12-27 NOTE — Progress Notes (Signed)
Pharmacy Antibiotic Note  Chloe Sanchez is a 76 y.o. female admitted on 12/25/2016 with pneumonia.  Pharmacy has been consulted for vancomycin and cefepime dosing.  Plan: Continue cefepime 1g IV Q24hr.   Continue vancomycin 1g IV Q48hr. Will obtian follow up level as clinically indicated.   Height: 5\' 2"  (157.5 cm) Weight: 138 lb 14.4 oz (63 kg) IBW/kg (Calculated) : 50.1  Temp (24hrs), Avg:97.8 F (36.6 C), Min:97.5 F (36.4 C), Max:98 F (36.7 C)   Recent Labs Lab 12/22/16 0518 12/23/16 0424 12/24/16 0400 12/25/16 0432 12/25/16 0859 12/26/16 0506 12/27/16 0445  WBC 33.4* 32.6* 21.5* 12.9*  --   --  12.5*  CREATININE 2.40* 2.17* 1.83* 1.79*  --  1.77* 1.74*  VANCORANDOM  --  11  --   --  12  --   --     Estimated Creatinine Clearance: 24.4 mL/min (by C-G formula based on SCr of 1.74 mg/dL (H)).    No Known Allergies  Antiinfectives:   Azithromycin 2/8 >> 2/8 Ceftriaxone 2/9 >> 2/10 Levofloxacin 2/9 >> 2/9  Zosyn 2/10 >>2/11 Cefepime 2/11 >> Vancomycin 2/11 >>   Results:   2/11 BCx: no growth x 5 days 2/12 Respiratory Cx: Few Candida Glabrata  2/11 UCx: no growth  2/8  MRSA PCR: positive   Pharmacy will continue to monitor and adjust per consult.    Simpson,Michael L 12/27/2016 8:21 PM

## 2016-12-27 NOTE — Progress Notes (Signed)
[facility]    Name: Chloe Sanchez MRN: [id number] DOB: January 21, 1941    ADMISSION DATE:  12/15/2016 CONSULTATION DATE:  12/18/2016  REFERRING MD : Dr. Othella Boyerhen  SUBJ:   Patient with severe resp failure on high flow [place] 75% Remains very anxious, unable to complete ROS Versed given for anxiety  VITAL SIGNS: Temp:  [97.5 F (36.4 C)-98.1 F (36.7 C)] 97.5 F (36.4 C) (02/19 0730) Pulse Rate:  [67-108] 77 (02/19 1000) Resp:  [14-20] 19 (02/19 1000) BP: (103-151)/(45-77) 103/55 (02/19 1000) SpO2:  [76 %-100 %] 100 % (02/19 1000) FiO2 (%):  [65 %] 65 % (02/19 0730) HEMODYNAMICS:   VENTILATOR SETTINGS: FiO2 (%):  [65 %] 65 % INTAKE / OUTPUT:  Intake/Output Summary (Last 24 hours) at 12/27/16 1045 Last data filed at 12/27/16 1026  Gross per 24 hour  Intake          1611.35 ml  Output              900 ml  Net           711.35 ml    Physical Examination:  Vitals:   12/27/16 0730 12/27/16 0910 12/27/16 0930 12/27/16 1000  BP:  (!) 151/77 110/63 (!) 103/55  Pulse:  (!) 101 83 77  Resp:  18 20 19   Temp: 97.5 F (36.4 C)     TempSrc: Axillary     SpO2:  98% 100% 100%  Weight:      Height:       Lethargic, on high flow [place] HEENT WNL JVP not well visualized Scattered rales and rhonchi, no wheezes Tachy, reg, + syst M Abd soft, +BS Ext warm, no edema No focal neuro deficits  LABS: Reviewed   LABORATORY PANEL:   CBC  Recent Labs Lab 12/27/16 0445  WBC 12.5*  HGB 8.8*  HCT 27.1*  PLT 291    Chemistries   Recent Labs Lab 12/24/16 0400  12/26/16 0506 12/27/16 0445  NA 147*  < > 141 139  K 3.6  < > 2.9* 3.4*  CL 100*  < > 89* 93*  CO2 37*  < > 39* 37*  GLUCOSE 146*  < > 177* 124*  BUN 50*  < > 39* 35*  CREATININE 1.83*  < > 1.77* 1.74*  CALCIUM 8.7*  < > 8.9 8.7*  MG  --   < > 1.4* 2.4  PHOS  --   < > 3.9  --   AST 30  --   --   --   ALT 34  --   --   --   ALKPHOS 138*  --   --   --   BILITOT 0.9  --   --   --   <  > = values in this interval not displayed.   Recent Labs Lab 12/26/16 1152 12/26/16 1557 12/26/16 1934 12/26/16 2351 12/27/16 0423 12/27/16 0726  GLUCAP 97 153* 100* 134* 105* 126*   No results for input(s): PHART, PCO2ART, PO2ART in the last 168 hours.  Recent Labs Lab 12/21/16 0533 12/24/16 0400  AST 65* 30  ALT 59* 34  ALKPHOS 121 138*  BILITOT 0.7 0.9  ALBUMIN 2.3* 2.5*    Cardiac Enzymes  Recent Labs Lab 12/23/16 0424  TROPONINI 0.56*   PCT 2.34 >59.38> 79.11  RADIOLOGY:  CXR: improved edema pattern   ASSESSMENT/PLAN   76 year old female with severe and acute respiratory failure, with acute aspiration pneumonitis/pneumonia with severe  pulmonary edema. 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  1) Acute hypoxic respiratory failure-on high flow [place] at 75% 2) Extensive bilateral pulmonary infiltrates pulmonary edema secondary to severe aortic stenosis.continue lasix 3) Possible aspiration PNA 4) Critical aortic stenosis. If underlying physiology improves will need to have TAVR 5) CHB, resolved, presently in sinus rhythm, no additional esmolol required 6) hypokalemia, decreased 2.9 this morning we'll replace 7) AKI -  predominantly prerenal indices most likely secondary to decreased effective renal plasma flow from tight aortic stenosis 8) Severe agitated encephalopathy/delirium requiring Precedex infusion, will try to wean as tolerated  Overall prognosis is very poor, Son Buddy updated this AM I recommend Palliative care consult, will need to consider hospice   Critical Care Time devoted to patient care services described in this note is 37 minutes.   Overall, patient is critically ill, prognosis is guarded.  Patient with Multiorgan failure and at high risk for cardiac arrest and death.    Lucie Leather, M.D.  [facility]  Medical Director [facility] Medical  Director [facility] Cardio-Pulmonary Department

## 2016-12-27 NOTE — Progress Notes (Signed)
Nutrition Follow-up  DOCUMENTATION CODES:   Not applicable  INTERVENTION:  -Discussed nutrition poc during ICU rounds with MD Kasa; pt with inadequate nutrition since at least 2/11 (NPO since 2/11), Per MD, plan to attempt reinsertion of NG/small bore feeding tube today for initiation of TF -If NG placement successful, recommend starting Jevity 1.2 at rate of 20 ml/hr with goal of 60 ml/hr (as previously recommended) -If unable place NG tube today, recommend consideration of initiation of TPN. Continue to assess -Recommend checking electrolytes as pt at risk for refeeding syndrome with initiation of nutrition support  NUTRITION DIAGNOSIS:   Inadequate oral intake related to acute illness as evidenced by NPO status.  Continues  GOAL:   Provide needs based on ASPEN/SCCM guidelines  MONITOR:   Vent status, Labs, Weight trends  REASON FOR ASSESSMENT:   Ventilator    ASSESSMENT:    76 yo female admitted with complete heart block, acute respiratory failure with pneumonia and CHF, acute on CKD IV  Pt remains confused, on precedex, continues on HFNC, pt with 1:1 sitter Dobhoff unable to be placed successfully on 2/16, TF never started Pt has been NPO since 2/11 (8 days) Labs: potassium 3.4, Creatinine 1.74 Meds: D5 at 50 ml/hr, lasix, precedex  Diet Order:   NPO  Skin:  Reviewed, no issues  Last BM:  12/23/16  Height:   Ht Readings from Last 1 Encounters:  12/15/2016 5\' 2"  (1.575 m)    Weight:   Wt Readings from Last 1 Encounters:  12/15/2016 138 lb 14.4 oz (63 kg)    Filed Weights   12/22/2016 1612 12/24/2016 2010  Weight: 139 lb (63 kg) 138 lb 14.4 oz (63 kg)  '  BMI:  Body mass index is 25.41 kg/m.  Estimated Nutritional Needs:   Kcal:  1575-1800 kcals  Protein:  75-90 g  Fluid:  >/= 1.5 L  EDUCATION NEEDS:   No education needs identified at this time  Romelle StarcherCate Peighton Mehra MS, RD, LDN [phone] Pager  [phone] Weekend/On-Call Pager

## 2016-12-27 NOTE — Progress Notes (Signed)
Patient Name: Chloe Sanchez Date of Encounter: 12/27/2016  Primary Cardiologist: New to [facility] - consult by [facility] Problem List     Active Problems:   Pneumonia   Complete heart block (HCC)   Acute renal failure syndrome (HCC)   Acute chronic obstructive pulmonary disease with respiratory distress (HCC)   Chronic kidney disease, stage III (moderate)   Cough   Dyspnea   Generalized weakness   Pleural effusion   Anemia in chronic kidney disease   Hypotension   Bradycardia   Respiratory failure (HCC)   Sepsis (HCC)   Seizure-like activity (HCC)   Severe aortic valve stenosis   Atrial tachycardia (HCC)     Subjective   Still anxious and agitated, trying to get out of bed. BP soft in the 70's systolic with a MAP of 71. Back in what appears to be atrial tachycardia with rates in the 120's bpm this morning.   Inpatient Medications    Scheduled Meds: . aspirin  300 mg Rectal Daily  . ceFEPIme  1 g Intravenous Q24H  . dextrose  1 ampule Intravenous Once  . famotidine (PEPCID) IV  20 mg Intravenous Q24H  . fentaNYL  12.5 mcg Transdermal Q72H  . furosemide  40 mg Intravenous Q12H  . heparin  5,000 Units Subcutaneous Q8H  . insulin aspart  0-9 Units Subcutaneous Q4H  . ipratropium-albuterol  3 mL Nebulization Q6H  . levothyroxine  50 mcg Intravenous Daily  . mupirocin ointment  1 application Nasal BID  . sodium chloride flush  10-40 mL Intracatheter Q12H  . vancomycin  1,000 mg Intravenous Q48H   Continuous Infusions: . dexmedetomidine 1.2 mcg/kg/hr (12/27/16 1200)  . dextrose 5 % 1,000 mL with potassium chloride 40 mEq infusion 50 mL/hr at 12/27/16 1200  . dextrose Stopped (12/27/16 1020)  . esmolol Stopped (12/24/16 1355)   PRN Meds: acetaminophen, fentaNYL (SUBLIMAZE) injection, lip balm, morphine injection   Vital Signs    Vitals:   12/27/16 0930 12/27/16 1000 12/27/16 1100 12/27/16 1200  BP: 110/63 (!) 103/55 109/87   Pulse: 83 77 (!) 123   Resp:  20 19 17    Temp:    97.9 F (36.6 C)  TempSrc:    Axillary  SpO2: 100% 100% (!) 84%   Weight:      Height:        Intake/Output Summary (Last 24 hours) at 12/27/16 1228 Last data filed at 12/27/16 1207  Gross per 24 hour  Intake          1836.93 ml  Output             1700 ml  Net           136.93 ml   Filed Weights   12/15/2016 1612 01/05/2017 2010  Weight: 139 lb (63 kg) 138 lb 14.4 oz (63 kg)    Physical Exam    GEN: Frail appearing, in no acute distress.  HEENT: Grossly normal.  Neck: Supple, JVD difficult to assess 2/2 patient movements, no carotid bruits, or masses. Cardiac: Tachycardic, no murmurs, rubs, or gallops. No clubbing, cyanosis, edema.  Radials/DP/PT 2+ and equal bilaterally.  Respiratory:  Decreased breath sounds bilaterally. GI: Soft, nontender, nondistended, BS + x 4. MS: no deformity or atrophy. Skin: warm and dry, no rash. Neuro:  Strength and sensation are intact. Psych: Awake, anxious, sitter in room.  Labs    CBC  Recent Labs  12/25/16 0432 12/27/16 0445  WBC 12.9* 12.5*  HGB 9.9* 8.8*  HCT 29.5* 27.1*  MCV 88.5 90.6  PLT 320 291   Basic Metabolic Panel  Recent Labs  12/25/16 0432  12/26/16 0506 12/27/16 0445  NA 143  --  141 139  K 2.7*  < > 2.9* 3.4*  CL 90*  --  89* 93*  CO2 40*  --  39* 37*  GLUCOSE 142*  --  177* 124*  BUN 41*  --  39* 35*  CREATININE 1.79*  --  1.77* 1.74*  CALCIUM 9.2  --  8.9 8.7*  MG 1.6*  --  1.4* 2.4  PHOS 4.1  --  3.9  --   < > = values in this interval not displayed. Liver Function Tests No results for input(s): AST, ALT, ALKPHOS, BILITOT, PROT, ALBUMIN in the last 72 hours. No results for input(s): LIPASE, AMYLASE in the last 72 hours. Cardiac Enzymes No results for input(s): CKTOTAL, CKMB, CKMBINDEX, TROPONINI in the last 72 hours. BNP Invalid input(s): POCBNP D-Dimer No results for input(s): DDIMER in the last 72 hours. Hemoglobin A1C No results for input(s): HGBA1C in the last 72  hours. Fasting Lipid Panel No results for input(s): CHOL, HDL, LDLCALC, TRIG, CHOLHDL, LDLDIRECT in the last 72 hours. Thyroid Function Tests No results for input(s): TSH, T4TOTAL, T3FREE, THYROIDAB in the last 72 hours.  Invalid input(s): FREET3  Telemetry    Narrow complex tachycardia, 120's bpm - Personally Reviewed  ECG    n/a - Personally Reviewed  Radiology    Dg Abd 1 View  Result Date: 12/27/2016 CLINICAL DATA:  Nasogastric tube placement EXAM: ABDOMEN - 1 VIEW COMPARISON:  December 19, 2016 FINDINGS: Nasogastric tube is not seen. There is stool throughout the colon. No bowel dilatation or air-fluid level suggesting bowel obstruction. No free air. Fibrotic change noted in lung bases. There is lumbar levoscoliosis. IMPRESSION: Nasogastric tube not evident on current examination. Diffuse stool throughout colon. No bowel obstruction or free air. Stable fibrosis in the lung bases. These results will be called to the ordering clinician or representative by the Radiologist Assistant, and communication documented in the PACS or zVision Dashboard. Electronically Signed   By: Bretta BangWilliam  Woodruff III M.D.   On: 12/27/2016 10:49    Cardiac Studies   TTE 12/2016: Study Conclusions  - Left ventricle: The cavity size was normal. Systolic function was   normal. The estimated ejection fraction was in the range of 60%   to 65%. Wall motion was normal; there were no regional wall   motion abnormalities. The study is not technically sufficient to   allow evaluation of LV diastolic function. - Aortic valve: Transvalvular velocity was increased. There was   severe stenosis. There was mild regurgitation. Peak velocity (S):   482 cm/s. Mean gradient (S): 51 mm Hg. Valve area (VTI): 0.68   cm^2. - Mitral valve: There was moderate regurgitation. - Left atrium: The atrium was mildly dilated. - Right ventricle: Systolic function was normal. - Tricuspid valve: There was mild-moderate  regurgitation. - Pulmonary arteries: Systolic pressure was severely elevated. PA   peak pressure: 72 mm Hg (S).  Impressions:  - Rhythm is complete heart block.  Patient Profile     76 y.o. female presented with CHB requiring temp wire with acute respiratory failure 2/2 PNA requiring intubation, now extubated.   Assessment & Plan    1. Complete heart block: Status post venous temp wire on 2/11 for heart block heart rate of 28 with hypotension Atrial tachycardia noted after  she was extubated,  rate 116 bpm Started on esmolol infusion, eventually converted to normal sinus rhythm Esmolol weaned off secondary to bradycardia --> 80 bpm range Now back in atrial tachycardia, though BP soft in the 70's systolic with a MAP of 71 If BP allows consider re-starting esmolol infusion, d/w MD Remains on Precedex She may need esmolol prn for rate control  2. Acute respiratory failure/PNA requiring intubation: Likely multifactorial including PNA and acute diastolic CHF, made worse by severe aortic valve stenosis Responding to Lasix, post ATN diuresis Still with significant  pulmonary edema, Lasix IV twice a day started  3. Sepsis with PNA: Last week appeared to have septic picture, hypotensive, complete heart block Required 3 pressors for period Of 24-48 hours Now off pressors  4. Acute diastolic CHF: Continue gentle Lasix IV 8.1L out Creatinine improving with diuresis and improving overall status  5. Acute on chronic renal failure: Improving,followed by nephrology Suspect ATN   6. Severe aortic stenosis: -Measurements are severe on echocardiogram -Likely contributing to CHF symptoms -Will need to stabilize respiratory status,  If she recovers from current acute respiratory distress, Would need to see if her mental status improves Then would see if she would be acandidate for TAVR, she wouldneed R/LHC prior  7. Anemia: -Unable to to exclude iron deficiency vs anemia of  chronic disease -Maintain hgb > 8.5  8. Elevated troponin: -Likely supply demand ischemia 2/2 the above -May require ischemic evaluation pending her recovery     Signed, Carola Frost [facility] Pager: [phone] 12/27/2016, 12:28 PM

## 2016-12-28 DIAGNOSIS — Z515 Encounter for palliative care: Secondary | ICD-10-CM

## 2016-12-28 DIAGNOSIS — Z7189 Other specified counseling: Secondary | ICD-10-CM

## 2016-12-28 LAB — BASIC METABOLIC PANEL
ANION GAP: 9 (ref 5–15)
BUN: 39 mg/dL — ABNORMAL HIGH (ref 6–20)
CHLORIDE: 93 mmol/L — AB (ref 101–111)
CO2: 35 mmol/L — AB (ref 22–32)
CREATININE: 2.27 mg/dL — AB (ref 0.44–1.00)
Calcium: 8.5 mg/dL — ABNORMAL LOW (ref 8.9–10.3)
GFR calc non Af Amer: 20 mL/min — ABNORMAL LOW (ref >60)
GFR, EST AFRICAN AMERICAN: 23 mL/min — AB (ref >60)
GLUCOSE: 153 mg/dL — AB (ref 65–99)
Potassium: 3.1 mmol/L — ABNORMAL LOW (ref 3.5–5.1)
Sodium: 137 mmol/L (ref 135–145)

## 2016-12-28 LAB — PHOSPHORUS: Phosphorus: 5 mg/dL — ABNORMAL HIGH (ref 2.5–4.6)

## 2016-12-28 LAB — GLUCOSE, CAPILLARY
GLUCOSE-CAPILLARY: 109 mg/dL — AB (ref 65–99)
Glucose-Capillary: 154 mg/dL — ABNORMAL HIGH (ref 65–99)
Glucose-Capillary: 95 mg/dL (ref 65–99)

## 2016-12-28 LAB — MAGNESIUM: Magnesium: 2.1 mg/dL (ref 1.7–2.4)

## 2016-12-28 MED ORDER — HYDROMORPHONE HCL PF 10 MG/ML IJ SOLN
0.3000 mg/h | INTRAMUSCULAR | Status: DC
  Administered 2016-12-28: 0.3 mg/h via INTRAVENOUS
  Filled 2016-12-28: qty 2.5

## 2016-12-28 MED ORDER — SODIUM CHLORIDE 0.9 % IV SOLN
0.5000 mg/h | INTRAVENOUS | Status: DC
  Administered 2016-12-28: 1 mg/h via INTRAVENOUS
  Administered 2016-12-28: 1.5 mg/h via INTRAVENOUS
  Administered 2016-12-29 (×2): 2 mg/h via INTRAVENOUS
  Filled 2016-12-28 (×3): qty 2.5

## 2016-12-28 MED ORDER — GLYCOPYRROLATE 0.2 MG/ML IJ SOLN
0.2000 mg | INTRAMUSCULAR | Status: DC | PRN
  Administered 2016-12-29 (×2): 0.2 mg via INTRAVENOUS
  Filled 2016-12-28 (×3): qty 1

## 2016-12-28 MED ORDER — SODIUM CHLORIDE 0.9 % IV SOLN
30.0000 meq | Freq: Once | INTRAVENOUS | Status: AC
  Administered 2016-12-28: 30 meq via INTRAVENOUS
  Filled 2016-12-28: qty 15

## 2016-12-28 MED ORDER — HALOPERIDOL LACTATE 5 MG/ML IJ SOLN
2.0000 mg | Freq: Four times a day (QID) | INTRAMUSCULAR | Status: DC | PRN
  Administered 2016-12-28 – 2016-12-29 (×3): 2 mg via INTRAVENOUS
  Filled 2016-12-28 (×3): qty 1

## 2016-12-28 MED ORDER — POLYVINYL ALCOHOL 1.4 % OP SOLN
1.0000 [drp] | Freq: Four times a day (QID) | OPHTHALMIC | Status: DC | PRN
  Filled 2016-12-28: qty 15

## 2016-12-28 MED ORDER — HYDROMORPHONE BOLUS VIA INFUSION
0.5000 mg | INTRAVENOUS | Status: DC | PRN
  Administered 2016-12-28 – 2016-12-29 (×16): 1 mg via INTRAVENOUS
  Filled 2016-12-28: qty 1

## 2016-12-28 MED ORDER — BIOTENE DRY MOUTH MT LIQD: 15.0000 mL | OROMUCOSAL | Status: DC | PRN

## 2016-12-28 MED ORDER — HYDROMORPHONE BOLUS VIA INFUSION
0.5000 mg | INTRAVENOUS | Status: DC | PRN
  Administered 2016-12-28: 0.5 mg via INTRAVENOUS
  Filled 2016-12-28: qty 1

## 2016-12-28 NOTE — Progress Notes (Signed)
Dr Belia HemanKasa made aware of patients BP 77/46 (57) after given morphine and versed. Will continue to monitor.

## 2016-12-28 NOTE — Progress Notes (Signed)
Brief Nutrition Follow-up:  Pt remains NPO, confused, agitated. Continues on HFNC. Pulled out central line this AM. NG tube placement was attempted by RN x 3 yesterday and was unsuccessful.  NPO since 2/11 (9 days).  Palliative care has been consulted, awaiting determination regarding poc.  Discussed nutrition poc during ICU rounds today with MD Kasa; plan to reassess for nutrition support in 24 hours and after  Further determination regarding overall poc. If unable to obtain NG access for TF, recommend consideration of TPN (but will need to obtain central access). Continue to assess  Chloe StarcherCate Nelma Phagan MS, RD, LDN [phone] Pager  [phone] Weekend/On-Call Pager

## 2016-12-28 NOTE — Progress Notes (Signed)
[facility]    Name: Teressa Lowerdith O Ramer MRN: [id number] DOB: Sep 13, 1941    ADMISSION DATE:  12/22/2016 CONSULTATION DATE:  01/01/2017  REFERRING MD : Dr. Othella Boyerhen   SUBJ:   Patient with severe resp failure on high flow [place] 75% Remains very anxious, unable to complete ROS Versed given for anxiety Still with severe hypoxic ressp failure Unable to obtain NGT or dubhoff   VITAL SIGNS: Temp:  [97.8 F (36.6 C)-98 F (36.7 C)] 98 F (36.7 C) (02/19 1945) Pulse Rate:  [72-128] 74 (02/20 0400) Resp:  [15-25] 20 (02/20 0400) BP: (75-151)/(46-87) 100/55 (02/20 0400) SpO2:  [84 %-100 %] 98 % (02/20 0400) FiO2 (%):  [60 %-65 %] 60 % (02/20 0208) HEMODYNAMICS:   VENTILATOR SETTINGS: FiO2 (%):  [60 %-65 %] 60 % INTAKE / OUTPUT:  Intake/Output Summary (Last 24 hours) at 12/28/16 0812 Last data filed at 12/27/16 1829  Gross per 24 hour  Intake           856.78 ml  Output              875 ml  Net           -18.22 ml    Physical Examination:  Vitals:   12/28/16 0200 12/28/16 0208 12/28/16 0300 12/28/16 0400  BP: (!) 85/51  (!) 97/54 (!) 100/55  Pulse: 80  76 74  Resp: 18  19 20   Temp:      TempSrc:      SpO2: 98% 97% 99% 98%  Weight:      Height:       Lethargic, on high flow [place] HEENT WNL JVP not well visualized Scattered rales and rhonchi, no wheezes Tachy, reg, + syst M Abd soft, +BS Ext warm, no edema No focal neuro deficits  LABS: Reviewed   LABORATORY PANEL:   CBC  Recent Labs Lab 12/27/16 0445  WBC 12.5*  HGB 8.8*  HCT 27.1*  PLT 291    Chemistries   Recent Labs Lab 12/24/16 0400  12/28/16 0405  NA 147*  < > 137  K 3.6  < > 3.1*  CL 100*  < > 93*  CO2 37*  < > 35*  GLUCOSE 146*  < > 153*  BUN 50*  < > 39*  CREATININE 1.83*  < > 2.27*  CALCIUM 8.7*  < > 8.5*  MG  --   < > 2.1  PHOS  --   < > 5.0*  AST 30  --   --   ALT 34  --   --   ALKPHOS 138*  --   --   BILITOT 0.9  --   --   < > = values in this  interval not displayed.   Recent Labs Lab 12/27/16 1135 12/27/16 1613 12/27/16 1940 12/27/16 2354 12/28/16 0340 12/28/16 0744  GLUCAP 121* 139* 156* 82 154* 95   No results for input(s): PHART, PCO2ART, PO2ART in the last 168 hours.  Recent Labs Lab 12/24/16 0400  AST 30  ALT 34  ALKPHOS 138*  BILITOT 0.9  ALBUMIN 2.5*    Cardiac Enzymes  Recent Labs Lab 12/23/16 0424  TROPONINI 0.56*   PCT 2.34 >59.38> 79.11  RADIOLOGY:  CXR: improved edema pattern   ASSESSMENT/PLAN   76 year old female with severe and acute respiratory failure, with acute aspiration pneumonitis/pneumonia with severe pulmonary edema. 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  1) Acute hypoxic  respiratory failure-on high flow [place] at 75% 2) Extensive bilateral pulmonary infiltrates pulmonary edema secondary to severe aortic stenosis.continue lasix 3) Possible aspiration PNA 4) Critical aortic stenosis. If underlying physiology improves will need to have TAVR 5) CHB, resolved, presently in sinus rhythm, no additional esmolol required 6) hypokalemia, decreased 2.9 this morning we'll replace 7) AKI -  predominantly prerenal indices most likely secondary to decreased effective renal plasma flow from tight aortic stenosis 8) Severe agitated encephalopathy/delirium requiring Precedex infusion, will try to wean as tolerated  Overall prognosis is very poor, Son Buddy updated yesterday Palliative care consult pending, will need to consider hospice   Critical Care Time devoted to patient care services described in this note is 32 minutes.   Overall, patient is critically ill, prognosis is guarded.  Patient with Multiorgan failure and at high risk for cardiac arrest and death.    Lucie Leather, M.D.  [facility]  Medical Director [facility] Medical Director [facility] Cardio-Pulmonary Department

## 2016-12-28 NOTE — Consult Note (Signed)
Consultation Note Date: 12/28/2016   Patient Name: Chloe Sanchez  DOB: [birth year]  MRN: [id number]  Age / Sex: 76 y.o., female  PCP: Talbert Cage, FNP Referring Physician: Wilhelmina Mcardle, MD  Reason for Consultation: Establishing goals of care, Psychosocial/spiritual support and Terminal Care  HPI/Patient Profile: 76 y.o. female  with past medical history of arthritis, CHF, CKD, depression, diabetes, hypertension, anxiety/depression, diabetes mellitus, Sjogren's syndrome, severe polyneuropathy (requring methadone 10 mg TID) admitted on 12/30/2016 with pneumonia and course complicated by further risk of aspiration and intubation 2/11-2/13, complete heart block requiring temp pacing, newly found critical aortic stenosis, acute kidney injury, with continuing pulmonary edema. She has continued with acute delirium and AMS, requiring HCNC 40L 60%, unable to eat and numerous fails for temporary feeding tube. Now considering transition to comfort care.   Clinical Assessment and Goals of Care: I met today with Chloe Sanchez's Sanchez, Chloe Sanchez and Chloe Sanchez, at bedside. Chloe Sanchez is at home resting. They have had a chance to discuss and all agree that Chloe Sanchez is suffering. Chloe Sanchez is very tearful and emotional but agrees her mother is suffering and for transition to comfort care.   We further discussed plan to first manage her suffering/pain with infusions. Discussed expected hypotension and that our main concern would be that she is comfortable and not her vital signs or labs anymore. They recognize that she is at EOL. May discontinue cardiac/oxygen monitoring. Once more comfortable may transition out of ICU and onto floor as well as begin to titrate oxygen down (discussed with family). All questions/concerns addressed. Transition to comfort care today.   Primary Decision Maker NEXT OF KIN 3 Sanchez Chloe Sanchez, Chloe Sanchez, Chloe Sanchez   - DNR, comfort care - Begin hydromorphone infusion for comfort - Once comfortable may transition to regular floor and titrate down oxygen  Code Status/Advance Care Planning:  DNR   Symptom Management:   Pain/dyspnea/agitation: Hydromorphone 0.3 mg/hr (titrate up by 0.1 mg/hr up to max 0.6 mg/hr if > 3 bolus in 1 hour needed).   Continue Versed 2 mg every hour prn. Also adding haldol 2 mg every 6 hours prn.   Palliative Prophylaxis:   Delirium Protocol, Frequent Pain Assessment, Oral Care and Turn Reposition  Additional Recommendations (Limitations, Scope, Preferences):  Full Comfort Care  Psycho-social/Spiritual:   Desire for further Chaplaincy support:yes  Additional Recommendations: Caregiving  Support/Resources and Grief/Bereavement Support  Prognosis:   Hours - Days  Discharge Planning: Anticipated Hospital Death      Primary Diagnoses: Present on Admission: . Pneumonia   I have reviewed the medical record, interviewed the patient and family, and examined the patient. The following aspects are pertinent.  Past Medical History:  Diagnosis Date  . Anemia   . Anxiety   . Arthritis   . CHF (congestive heart failure) ([place])   . Chronic kidney disease    CKD  . Depression   . Diabetes mellitus without complication ([place])   . Gout   . Hypertension   .  Hypothyroidism   . On supplemental oxygen therapy    AT NIGHT  . Polyneuropathy ([place])   . RLS (restless legs syndrome)   . Sjogren's syndrome ([place])   . Sleep apnea    Social History   Social History  . Marital status: Widowed    Spouse name: N/A  . Number of Sanchez: N/A  . Years of education: N/A   Social History Main Topics  . Smoking status: Never Smoker  . Smokeless tobacco: Former Systems developer    Types: Snuff    Quit date: 07/16/2015  . Alcohol use No  . Drug use: No  . Sexual activity: No   Other Topics Concern  . None   Social History Narrative  . None    Family History  Problem Relation Age of Onset  . Lung cancer Sister    Scheduled Meds: . aspirin  300 mg Rectal Daily  . dextrose  1 ampule Intravenous Once  . famotidine (PEPCID) IV  20 mg Intravenous Q24H  . fentaNYL  12.5 mcg Transdermal Q72H  . heparin  5,000 Units Subcutaneous Q8H  . insulin aspart  0-9 Units Subcutaneous Q4H  . ipratropium-albuterol  3 mL Nebulization Q6H  . levothyroxine  50 mcg Intravenous Daily  . potassium chloride (KCL MULTIRUN) 30 mEq in 265 mL IVPB  30 mEq Intravenous Once  . sodium chloride flush  10-40 mL Intracatheter Q12H   Continuous Infusions: . dexmedetomidine 1.2 mcg/kg/hr (12/28/16 1031)  . dextrose 5 % 1,000 mL with potassium chloride 40 mEq infusion 50 mL/hr at 12/28/16 0800  . esmolol Stopped (12/24/16 1355)   PRN Meds:.acetaminophen, fentaNYL (SUBLIMAZE) injection, lip balm, midazolam, morphine injection No Known Allergies Review of Systems  Unable to perform ROS: Acuity of condition    Physical Exam  Constitutional: She appears well-developed.  Thin, frail  HENT:  Head: Normocephalic and atraumatic.  Cardiovascular: Tachycardia present.   Pulmonary/Chest: No accessory muscle usage. Tachypnea noted. She is in respiratory distress. She has rhonchi. She has rales.  Abdominal: Soft.  Neurological: She is disoriented.  Psychiatric: She is agitated.  Nursing note and vitals reviewed.   Vital Signs: BP 127/74   Pulse (!) 127   Temp 97.5 F (36.4 C) (Axillary)   Resp 13   Ht _0  (1.575 m)   Wt 63 kg (138 lb 14.4 oz)   SpO2 100%   BMI 25.41 kg/m  Pain Assessment: CPOT POSS *See Group Information*: 2-Acceptable,Slightly drowsy, easily aroused Pain Score: Asleep   SpO2: SpO2: 100 % O2 Device:SpO2: 100 % O2 Flow Rate: .O2 Flow Rate (L/min): 40 L/min  IO: Intake/output summary:  Intake/Output Summary (Last 24 hours) at 12/28/16 1228 Last data filed at 12/28/16 5809  Gross per 24 hour  Intake             1458 ml   Output              550 ml  Net              908 ml    LBM: Last BM Date: 12/23/16 Baseline Weight: Weight: 63 kg (139 lb) Most recent weight: Weight: 63 kg (138 lb 14.4 oz)     Palliative Assessment/Data:   Flowsheet Rows   Flowsheet Row Most Recent Value  Intake Tab  Referral Department  Critical care  Unit at Time of Referral  ICU  Palliative Care Primary Diagnosis  Cardiac  Date Notified  12/27/16  Palliative Care Type  New Palliative care  Reason for referral  Clarify Goals of Care, Counsel Regarding Hospice  Date of Admission  01/03/2017  # of days IP prior to Palliative referral  11  Clinical Assessment  Psychosocial & Spiritual Assessment  Palliative Care Outcomes      Time In/Out: 8366-2947 Time Total: 67mn Greater than 50%  of this time was spent counseling and coordinating care related to the above assessment and plan.  Signed by: AVinie Sill NP Palliative Medicine Team Pager # [phone](M-F 8a-5p) Team Phone # [phone](Nights/Weekends)

## 2016-12-28 NOTE — Progress Notes (Signed)
Patient Name: Chloe Sanchez Date of Encounter: 12/28/2016  Primary Cardiologist: [facility]  [facility]ospital Problem List     Active Problems:   Pneumonia   Complete heart block (HCC)   Acute renal failure syndrome (HCC)   Acute chronic obstructive pulmonary disease with respiratory distress (HCC)   Chronic kidney disease, stage III (moderate)   Cough   Dyspnea   Generalized weakness   Pleural effusion   Anemia in chronic kidney disease   Hypotension   Bradycardia   Respiratory failure (HCC)   Sepsis (HCC)   Seizure-like activity (HCC)   Severe aortic valve stenosis   Atrial tachycardia (HCC)     Subjective   No acute overnight events. Remains anxious. Heart rate well controlled this morning in the 70's bpm in sinus rhythm.   Inpatient Medications    Scheduled Meds: . aspirin  300 mg Rectal Daily  . ceFEPIme  1 g Intravenous Q24H  . dextrose  1 ampule Intravenous Once  . famotidine (PEPCID) IV  20 mg Intravenous Q24H  . fentaNYL  12.5 mcg Transdermal Q72H  . furosemide  40 mg Intravenous Q12H  . heparin  5,000 Units Subcutaneous Q8H  . insulin aspart  0-9 Units Subcutaneous Q4H  . ipratropium-albuterol  3 mL Nebulization Q6H  . levothyroxine  50 mcg Intravenous Daily  . mupirocin ointment  1 application Nasal BID  . sodium chloride flush  10-40 mL Intracatheter Q12H  . vancomycin  1,000 mg Intravenous Q48H   Continuous Infusions: . dexmedetomidine 1.2 mcg/kg/hr (12/28/16 0859)  . dextrose 5 % 1,000 mL with potassium chloride 40 mEq infusion 50 mL/hr at 12/28/16 0800  . esmolol Stopped (12/24/16 1355)   PRN Meds: acetaminophen, fentaNYL (SUBLIMAZE) injection, lip balm, midazolam, morphine injection   Vital Signs    Vitals:   12/28/16 0400 12/28/16 0713 12/28/16 0800 12/28/16 0830  BP: (!) 100/55  133/69 115/60  Pulse: 74  81 68  Resp: 20  17 18   Temp:   97.5 F (36.4 C)   TempSrc:   Axillary   SpO2: 98% 97% 91% 100%  Weight:        Height:        Intake/Output Summary (Last 24 hours) at 12/28/16 0949 Last data filed at 12/28/16 0816  Gross per 24 hour  Intake          1821.38 ml  Output             1125 ml  Net           696.38 ml   Filed Weights   12/18/2016 1612 12/25/2016 2010  Weight: 139 lb (63 kg) 138 lb 14.4 oz (63 kg)    Physical Exam   GEN: Frail appearing, in no acute distress.  HEENT: Grossly normal.  Neck: Supple, JVD difficult to assess 2/2 patient movements, no carotid bruits, or masses. Cardiac: RRR, no murmurs, rubs, or gallops. No clubbing, cyanosis, edema.  Radials/DP/PT 2+ and equal bilaterally.  Respiratory:  Decreased breath sounds bilaterally. GI: Soft, nontender, nondistended, BS + x 4. MS: no deformity or atrophy. Skin: warm and dry, no rash. Neuro:  Strength and sensation are intact. Psych: Awake, anxious, sitter in room.  Labs    CBC  Recent Labs  12/27/16 0445  WBC 12.5*  HGB 8.8*  HCT 27.1*  MCV 90.6  PLT 291   Basic Metabolic Panel  Recent Labs  12/26/16 0506 12/27/16 0445 12/28/16 0405  NA 141 139 137  K 2.9* 3.4* 3.1*  CL 89* 93* 93*  CO2 39* 37* 35*  GLUCOSE 177* 124* 153*  BUN 39* 35* 39*  CREATININE 1.77* 1.74* 2.27*  CALCIUM 8.9 8.7* 8.5*  MG 1.4* 2.4 2.1  PHOS 3.9  --  5.0*   Liver Function Tests No results for input(s): AST, ALT, ALKPHOS, BILITOT, PROT, ALBUMIN in the last 72 hours. No results for input(s): LIPASE, AMYLASE in the last 72 hours. Cardiac Enzymes No results for input(s): CKTOTAL, CKMB, CKMBINDEX, TROPONINI in the last 72 hours. BNP Invalid input(s): POCBNP D-Dimer No results for input(s): DDIMER in the last 72 hours. Hemoglobin A1C No results for input(s): HGBA1C in the last 72 hours. Fasting Lipid Panel No results for input(s): CHOL, HDL, LDLCALC, TRIG, CHOLHDL, LDLDIRECT in the last 72 hours. Thyroid Function Tests No results for input(s): TSH, T4TOTAL, T3FREE, THYROIDAB in the last 72 hours.  Invalid input(s):  FREET3  Telemetry    NSR, 70's bpm - Personally Reviewed  ECG    n/a - Personally Reviewed  Radiology    Dg Abd 1 View  Result Date: 12/27/2016 CLINICAL DATA:  Nasogastric tube placement EXAM: ABDOMEN - 1 VIEW COMPARISON:  12/24/16 FINDINGS: Nasogastric tube is not seen. There is stool throughout the colon. No bowel dilatation or air-fluid level suggesting bowel obstruction. No free air. Fibrotic change noted in lung bases. There is lumbar levoscoliosis. IMPRESSION: Nasogastric tube not evident on current examination. Diffuse stool throughout colon. No bowel obstruction or free air. Stable fibrosis in the lung bases. These results will be called to the ordering clinician or representative by the Radiologist Assistant, and communication documented in the PACS or zVision Dashboard. Electronically Signed   By: Bretta Bang III M.D.   On: 12/27/2016 10:49    Cardiac Studies   TTE 12/2016: Study Conclusions  - Left ventricle: The cavity size was normal. Systolic function was normal. The estimated ejection fraction was in the range of 60% to 65%. Wall motion was normal; there were no regional wall motion abnormalities. The study is not technically sufficient to allow evaluation of LV diastolic function. - Aortic valve: Transvalvular velocity was increased. There was severe stenosis. There was mild regurgitation. Peak velocity (S): 482 cm/s. Mean gradient (S): 51 mm Hg. Valve area (VTI): 0.68 cm^2. - Mitral valve: There was moderate regurgitation. - Left atrium: The atrium was mildly dilated. - Right ventricle: Systolic function was normal. - Tricuspid valve: There was mild-moderate regurgitation. - Pulmonary arteries: Systolic pressure was severely elevated. PA peak pressure: 72 mm Hg (S).  Impressions:  - Rhythm is complete heart block.  Patient Profile     76 y.o. female with history of CHB requiring temp wire with acute respiratory failure 2/2  PNA requiring intubation, now extubated.   Assessment & Plan    1.  Complete heart block: Status post venous temp wire on 2/11 for heart block heart rate of 28 with hypotension Atrial tachycardia noted after she was extubated, rate 116 bpm Started on esmolol infusion, eventually converted to normal sinus rhythm Esmolol weaned off secondary to bradycardia --> 80 bpm range Episodes of atrial tachycardia on 2/19-->now back in sinus rhythm in the 70's bpm Can use PRN IV metoprolol for rate control Remains on Precedex  2. Acute respiratory failure/PNA requiring intubation: Likely multifactorial including PNA and acute diastolic CHF, made worse by severe aortic valve stenosis Responding to Lasix, post ATN diuresis Still with significant pulmonary edema, Hold Lasix  IV this morning 2/2 worsening renal function  3. Sepsis with PNA: Last week appeared to have septic picture, hypotensive, complete heart block Required 3 pressors for period Of 24-48 hours Now off pressors  4. Acute diastolic CHF: 7.1L out Creatinine worse this morning, hold Lasix as above  5. Acute on chronic renal failure: Followed by renal  6. Severe aortic stenosis: Measurements are severe on echocardiogram -Likely contributing to CHF symptoms Will need to stabilize respiratory status If she recovers from current acute respiratory distress, Would need to see if her mental status improves Then would see if she would be acandidate for TAVR, she wouldneed R/LHC prior  7. Anemia: -Unable to to exclude iron deficiency vs anemia of chronic disease -Maintain hgb > 8.5  8. Elevated troponin: -Likely supply demand ischemia 2/2 the above -May require ischemic evaluation pending her recovery    Signed, Carola Frost [facility] Pager: [phone] 12/28/2016, 9:49 AM

## 2016-12-28 NOTE — Progress Notes (Signed)
Patient increasingly agitated in the bed; twisting around and pulling at IV line. 1mg  Dilaudid bolus given q30 mins as ordered along with 2mg  versed and 2mg  haldol; however patient remains restless and agitated.  Attempted to call the Night Team Palliative phone number with no answer.  Spoke to SheridanBincy, NP regarding patient not receiving sufficient medication for comfort.  Orders received for increased pain management.  Will continue to monitor.

## 2016-12-29 MED ORDER — HYDROMORPHONE BOLUS VIA INFUSION
1.0000 mg | INTRAVENOUS | Status: DC | PRN
  Administered 2016-12-29 (×2): 2 mg via INTRAVENOUS
  Filled 2016-12-29: qty 2

## 2016-12-29 MED ORDER — MIDAZOLAM BOLUS VIA INFUSION
2.0000 mg | INTRAVENOUS | Status: DC | PRN
  Filled 2016-12-29: qty 2

## 2016-12-29 MED ORDER — MIDAZOLAM HCL 2 MG/2ML IJ SOLN
2.0000 mg | INTRAMUSCULAR | Status: DC | PRN
  Administered 2016-12-29 (×2): 2 mg via INTRAVENOUS
  Filled 2016-12-29 (×2): qty 2

## 2016-12-29 MED ORDER — MORPHINE SULFATE (PF) 4 MG/ML IV SOLN
2.0000 mg | INTRAVENOUS | Status: DC | PRN
  Administered 2016-12-29 (×2): 4 mg via INTRAVENOUS
  Filled 2016-12-29 (×2): qty 1

## 2016-12-29 MED ORDER — SODIUM CHLORIDE 0.9 % IV SOLN
2.0000 mg/h | INTRAVENOUS | Status: DC
  Filled 2016-12-29: qty 10

## 2016-12-30 NOTE — Progress Notes (Signed)
At around 2335, Writer responded to a family's call. Noted for Patient to be non responsive, no rise and fall of chest, pulses non palpable.  Writer and Drusilla Jackson pronounced the patient death. Family member at bedside. Offered for Chaplain service, family declined. AC notified, MD also was notified.  

## 2017-01-05 ENCOUNTER — Telehealth: Payer: Self-pay | Admitting: Internal Medicine

## 2017-01-05 NOTE — Telephone Encounter (Signed)
Received Death Certificate.  Please call [facility] when ready for pick up

## 2017-01-06 NOTE — Progress Notes (Signed)
Daily Progress Note   Patient Name: Chloe Sanchez       Date: 2017-01-11 DOB: Dec 29, 1940  Age: 76 y.o. MRN#: [id number] Attending Physician: Merwyn Katos, MD Primary Care Physician: Delton Prairie, FNP Admit Date: 12/27/2016  Reason for Consultation/Follow-up: Establishing goals of care, Psychosocial/spiritual support and Terminal Care  Subjective: Ms. Houchin is actively dying but also appears uncomfortable and struggling.   Length of Stay: 13  Current Medications: Scheduled Meds:    Continuous Infusions: . dexmedetomidine Stopped (12/28/16 1456)  . HYDROmorphone 2 mg/hr (01-11-17 1540)    PRN Meds: antiseptic oral rinse, glycopyrrolate, haloperidol lactate, HYDROmorphone, lip balm, midazolam, morphine injection  Physical Exam  Constitutional: She appears well-developed. She appears toxic.  Cardiovascular: Normal rate.   Pulmonary/Chest: Accessory muscle usage present. Tachypnea noted. She is in respiratory distress. She has decreased breath sounds. She has rhonchi.  Abdominal: Soft.  Neurological: She is unresponsive.  Skin: There is pallor.  Nursing note and vitals reviewed.           Vital Signs: BP (!) 71/40 (BP Location: Right Arm)   Pulse 95   Temp 98.6 F (37 C) (Oral)   Resp (!) 24   Ht 5\' 2"  (1.575 m)   Wt 61.9 kg (136 lb 8 oz)   SpO2 (!) 59%   BMI 24.97 kg/m  SpO2: SpO2: (!) 59 % O2 Device: O2 Device: Not Delivered O2 Flow Rate: O2 Flow Rate (L/min): 30 L/min  Intake/output summary:  Intake/Output Summary (Last 24 hours) at 01-11-2017 1903 Last data filed at Jan 11, 2017 0600  Gross per 24 hour  Intake                0 ml  Output              190 ml  Net             -190 ml   LBM: Last BM Date: 12/23/16 Baseline Weight: Weight: 63 kg (139 lb) Most  recent weight: Weight: 61.9 kg (136 lb 8 oz)       Palliative Assessment/Data: PPS: 10%    Flowsheet Rows   Flowsheet Row Most Recent Value  Intake Tab  Referral Department  Critical care  Unit at Time of Referral  ICU  Palliative Care Primary Diagnosis  Cardiac  Date Notified  12/27/16  Palliative Care Type  New Palliative care  Reason for referral  Clarify Goals of Care, Counsel Regarding Hospice  Date of Admission  12/11/2016  # of days IP prior to Palliative referral  11  Clinical Assessment  Psychosocial & Spiritual Assessment  Palliative Care Outcomes      Patient Active Problem List   Diagnosis Date Noted  . Goals of care, counseling/discussion   . Palliative care encounter   . Atrial tachycardia (HCC)   . Severe aortic valve stenosis   . Respiratory failure (HCC)   . Sepsis (HCC)   . Seizure-like activity (HCC)   . Complete heart block (HCC)   . Acute renal failure syndrome (HCC)   . Acute chronic obstructive pulmonary disease with respiratory distress (HCC)   . Chronic kidney disease, stage III (moderate)   . Cough   . Dyspnea   . Generalized weakness   . Pleural effusion   . Anemia in chronic kidney disease   . Hypotension   . Bradycardia   . Pneumonia 12/09/2016  . Primary osteoarthritis of knee 07/17/2015    Palliative Care Assessment & Plan   HPI: 76 y.o. 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 She has continued with acute delirium and AMS, requiring HCNC 40L 60%, unable to eat and numerous fails for temporary feeding tube. Now considering  transition to comfort care.    Assessment: Ms. Irving BurtonHodge appears very uncomfortable. She is grunting and has severe dyspnea. Worked with RN with infusions and prns to better achieve comfort. She has severe anxiety and will likely require high and frequent doses of versed.   I also spent time with children [name] and Ben at bedside. They understand that she is actively dying. Emotional support provided.   Recommendations/Plan:  Pain/dyspnea: Continue dilaudid infusion at 2 mg/hr but increased bolus 1-2 mg every 30 min prn.  Considered versed infusion but unsure she will live long enough to benefit from this. Increased versed to 2 mg IV every 30 min prn.   Goals of Care and Additional Recommendations:  Limitations on Scope of Treatment: Full Comfort Care  Code Status:  DNR  Prognosis:   Hours - Days  Discharge Planning:  Anticipated Hospital Death  Care plan was discussed with RN  Thank you for allowing the Palliative Medicine Team to assist in the care of this patient.   Total Time 40min Prolonged Time Billed  no       Greater than 50%  of this time was spent counseling and coordinating care related to the above assessment and plan.  Yong ChannelAlicia Trevione Wert, NP Palliative Medicine Team Pager # [phone] (M-F 8a-5p) Team Phone # [phone] (Nights/Weekends)

## 2017-01-06 NOTE — Death Summary Note (Signed)
DEATH SUMMARY   Patient Details  Name: Chloe Sanchez MRN: [id number] DOB: November 10, 1940  Admission/Discharge Information   Admit Date:  26-Dec-2016  Date of Death: Date of Death: 01-09-17  Time of Death: Time of Death: 03/26/2334  Length of Stay: Mar 31, 2023  Referring Physician: Delton Prairie, FNP   Reason(s) for Hospitalization  severe AS  Diagnoses  Preliminary cause of death:  Secondary Diagnoses (including complications and co-morbidities):  Active Problems:   Pneumonia   Complete heart block (HCC)   Acute renal failure syndrome (HCC)   Acute chronic obstructive pulmonary disease with respiratory distress (HCC)   Chronic kidney disease, stage III (moderate)   Cough   Dyspnea   Generalized weakness   Pleural effusion   Anemia in chronic kidney disease   Hypotension   Bradycardia   Respiratory failure (HCC)   Sepsis (HCC)   Seizure-like activity (HCC)   Severe aortic valve stenosis   Atrial tachycardia (HCC)   Goals of care, counseling/discussion   Palliative care encounter Severe AS  Brief Hospital Course (including significant findings, care, treatment, and services provided and events leading to death)  Chloe Sanchez is a 76 y.o. year old female who was admitted to ICU 76 year old female with severe and acute respiratory failure, with acute aspiration pneumonitis/pneumonia with severe pulmonary edema. 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  1) Acute hypoxic respiratory failure-on high flow [place] at 75% 2) Extensive bilateral pulmonary infiltrates pulmonary edema secondary to severe aortic stenosis.continue lasix 3) Possible aspiration PNA 4) Critical aortic stenosis. If underlying physiology improves will need to have TAVR 5) CHB, resolved, presently in sinus rhythm, no additional esmolol required 6) hypokalemia, decreased 2.9 this morning we'll replace 7) AKI -  predominantly prerenal indices most likely secondary to  decreased effective renal plasma flow from tight aortic stenosis 8) Severe agitated encephalopathy/delirium requiring Precedex infusion, will try to wean as tolerated    Pertinent Labs and Studies  Significant Diagnostic Studies Dg Chest 1 View  Result Date: 12/20/2016 CLINICAL DATA:  Shortness of breath . EXAM: CHEST 1 VIEW COMPARISON:  12/12/2016 . FINDINGS: Interim placement of NG tube, its tip is below the left hemidiaphragm. Endotracheal tube, right IJ line stable position. Cardiomegaly with bilateral pulmonary alveolar infiltrates consistent with CHF. Bilateral pneumonia cannot be excluded. No pneumothorax. IMPRESSION: 1. Interim placement of NG tube, its tip is below the left hemidiaphragm. Endotracheal tube and right IJ line in stable position. 2. Cardiomegaly with bilateral pulmonary infiltrates consistent pulmonary edema. Bilateral pneumonia cannot be excluded. No change from prior exam . Electronically Signed   By: Maisie Fus  Register   On: 12/20/2016 06:55   Dg Chest 1 View  Result Date: 12/28/2016 CLINICAL DATA:  Status post intubation and central line placement EXAM: CHEST 1 VIEW COMPARISON:  12/10/2016 FINDINGS: Cardiac shadow is stable. Diffuse bilateral airspace disease is again seen. Endotracheal tube is again seen in satisfactory position. Left subclavian central line extends in the left internal jugular vein stable from the prior study. No acute bony abnormality is noted. IMPRESSION: Stable bilateral airspace disease. Stable appearance of endotracheal tube and left subclavian vein central catheter with the tip in the left internal jugular vein Electronically Signed   By: Alcide Clever M.D.   On: 12/27/2016 19:23   Dg Chest 1 View  Result Date: 12/20/2016 CLINICAL DATA:  Dyspnea per physician order. Last night seizure-like activity with vomiting and shortness of breath, Pt was admitted 12-26-2016 for  bilateral chest wall pain, shortness of breath, dyspnea on exertion, and generalized  weakness. EXAM: CHEST 1 VIEW COMPARISON:  12/18/2016 FINDINGS: There are bilateral interstitial and alveolar airspace opacities involving the upper and lower lungs. There are trace bilateral pleural effusions. There is no pneumothorax. There is stable cardiomegaly. The osseous structures are unremarkable. IMPRESSION: Bilateral interstitial and alveolar airspace opacities throughout bilateral lungs which may reflect florid pulmonary edema versus multi lobar pneumonia. Electronically Signed   By: Elige Ko   On: 01/05/2017 13:15   Dg Chest 2 View  Result Date: 12/28/2016 CLINICAL DATA:  LEFT chest pain beginning a few days ago. Dizziness and shortness of breath. History of CHF, diabetes. EXAM: CHEST  2 VIEW COMPARISON:  Chest radiograph December 29, 2015 FINDINGS: Cardiomediastinal silhouette is normal. Mildly calcified aortic knob. Increasing mild interstitial prominence with faint RIGHT upper lobe airspace opacities. Similar bibasilar strandy densities. Flattened hemidiaphragms. No pleural effusion. No pneumothorax. Osteopenia. Broad dextroscoliosis and mild degenerative change of the spine. Old LEFT rib fractures. Surgical clips in the included right abdomen compatible with cholecystectomy. IMPRESSION: Increasing interstitial prominence with patchy RIGHT upper lobe airspace opacities concerning for bronchopneumonia. Followup PA and lateral chest X-ray is recommended in 3-4 weeks following trial of antibiotic therapy to ensure resolution and exclude underlying malignancy. Similar bibasilar atelectasis/scarring. Electronically Signed   By: Awilda Metro M.D.   On: 12/15/2016 16:57   Dg Abd 1 View  Result Date: 12/27/2016 CLINICAL DATA:  Nasogastric tube placement EXAM: ABDOMEN - 1 VIEW COMPARISON:  December 19, 2016 FINDINGS: Nasogastric tube is not seen. There is stool throughout the colon. No bowel dilatation or air-fluid level suggesting bowel obstruction. No free air. Fibrotic change noted in lung  bases. There is lumbar levoscoliosis. IMPRESSION: Nasogastric tube not evident on current examination. Diffuse stool throughout colon. No bowel obstruction or free air. Stable fibrosis in the lung bases. These results will be called to the ordering clinician or representative by the Radiologist Assistant, and communication documented in the PACS or zVision Dashboard. Electronically Signed   By: Bretta Bang III M.D.   On: 12/27/2016 10:49   Dg Abd 1 View  Result Date: 12/21/2016 CLINICAL DATA:  OG tube placement EXAM: ABDOMEN - 1 VIEW COMPARISON:  None. FINDINGS: Enteric tube with tip in the left upper quadrant consistent with location in the body of the stomach. Right femoral venous catheter with tip over the inferior vena caval atrial junction. Scattered gas and stool throughout the colon. No bowel distention. Atelectasis or infiltration seen in the lung bases. Lumbar scoliosis convex towards the left with degenerative change. Aortic calcification. IMPRESSION: Enteric tube tip is in the left upper quadrant consistent with location in the body of the stomach. Electronically Signed   By: Burman Nieves M.D.   On: 12/28/2016 23:21   Ct Head Wo Contrast  Result Date: 12/18/2016 CLINICAL DATA:  Acute onset of stroke-like symptoms. Difficulty with speech, and possible seizure like activity. Initial encounter. EXAM: CT HEAD WITHOUT CONTRAST TECHNIQUE: Contiguous axial images were obtained from the base of the skull through the vertex without intravenous contrast. COMPARISON:  CT of the head performed 12/08/2016 FINDINGS: Brain: No evidence of acute infarction, hemorrhage, hydrocephalus, extra-axial collection or mass lesion/mass effect. Prominence of the sulci suggests mild cortical volume loss. Mild cerebellar atrophy is noted. Mild periventricular white matter change likely reflects small vessel ischemic microangiopathy. The brainstem and fourth ventricle are within normal limits. The basal ganglia are  unremarkable in appearance. The cerebral hemispheres  demonstrate grossly normal gray-white differentiation. No mass effect or midline shift is seen. Vascular: No hyperdense vessel or unexpected calcification. Skull: There is no evidence of fracture; visualized osseous structures are unremarkable in appearance. Sinuses/Orbits: The visualized portions of the orbits are within normal limits. The paranasal sinuses and mastoid air cells are well-aerated. Other: No significant soft tissue abnormalities are seen. IMPRESSION: 1. No acute intracranial pathology seen on CT. 2. Mild cortical volume loss and scattered small vessel ischemic microangiopathy. Electronically Signed   By: Roanna RaiderJeffery  Chang M.D.   On: 12/18/2016 17:44   Ct Head Wo Contrast  Result Date: 12/08/2016 CLINICAL DATA:  Larey SeatFell in the bedroom, striking her head. Hematoma posteriorly. EXAM: CT HEAD WITHOUT CONTRAST CT CERVICAL SPINE WITHOUT CONTRAST TECHNIQUE: Multidetector CT imaging of the head and cervical spine was performed following the standard protocol without intravenous contrast. Multiplanar CT image reconstructions of the cervical spine were also generated. COMPARISON:  None. FINDINGS: CT HEAD FINDINGS Brain: There is no intracranial hemorrhage, mass or evidence of acute infarction. There is mild chronic microvascular ischemic change. There is no significant extra-axial fluid collection. No acute intracranial findings are evident. Vascular: No hyperdense vessel or unexpected calcification. Skull: Normal. Negative for fracture or focal lesion. Sinuses/Orbits: No acute finding. Other: None. CT CERVICAL SPINE FINDINGS Alignment: Normal. Skull base and vertebrae: No acute fracture. No primary bone lesion or focal pathologic process. Soft tissues and spinal canal: No prevertebral fluid or swelling. No visible canal hematoma. Disc levels: Moderate degenerative cervical disc disease at C5-6 and C6-7. Slight anterolisthesis at Celsius 3 4 and C4-5, likely  degenerative. Moderate cervical facet arthritis. Upper chest: Negative. Other: None IMPRESSION: 1. No acute intracranial findings. There is mild chronic appearing white matter hypodensities which likely represent small vessel ischemic disease. 2. Negative for acute cervical spine fracture Electronically Signed   By: Ellery Plunkaniel R Mitchell M.D.   On: 12/08/2016 22:17   Ct Cervical Spine Wo Contrast  Result Date: 12/08/2016 CLINICAL DATA:  Larey SeatFell in the bedroom, striking her head. Hematoma posteriorly. EXAM: CT HEAD WITHOUT CONTRAST CT CERVICAL SPINE WITHOUT CONTRAST TECHNIQUE: Multidetector CT imaging of the head and cervical spine was performed following the standard protocol without intravenous contrast. Multiplanar CT image reconstructions of the cervical spine were also generated. COMPARISON:  None. FINDINGS: CT HEAD FINDINGS Brain: There is no intracranial hemorrhage, mass or evidence of acute infarction. There is mild chronic microvascular ischemic change. There is no significant extra-axial fluid collection. No acute intracranial findings are evident. Vascular: No hyperdense vessel or unexpected calcification. Skull: Normal. Negative for fracture or focal lesion. Sinuses/Orbits: No acute finding. Other: None. CT CERVICAL SPINE FINDINGS Alignment: Normal. Skull base and vertebrae: No acute fracture. No primary bone lesion or focal pathologic process. Soft tissues and spinal canal: No prevertebral fluid or swelling. No visible canal hematoma. Disc levels: Moderate degenerative cervical disc disease at C5-6 and C6-7. Slight anterolisthesis at Celsius 3 4 and C4-5, likely degenerative. Moderate cervical facet arthritis. Upper chest: Negative. Other: None IMPRESSION: 1. No acute intracranial findings. There is mild chronic appearing white matter hypodensities which likely represent small vessel ischemic disease. 2. Negative for acute cervical spine fracture Electronically Signed   By: Ellery Plunkaniel R Mitchell M.D.   On:  12/08/2016 22:17   Mr Brain Wo Contrast  Result Date: 12/18/2016 CLINICAL DATA:  Episode of seizure like activity. Slurred speech. Tremors. Disorientation. EXAM: MRI HEAD WITHOUT CONTRAST TECHNIQUE: Multiplanar, multiecho pulse sequences of the brain and surrounding structures were obtained without intravenous contrast.  COMPARISON:  Head CT same day.  Head CT 12/08/2016. FINDINGS: Brain: Diffusion imaging does not show any acute or subacute infarction. The study does suffer from considerable motion degradation. The brainstem is negative. There are old small vessel infarctions affecting the cerebellum bilaterally. Cerebral hemispheres show moderate chronic small-vessel ischemic changes of the deep white matter. No large vessel territory infarction. No mass lesion, hemorrhage, hydrocephalus or extra-axial collection. No pituitary mass. Vascular: Major vessels at the base of the brain show flow. Skull and upper cervical spine: Negative Sinuses/Orbits: Clear/normal Other: None significant IMPRESSION: No acute finding. 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 Moderate chronic small-vessel ischemic changes of the cerebral hemispheric white matter. No specific cause of seizure identified. These results will be called to the ordering clinician or representative by the Radiologist Assistant, and communication documented in the PACS or zVision Dashboard. Electronically Signed   By: Paulina Fusi M.D.   On: 12/18/2016 20:56   US Renal  Result Date: 2017-01-12 CLINICAL DATA:  Chronic kidney disease EXAM: RENAL / URINARY TRACT ULTRASOUND COMPLETE COMPARISON:  None. FINDINGS: Right Kidney: Length: 8.4 cm. No renal mass or hydronephrosis. Renal cortical thinning with increased echogenicity. Left Kidney: Length: 9.2 cm. No renal mass or hydronephrosis. Renal cortical thinning with increased echogenicity. Bladder: Foley catheter in place.  Decompressed. IMPRESSION: 1. Atrophic bilateral kidneys with renal  cortical thinning and increased renal echogenicity as can be seen with medical renal disease. Electronically Signed   By: Elige Ko   On: 2017-01-12 13:20   Dg Chest Port 1 View  Result Date: 12/24/2016 CLINICAL DATA:  Respiratory failure EXAM: PORTABLE CHEST 1 VIEW COMPARISON:  12/23/2016 FINDINGS: Interstitial edema with more confluent airspace opacities at each lung base consistent with pulmonary edema, superimposed pneumonia not with standing. Cardiomegaly is stable. There is aortic atherosclerosis. Right IJ l catheter with tip in the distal SVC. No acute osseous abnormality. Old left-sided rib fractures. Hazy opacity at each lung base may also be secondary to layering small effusions. IMPRESSION: Findings in keeping with pulmonary edema. More confluent airspace opacities at each lung base since prior exam cannot exclude pneumonia. Probable bilateral layering effusions. Electronically Signed   By: Tollie Eth M.D.   On: 12/24/2016 02:50   Dg Chest Port 1 View  Result Date: 12/23/2016 CLINICAL DATA:  Respiratory failure EXAM: PORTABLE CHEST 1 VIEW COMPARISON:  12/22/2016 FINDINGS: Stable cardiomegaly with aortic atherosclerosis. Right IJ line terminates in the distal SVC. Emphysematous hyperinflation of the left upper lobe and right mid lung with adjacent areas of confluent airspace opacities consistent with pulmonary edema. Superimposed pneumonia is not entirely excluded. Cardiac monitoring leads project over the thorax and epigastric region. No acute nor suspicious osseous lesions. IMPRESSION: Diffuse airspace opacities consistent with pulmonary edema. Superimposed pneumonia cannot entirely excluded. There is sparing of the left upper lobe and right mid lung likely from emphysematous hyperinflation. Electronically Signed   By: Tollie Eth M.D.   On: 12/23/2016 03:38   Dg Chest Port 1 View  Result Date: 12/22/2016 CLINICAL DATA:  Respiratory failure EXAM: PORTABLE CHEST 1 VIEW COMPARISON:   12/21/2016 FINDINGS: Endotracheal tube and nasogastric catheter been removed in the interval. Right jugular central line in temporary pacemaker are again seen and stable. Cardiac shadow remains mildly enlarged. Diffuse bilateral infiltrates are again seen known somewhat increased particularly in the left mid lung. The bony structures are within normal limits. IMPRESSION: Diffuse bilateral infiltrates increased on the left. Electronically Signed   By: Eulah Pont.D.  On: 12/22/2016 07:08   Dg Chest Port 1 View  Result Date: 12/21/2016 CLINICAL DATA:  Respiratory failure. EXAM: PORTABLE CHEST 1 VIEW COMPARISON:  12/20/2016. FINDINGS: Endotracheal tube, right IJ line, NG tube in stable position. Heart size stable. Prominent bilateral pulmonary infiltrates again noted. No pleural effusion. No pneumothorax. IMPRESSION: 1.  Lines and tubes in stable position. 2. Stable cardiomegaly. Prominent bilateral pulmonary infiltrates/edema again noted. No significant interim change. Electronically Signed   By: Maisie Fus  Register   On: 12/21/2016 06:39   Dg Chest Port 1 View  Result Date: 12/28/2016 CLINICAL DATA:  Central line placement EXAM: PORTABLE CHEST 1 VIEW COMPARISON:  Film from earlier in the same day FINDINGS: Bilateral airspace disease is again identified. The endotracheal tube is in satisfactory position. The previously seen left subclavian central line has been removed. A new right jugular central line is noted in satisfactory position. No pneumothorax is noted. IMPRESSION: Status post right jugular central line placement in satisfactory position. The remainder of the exam is stable. Electronically Signed   By: Alcide Clever M.D.   On: 12/31/2016 21:14   Dg Chest Port 1 View  Result Date: 12/15/2016 CLINICAL DATA:  Endotracheal tube placement. Central line placement. EXAM: PORTABLE CHEST 1 VIEW COMPARISON:  Earlier same day FINDINGS: Defibrillator pads overlie the chest. Pacemaker has been placed from an  inferior approach with the tip in the region of the right ventricle. Endotracheal tube has its tip 5 cm above the carina. Left subclavian central line turns up the left internal jugular vein, tip not visible. Diffuse airspace density persists but is improved. IMPRESSION: Less diffuse airspace density/ improved pulmonary aeration. Endotracheal tube tip 5 cm above the carina. Left subclavian central line turns up the left IJ, tip not visible. Pacemaker placed from below has the lead tip in the region of the right ventricle. These results will be called to the ordering clinician or representative by the Radiologist Assistant, and communication documented in the PACS or zVision Dashboard. Electronically Signed   By: Paulina Fusi M.D.   On: 01/03/2017 18:46   Dg Chest Port 1 View  Result Date: 12/18/2016 CLINICAL DATA:  Cough. EXAM: PORTABLE CHEST 1 VIEW COMPARISON:  12/28/2016 FINDINGS: Borderline heart size with normal pulmonary vascularity. Patchy airspace infiltrates are demonstrated in the right upper lung and left perihilar region as well as in the lung bases. There is progression since previous study. This likely represents multifocal pneumonia. Edema less likely. No blunting of costophrenic angles. No pneumothorax. Tortuous aorta. Degenerative changes in the spine. IMPRESSION: Increasing airspace disease in both lungs suggesting multifocal pneumonia. Electronically Signed   By: Burman Nieves M.D.   On: 12/18/2016 00:23    Microbiology No results found for this or any previous visit (from the past 240 hour(s)).  Lab Basic Metabolic Panel:  Recent Labs Lab 12/25/16 0432 12/25/16 1417 12/26/16 0506 12/27/16 0445 12/28/16 0405  NA 143  --  141 139 137  K 2.7* 3.1* 2.9* 3.4* 3.1*  CL 90*  --  89* 93* 93*  CO2 40*  --  39* 37* 35*  GLUCOSE 142*  --  177* 124* 153*  BUN 41*  --  39* 35* 39*  CREATININE 1.79*  --  1.77* 1.74* 2.27*  CALCIUM 9.2  --  8.9 8.7* 8.5*  MG 1.6*  --  1.4* 2.4 2.1    PHOS 4.1  --  3.9  --  5.0*   Liver Function Tests: No results for input(s): AST, ALT,  ALKPHOS, BILITOT, PROT, ALBUMIN in the last 168 hours. No results for input(s): LIPASE, AMYLASE in the last 168 hours. No results for input(s): AMMONIA in the last 168 hours. CBC:  Recent Labs Lab 12/25/16 0432 12/27/16 0445  WBC 12.9* 12.5*  HGB 9.9* 8.8*  HCT 29.5* 27.1*  MCV 88.5 90.6  PLT 320 291   Cardiac Enzymes: No results for input(s): CKTOTAL, CKMB, CKMBINDEX, TROPONINI in the last 168 hours. Sepsis Labs:  Recent Labs Lab 12/25/16 0432 12/27/16 0445  WBC 12.9* 12.5*      Chloe Sanchez 12/31/2016, 11:35 AM

## 2017-01-06 NOTE — Progress Notes (Signed)
Nutrition Brief Note  Chart reviewed. Pt now transitioning to comfort care. NPO. No further nutrition interventions warranted at this time.  Please re-consult as needed.   Romelle Starcherate Maylea Soria MS, RD, LDN [phone] Pager  [phone] Weekend/On-Call Pager

## 2017-01-06 DEATH — deceased

## 2017-01-12 DIAGNOSIS — Z515 Encounter for palliative care: Secondary | ICD-10-CM

## 2018-07-30 IMAGING — CT CT CERVICAL SPINE W/O CM
4 of 8 series · 11 of 33 positions shown, 12 images · non-contrast
Comparison: None.

CLINICAL DATA: Fell in the bedroom, striking her head. Hematoma
posteriorly.

EXAM:
CT HEAD WITHOUT CONTRAST
CT CERVICAL SPINE WITHOUT CONTRAST
TECHNIQUE: Multidetector CT imaging of the head and cervical spine was
performed following the standard protocol without intravenous
contrast. Multiplanar CT image reconstructions of the cervical spine
were also generated.

[Series 5: coronal soft tissue · coronal · 0.28mm/px · 3 of 61 slices shown]
[im 16/61  bone]
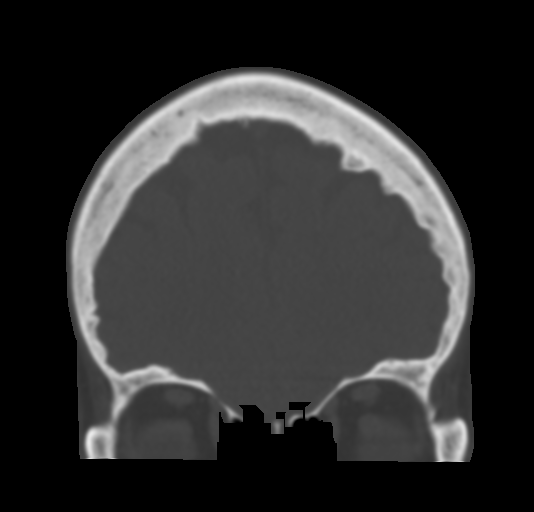
[im 31/61  bone]
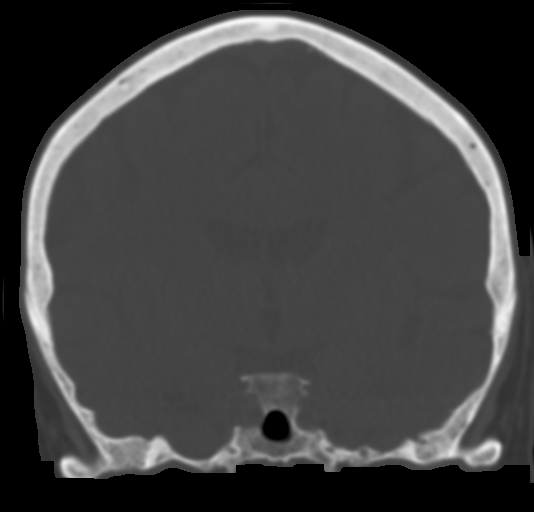
[im 46/61  bone]
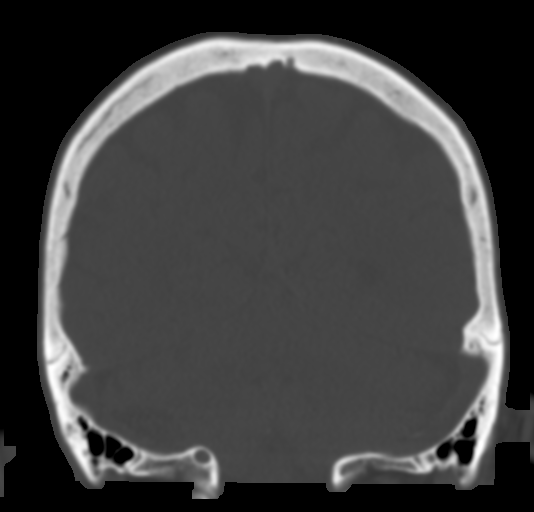

[Series 7: c spine soft · axial · 0.34mm/px · 1 of 80 slices shown]
[im 27/80  soft-tissue]
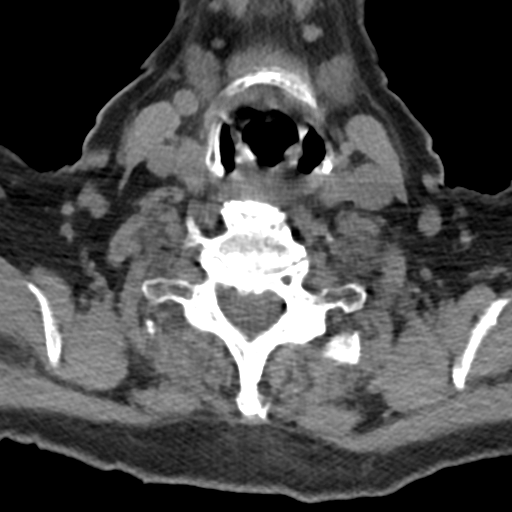

[Series 9: sagittal bone · sagittal · 0.19mm/px · 4 of 42 slices shown]
[im 9/42  bone]
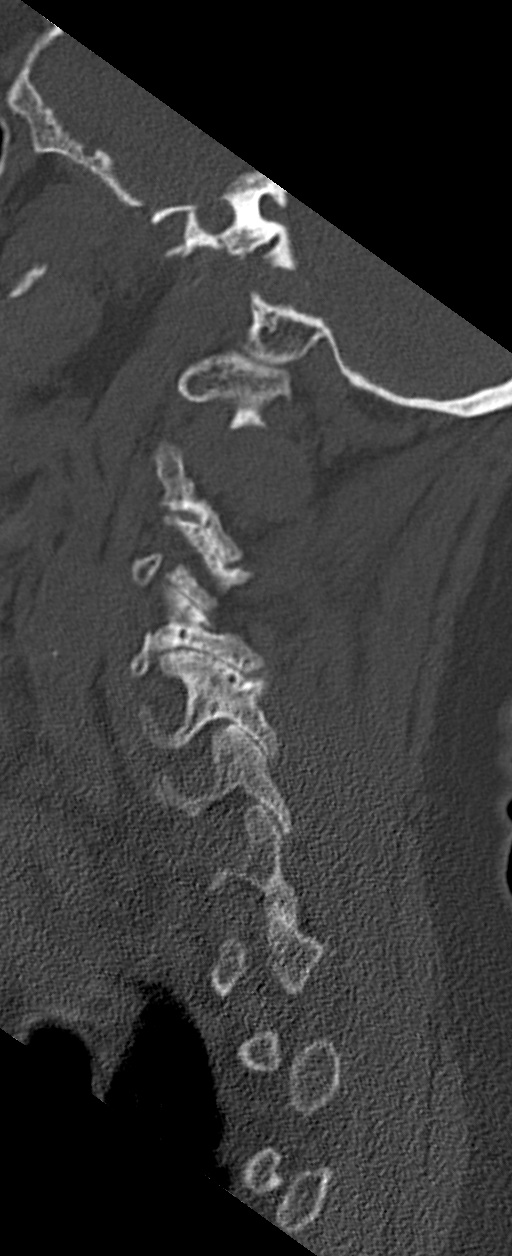
[im 17/42  bone]
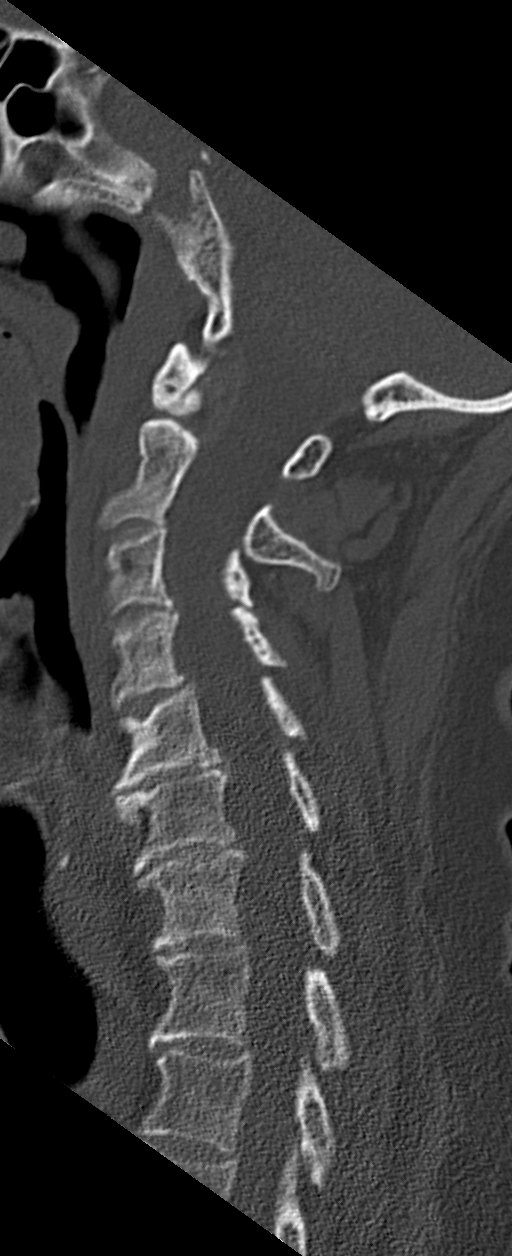
[im 25/42  bone]
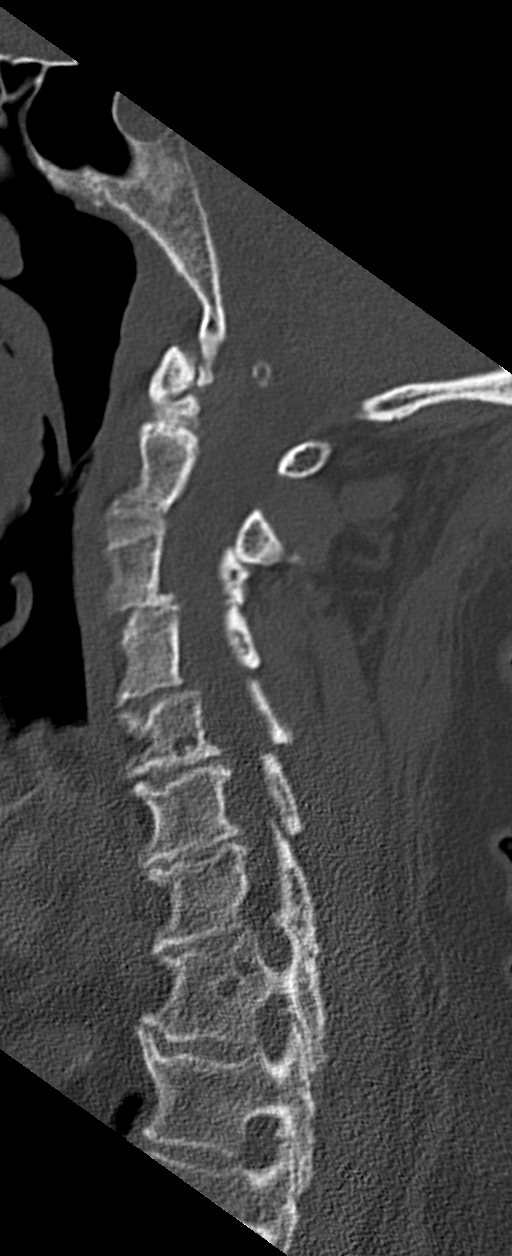
[im 33/42  bone]
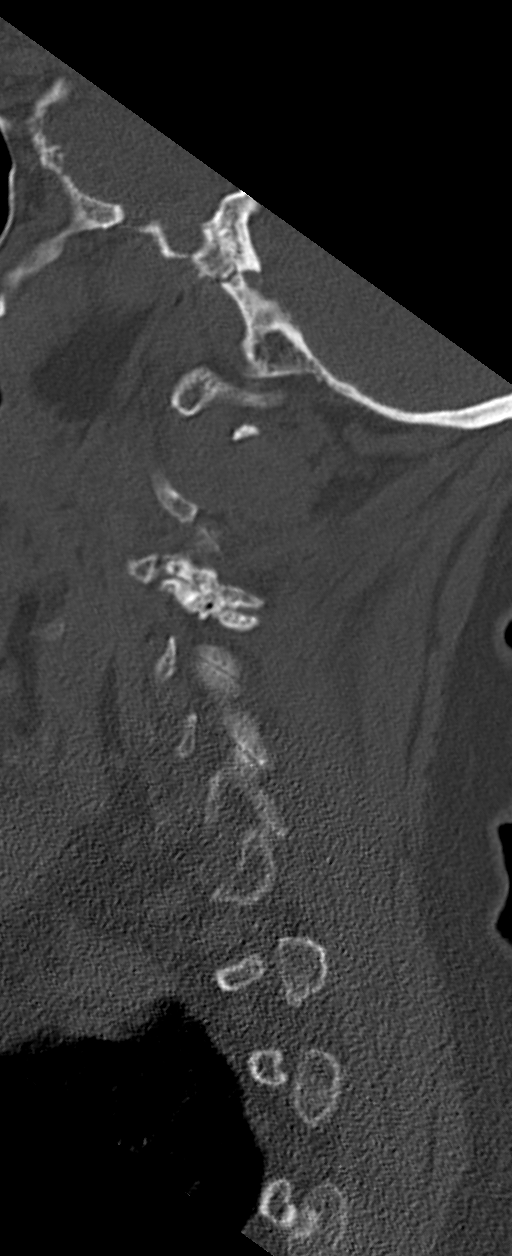

[Series 11: orthogonal bone · axial · 0.25mm/px · z∈[-269,-187]mm · 3 of 96 slices shown, 4 images]
[im 24/96  soft-tissue]
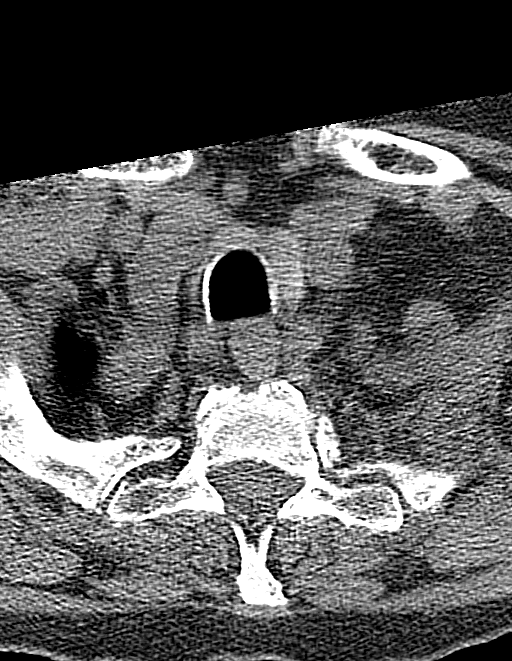
[im 24/96  bone]
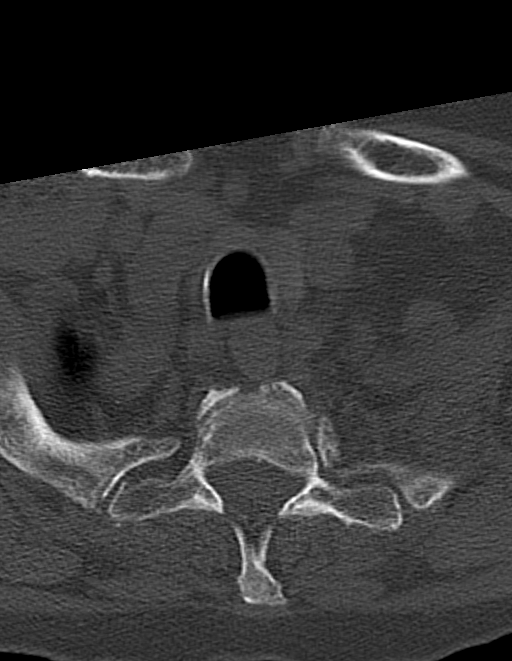
[im 48/96  bone]
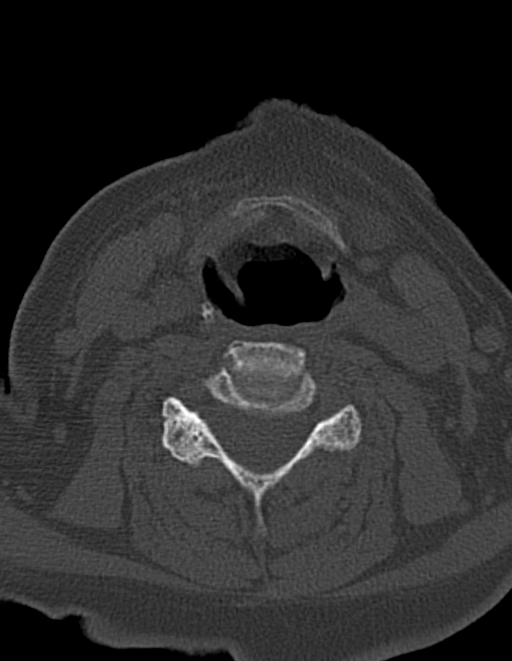
[im 72/96  bone]
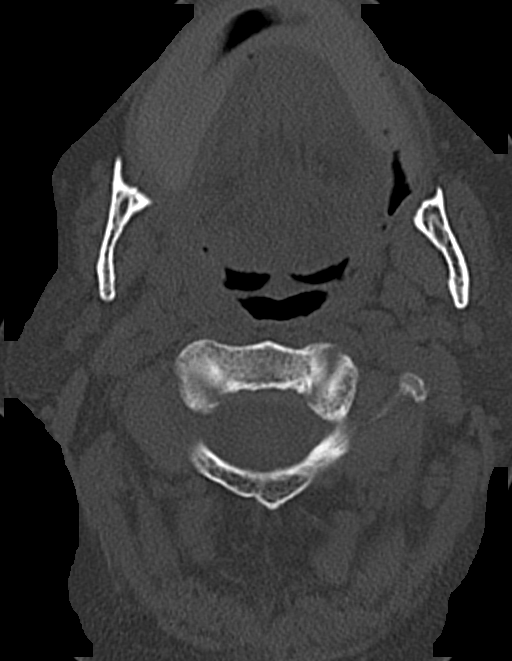

[11 of 33 positions shown; findings below may reference images not displayed]

FINDINGS: CT HEAD FINDINGS

Brain:

There is no intracranial hemorrhage, mass or evidence of acute
infarction. There is mild chronic microvascular ischemic change.
There is no significant extra-axial fluid collection.

No acute intracranial findings are evident.

Vascular: No hyperdense vessel or unexpected calcification.

Skull: Normal. Negative for fracture or focal lesion.

Sinuses/Orbits: No acute finding.

Other: None.

CT CERVICAL SPINE FINDINGS

Alignment: Normal.

Skull base and vertebrae: No acute fracture. No primary bone lesion
or focal pathologic process.

Soft tissues and spinal canal: No prevertebral fluid or swelling. No
visible canal hematoma.

Disc levels: Moderate degenerative cervical disc disease at C5-6 and
C6-7. Slight anterolisthesis at Celsius 3 4 and C4-5, likely
degenerative. Moderate cervical facet arthritis.

Upper chest: Negative.

Other: None
IMPRESSION: 1. No acute intracranial findings. There is mild chronic appearing
white matter hypodensities which likely represent small vessel
ischemic disease.
2. Negative for acute cervical spine fracture
